# Patient Record
Sex: Male | Born: 1945
Health system: Southern US, Community
[De-identification: ages and names within clinical notes are randomized; demographics above are authoritative.]

## PROBLEM LIST (undated history)

## (undated) DIAGNOSIS — I10 Essential (primary) hypertension: Secondary | ICD-10-CM

## (undated) DIAGNOSIS — D649 Anemia, unspecified: Secondary | ICD-10-CM

## (undated) DIAGNOSIS — E119 Type 2 diabetes mellitus without complications: Secondary | ICD-10-CM

## (undated) DIAGNOSIS — I213 ST elevation (STEMI) myocardial infarction of unspecified site: Secondary | ICD-10-CM

## (undated) DIAGNOSIS — K5790 Diverticulosis of intestine, part unspecified, without perforation or abscess without bleeding: Secondary | ICD-10-CM

## (undated) DIAGNOSIS — E785 Hyperlipidemia, unspecified: Secondary | ICD-10-CM

## (undated) DIAGNOSIS — F17200 Nicotine dependence, unspecified, uncomplicated: Secondary | ICD-10-CM

## (undated) DIAGNOSIS — I219 Acute myocardial infarction, unspecified: Secondary | ICD-10-CM

## (undated) DIAGNOSIS — J449 Chronic obstructive pulmonary disease, unspecified: Secondary | ICD-10-CM

## (undated) DIAGNOSIS — E1169 Type 2 diabetes mellitus with other specified complication: Secondary | ICD-10-CM

## (undated) DIAGNOSIS — K635 Polyp of colon: Secondary | ICD-10-CM

## (undated) HISTORY — DX: Polyp of colon: K63.5

## (undated) HISTORY — DX: Diverticulosis of intestine, part unspecified, without perforation or abscess without bleeding: K57.90

## (undated) HISTORY — DX: ST elevation (STEMI) myocardial infarction of unspecified site: I21.3

## (undated) HISTORY — DX: Anemia, unspecified: D64.9

## (undated) HISTORY — DX: Hyperlipidemia, unspecified: E78.5

## (undated) HISTORY — DX: Nicotine dependence, unspecified, uncomplicated: F17.200

## (undated) HISTORY — DX: Chronic obstructive pulmonary disease, unspecified: J44.9

## (undated) HISTORY — DX: Type 2 diabetes mellitus with other specified complication: E11.69

## (undated) HISTORY — DX: Type 2 diabetes mellitus without complications: E11.9

---

## 2003-02-06 ENCOUNTER — Encounter: Payer: Self-pay | Admitting: Gastroenterology

## 2003-02-06 ENCOUNTER — Ambulatory Visit (HOSPITAL_COMMUNITY): Admission: RE | Admit: 2003-02-06 | Discharge: 2003-02-06 | Payer: Self-pay | Admitting: Gastroenterology

## 2004-06-20 ENCOUNTER — Ambulatory Visit: Payer: Self-pay | Admitting: Family Medicine

## 2004-12-15 ENCOUNTER — Ambulatory Visit: Payer: Self-pay | Admitting: Family Medicine

## 2005-01-01 ENCOUNTER — Ambulatory Visit: Payer: Self-pay | Admitting: Family Medicine

## 2005-01-26 ENCOUNTER — Ambulatory Visit: Payer: Self-pay | Admitting: Family Medicine

## 2005-04-16 ENCOUNTER — Ambulatory Visit: Payer: Self-pay | Admitting: Family Medicine

## 2005-04-27 ENCOUNTER — Ambulatory Visit: Payer: Self-pay | Admitting: Family Medicine

## 2005-05-28 ENCOUNTER — Ambulatory Visit: Payer: Self-pay | Admitting: Family Medicine

## 2005-06-11 ENCOUNTER — Ambulatory Visit: Payer: Self-pay | Admitting: Family Medicine

## 2005-09-17 ENCOUNTER — Ambulatory Visit: Payer: Self-pay | Admitting: Family Medicine

## 2006-02-03 ENCOUNTER — Ambulatory Visit: Payer: Self-pay | Admitting: Gastroenterology

## 2006-02-16 ENCOUNTER — Ambulatory Visit: Payer: Self-pay | Admitting: Gastroenterology

## 2006-02-16 ENCOUNTER — Encounter: Payer: Self-pay | Admitting: Gastroenterology

## 2006-05-28 ENCOUNTER — Ambulatory Visit: Payer: Self-pay | Admitting: Family Medicine

## 2006-06-01 ENCOUNTER — Ambulatory Visit: Payer: Self-pay | Admitting: Family Medicine

## 2006-12-14 ENCOUNTER — Ambulatory Visit: Payer: Self-pay | Admitting: Family Medicine

## 2007-01-06 ENCOUNTER — Ambulatory Visit: Payer: Self-pay | Admitting: Family Medicine

## 2010-05-20 ENCOUNTER — Emergency Department (HOSPITAL_COMMUNITY): Admission: EM | Admit: 2010-05-20 | Discharge: 2010-05-20 | Payer: Self-pay | Admitting: Emergency Medicine

## 2010-10-08 LAB — URINALYSIS, ROUTINE W REFLEX MICROSCOPIC
Bilirubin Urine: NEGATIVE
Glucose, UA: 500 mg/dL — AB
Nitrite: NEGATIVE
Specific Gravity, Urine: 1.02 (ref 1.005–1.030)
pH: 6 (ref 5.0–8.0)

## 2010-10-08 LAB — URINE MICROSCOPIC-ADD ON

## 2011-03-25 ENCOUNTER — Encounter: Payer: Self-pay | Admitting: Gastroenterology

## 2013-05-30 ENCOUNTER — Encounter: Payer: Self-pay | Admitting: *Deleted

## 2013-06-01 ENCOUNTER — Encounter: Payer: Self-pay | Admitting: *Deleted

## 2016-01-24 ENCOUNTER — Encounter: Payer: Self-pay | Admitting: Gastroenterology

## 2016-03-06 DIAGNOSIS — I152 Hypertension secondary to endocrine disorders: Secondary | ICD-10-CM | POA: Insufficient documentation

## 2016-03-06 DIAGNOSIS — F17210 Nicotine dependence, cigarettes, uncomplicated: Secondary | ICD-10-CM | POA: Insufficient documentation

## 2016-03-06 DIAGNOSIS — E1159 Type 2 diabetes mellitus with other circulatory complications: Secondary | ICD-10-CM | POA: Insufficient documentation

## 2016-03-06 DIAGNOSIS — Z87891 Personal history of nicotine dependence: Secondary | ICD-10-CM | POA: Insufficient documentation

## 2016-05-28 ENCOUNTER — Encounter (HOSPITAL_COMMUNITY)
Admission: EM | Disposition: A | Discharge status: Home / Self Care | Payer: Self-pay | Source: Ambulatory Visit | Attending: Internal Medicine

## 2016-05-28 ENCOUNTER — Inpatient Hospital Stay (HOSPITAL_COMMUNITY)
Admission: EM | Admit: 2016-05-28 | Discharge: 2016-05-31 | DRG: 247 | Disposition: A | Discharge status: Home / Self Care | Payer: Medicare Other | Source: Ambulatory Visit | Attending: Internal Medicine | Admitting: Internal Medicine

## 2016-05-28 DIAGNOSIS — I10 Essential (primary) hypertension: Secondary | ICD-10-CM | POA: Diagnosis present

## 2016-05-28 DIAGNOSIS — I2510 Atherosclerotic heart disease of native coronary artery without angina pectoris: Secondary | ICD-10-CM | POA: Diagnosis present

## 2016-05-28 DIAGNOSIS — R001 Bradycardia, unspecified: Secondary | ICD-10-CM | POA: Diagnosis present

## 2016-05-28 DIAGNOSIS — I2111 ST elevation (STEMI) myocardial infarction involving right coronary artery: Secondary | ICD-10-CM

## 2016-05-28 DIAGNOSIS — Z794 Long term (current) use of insulin: Secondary | ICD-10-CM | POA: Diagnosis not present

## 2016-05-28 DIAGNOSIS — I219 Acute myocardial infarction, unspecified: Secondary | ICD-10-CM

## 2016-05-28 DIAGNOSIS — I471 Supraventricular tachycardia: Secondary | ICD-10-CM | POA: Diagnosis present

## 2016-05-28 DIAGNOSIS — I213 ST elevation (STEMI) myocardial infarction of unspecified site: Secondary | ICD-10-CM

## 2016-05-28 DIAGNOSIS — F17210 Nicotine dependence, cigarettes, uncomplicated: Secondary | ICD-10-CM | POA: Diagnosis present

## 2016-05-28 DIAGNOSIS — Z7984 Long term (current) use of oral hypoglycemic drugs: Secondary | ICD-10-CM | POA: Diagnosis not present

## 2016-05-28 DIAGNOSIS — Z23 Encounter for immunization: Secondary | ICD-10-CM | POA: Diagnosis not present

## 2016-05-28 DIAGNOSIS — E785 Hyperlipidemia, unspecified: Secondary | ICD-10-CM | POA: Diagnosis present

## 2016-05-28 DIAGNOSIS — R9431 Abnormal electrocardiogram [ECG] [EKG]: Secondary | ICD-10-CM | POA: Diagnosis not present

## 2016-05-28 DIAGNOSIS — I2119 ST elevation (STEMI) myocardial infarction involving other coronary artery of inferior wall: Principal | ICD-10-CM | POA: Diagnosis present

## 2016-05-28 DIAGNOSIS — I441 Atrioventricular block, second degree: Secondary | ICD-10-CM | POA: Diagnosis present

## 2016-05-28 DIAGNOSIS — E119 Type 2 diabetes mellitus without complications: Secondary | ICD-10-CM | POA: Diagnosis present

## 2016-05-28 DIAGNOSIS — I4430 Unspecified atrioventricular block: Secondary | ICD-10-CM | POA: Diagnosis present

## 2016-05-28 DIAGNOSIS — I252 Old myocardial infarction: Secondary | ICD-10-CM | POA: Diagnosis present

## 2016-05-28 DIAGNOSIS — R0600 Dyspnea, unspecified: Secondary | ICD-10-CM | POA: Diagnosis not present

## 2016-05-28 DIAGNOSIS — J40 Bronchitis, not specified as acute or chronic: Secondary | ICD-10-CM | POA: Diagnosis present

## 2016-05-28 DIAGNOSIS — Z720 Tobacco use: Secondary | ICD-10-CM | POA: Diagnosis not present

## 2016-05-28 DIAGNOSIS — Z955 Presence of coronary angioplasty implant and graft: Secondary | ICD-10-CM

## 2016-05-28 HISTORY — DX: Acute myocardial infarction, unspecified: I21.9

## 2016-05-28 HISTORY — PX: CARDIAC CATHETERIZATION: SHX172

## 2016-05-28 HISTORY — DX: Essential (primary) hypertension: I10

## 2016-05-28 SURGERY — LEFT HEART CATH AND CORONARY ANGIOGRAPHY
Anesthesia: LOCAL

## 2016-05-28 MED ORDER — HEPARIN SODIUM (PORCINE) 1000 UNIT/ML IJ SOLN
INTRAMUSCULAR | Status: AC
  Filled 2016-05-28: qty 1

## 2016-05-28 MED ORDER — SODIUM CHLORIDE 0.9 % IV SOLN
INTRAVENOUS | Status: DC | PRN
  Administered 2016-05-28: 999 mL/h via INTRAVENOUS

## 2016-05-28 MED ORDER — SODIUM CHLORIDE 0.9% FLUSH
3.0000 mL | INTRAVENOUS | Status: DC | PRN
  Administered 2016-05-30 (×2): 10 mL via INTRAVENOUS
  Filled 2016-05-28 (×2): qty 3

## 2016-05-28 MED ORDER — VERAPAMIL HCL 2.5 MG/ML IV SOLN
INTRAVENOUS | Status: DC | PRN
  Administered 2016-05-28: 22:00:00 via INTRA_ARTERIAL

## 2016-05-28 MED ORDER — ONDANSETRON HCL 4 MG/2ML IJ SOLN: 4.0000 mg | Freq: Four times a day (QID) | INTRAMUSCULAR | Status: DC | PRN

## 2016-05-28 MED ORDER — LIDOCAINE HCL (PF) 1 % IJ SOLN
INTRAMUSCULAR | Status: DC | PRN
  Administered 2016-05-28: 2 mL
  Administered 2016-05-28: 10 mL

## 2016-05-28 MED ORDER — HEPARIN SODIUM (PORCINE) 1000 UNIT/ML IJ SOLN
INTRAMUSCULAR | Status: DC | PRN
  Administered 2016-05-28: 7000 [IU] via INTRAVENOUS
  Administered 2016-05-28: 2000 [IU] via INTRAVENOUS

## 2016-05-28 MED ORDER — LIDOCAINE HCL (PF) 1 % IJ SOLN
INTRAMUSCULAR | Status: AC
  Filled 2016-05-28: qty 30

## 2016-05-28 MED ORDER — NITROGLYCERIN 0.4 MG SL SUBL: 0.4000 mg | SUBLINGUAL_TABLET | SUBLINGUAL | Status: DC | PRN

## 2016-05-28 MED ORDER — HEPARIN SODIUM (PORCINE) 5000 UNIT/ML IJ SOLN
5000.0000 [IU] | Freq: Three times a day (TID) | INTRAMUSCULAR | Status: DC
  Administered 2016-05-29 – 2016-05-31 (×7): 5000 [IU] via SUBCUTANEOUS
  Filled 2016-05-28 (×7): qty 1

## 2016-05-28 MED ORDER — HEPARIN (PORCINE) IN NACL 2-0.9 UNIT/ML-% IJ SOLN
INTRAMUSCULAR | Status: AC
  Filled 2016-05-28: qty 1000

## 2016-05-28 MED ORDER — TIROFIBAN HCL IN NACL 5-0.9 MG/100ML-% IV SOLN
INTRAVENOUS | Status: AC
  Filled 2016-05-28: qty 100

## 2016-05-28 MED ORDER — INSULIN ASPART 100 UNIT/ML ~~LOC~~ SOLN
3.0000 [IU] | Freq: Three times a day (TID) | SUBCUTANEOUS | Status: DC
  Administered 2016-05-29 – 2016-05-31 (×6): 3 [IU] via SUBCUTANEOUS

## 2016-05-28 MED ORDER — NITROGLYCERIN 1 MG/10 ML FOR IR/CATH LAB
INTRA_ARTERIAL | Status: AC
  Filled 2016-05-28: qty 10

## 2016-05-28 MED ORDER — TIROFIBAN HCL IN NACL 5-0.9 MG/100ML-% IV SOLN
INTRAVENOUS | Status: DC | PRN
  Administered 2016-05-28: 0.15 ug/kg/min via INTRAVENOUS

## 2016-05-28 MED ORDER — SODIUM CHLORIDE 0.9 % IV SOLN: 250.0000 mL | INTRAVENOUS | Status: DC | PRN

## 2016-05-28 MED ORDER — HEPARIN (PORCINE) IN NACL 2-0.9 UNIT/ML-% IJ SOLN
INTRAMUSCULAR | Status: DC | PRN
  Administered 2016-05-28: 1000 mL

## 2016-05-28 MED ORDER — TICAGRELOR 90 MG PO TABS
90.0000 mg | ORAL_TABLET | Freq: Two times a day (BID) | ORAL | Status: DC
  Administered 2016-05-29 – 2016-05-31 (×5): 90 mg via ORAL
  Filled 2016-05-28 (×5): qty 1

## 2016-05-28 MED ORDER — ACETAMINOPHEN 325 MG PO TABS
650.0000 mg | ORAL_TABLET | ORAL | Status: DC | PRN
Start: 2016-05-28 — End: 2016-05-31
  Administered 2016-05-29: 650 mg via ORAL
  Filled 2016-05-28: qty 2

## 2016-05-28 MED ORDER — VERAPAMIL HCL 2.5 MG/ML IV SOLN
INTRAVENOUS | Status: AC
  Filled 2016-05-28: qty 2

## 2016-05-28 MED ORDER — SODIUM CHLORIDE 0.9 % IV SOLN
INTRAVENOUS | Status: AC
  Administered 2016-05-29: via INTRAVENOUS

## 2016-05-28 MED ORDER — SODIUM CHLORIDE 0.9 % IV SOLN
INTRAVENOUS | Status: DC | PRN
  Administered 2016-05-28: 10 mL/h via INTRAVENOUS

## 2016-05-28 MED ORDER — INSULIN ASPART 100 UNIT/ML ~~LOC~~ SOLN
0.0000 [IU] | Freq: Three times a day (TID) | SUBCUTANEOUS | Status: DC
  Administered 2016-05-29: 2 [IU] via SUBCUTANEOUS
  Administered 2016-05-29: 3 [IU] via SUBCUTANEOUS
  Administered 2016-05-29: 2 [IU] via SUBCUTANEOUS
  Administered 2016-05-30: 3 [IU] via SUBCUTANEOUS
  Administered 2016-05-30: 2 [IU] via SUBCUTANEOUS

## 2016-05-28 MED ORDER — SODIUM CHLORIDE 0.9% FLUSH
3.0000 mL | Freq: Two times a day (BID) | INTRAVENOUS | Status: DC
  Administered 2016-05-29 – 2016-05-31 (×5): 3 mL via INTRAVENOUS

## 2016-05-28 MED ORDER — IOPAMIDOL (ISOVUE-370) INJECTION 76%
INTRAVENOUS | Status: DC | PRN
  Administered 2016-05-28: 150 mL via INTRA_ARTERIAL

## 2016-05-28 MED ORDER — TIROFIBAN (AGGRASTAT) BOLUS VIA INFUSION
INTRAVENOUS | Status: DC | PRN
  Administered 2016-05-28: 2097.5 ug via INTRAVENOUS

## 2016-05-28 MED ORDER — ATORVASTATIN CALCIUM 80 MG PO TABS
80.0000 mg | ORAL_TABLET | Freq: Every day | ORAL | Status: DC
  Administered 2016-05-29 – 2016-05-30 (×2): 80 mg via ORAL
  Filled 2016-05-28 (×2): qty 1

## 2016-05-28 MED ORDER — NITROGLYCERIN 1 MG/10 ML FOR IR/CATH LAB
INTRA_ARTERIAL | Status: DC | PRN
  Administered 2016-05-28: 200 ug via INTRACORONARY

## 2016-05-28 MED ORDER — ASPIRIN EC 81 MG PO TBEC
81.0000 mg | DELAYED_RELEASE_TABLET | Freq: Every day | ORAL | Status: DC
Start: 2016-05-29 — End: 2016-05-31
  Administered 2016-05-29 – 2016-05-31 (×3): 81 mg via ORAL
  Filled 2016-05-28 (×3): qty 1

## 2016-05-28 MED ORDER — TICAGRELOR 90 MG PO TABS
ORAL_TABLET | ORAL | Status: AC
  Filled 2016-05-28: qty 2

## 2016-05-28 MED ORDER — TICAGRELOR 90 MG PO TABS
ORAL_TABLET | ORAL | Status: DC | PRN
  Administered 2016-05-28: 180 mg via ORAL

## 2016-05-28 SURGICAL SUPPLY — 24 items
BALLN EUPHORA RX 2.5X12 (BALLOONS) ×2
BALLN EUPHORA RX 3.0X15 (BALLOONS) ×2
BALLN ~~LOC~~ EUPHORA RX 3.25X20 (BALLOONS) ×2
BALLN ~~LOC~~ EUPHORA RX 3.5X12 (BALLOONS) ×2
BALLOON EUPHORA RX 2.5X12 (BALLOONS) IMPLANT
BALLOON EUPHORA RX 3.0X15 (BALLOONS) IMPLANT
BALLOON ~~LOC~~ EUPHORA RX 3.25X20 (BALLOONS) IMPLANT
BALLOON ~~LOC~~ EUPHORA RX 3.5X12 (BALLOONS) IMPLANT
CATH INFINITI 5 FR JL3.5 (CATHETERS) ×1 IMPLANT
CATH INFINITI 5FR ANG PIGTAIL (CATHETERS) ×1 IMPLANT
DEVICE RAD COMP TR BAND LRG (VASCULAR PRODUCTS) ×1 IMPLANT
GLIDESHEATH SLEND SS 6F .021 (SHEATH) ×1 IMPLANT
GUIDE CATH RUNWAY 6FR FR4 (CATHETERS) ×2 IMPLANT
KIT ENCORE 26 ADVANTAGE (KITS) ×1 IMPLANT
KIT HEART LEFT (KITS) ×2 IMPLANT
PACK CARDIAC CATHETERIZATION (CUSTOM PROCEDURE TRAY) ×2 IMPLANT
SHEATH PINNACLE 6F 10CM (SHEATH) ×1 IMPLANT
STENT PROMUS PREM MR 3.0X38 (Permanent Stent) ×1 IMPLANT
TRANSDUCER W/STOPCOCK (MISCELLANEOUS) ×2 IMPLANT
TUBING CIL FLEX 10 FLL-RA (TUBING) ×2 IMPLANT
WIRE ASAHI PROWATER 180CM (WIRE) ×1 IMPLANT
WIRE HI TORQ VERSACORE-J 145CM (WIRE) ×1 IMPLANT
WIRE RUNTHROUGH .014X180CM (WIRE) ×1 IMPLANT
WIRE SAFE-T 1.5MM-J .035X260CM (WIRE) ×1 IMPLANT

## 2016-05-28 NOTE — Brief Op Note (Signed)
Brief Post-PCI Note (Full report to follow)  Date: 05/28/16 Time: 11:16 PM  Inferior STEMI with thrombotic occlusion of mid RCA.  Non-obstructive CAD in LCA.  Successful PCI to RCA with 3.0 x 38 mm DES  DAPT and transfer to ICU  Nelva Bush, MD [facility] Pager: [phone]

## 2016-05-28 NOTE — H&P (Signed)
History & Physical    Patient ID: Charles Castro MRN: [id number], DOB/AGE: 68-Sep-1947   Admit date: 05/28/2016   Primary Physician: No primary care provider on file. Primary Cardiologist: none  Patient Profile    69 y o man with CP  Past Medical History    DM HTN smoking  Allergies  Allergies not on file  History of Present Illness    Charles Castro is a 69 y o man with a history of DM and IDDM. He developed CP this night about 1 h before calling EMS. He was brought to [facility]. There he complained of acute retrosternal CP. Non radiating. Found to have a  BP in 170's with low pulse in the 40's. ECG with ST elevation in  Inferior leads. STEMI alert to [facility]. HDS on arrival but with brady to 30's. ASA 324 given prior to transfer.     Home Medications   Prior to Admission medications   Medication Sig Start Date End Date Taking? Authorizing Provider  metFORMIN (GLUCOPHAGE-XR) 500 MG 24 hr tablet Take 500-1,000 mg by mouth 2 (two) times daily with a meal. Take 1 tablet (500 mg) with breakfast and 2 tablets (1000 mg) with dinner   Yes Historical Provider, MD   asa Insulin glpizide  Family History    No family history on file.  Social History    Social History   Social History  . Marital status: Married    Spouse name: N/A  . Number of children: N/A  . Years of education: N/A   Occupational History  . Not on file.   Social History Main Topics  . Smoking status: Not on file  . Smokeless tobacco: Not on file  . Alcohol use Not on file  . Drug use: Unknown  . Sexual activity: Not on file   Other Topics Concern  . Not on file   Social History Narrative  . No narrative on file     Review of Systems    General:  No chills, fever, night sweats or weight changes.  Cardiovascular:  No chest pain, dyspnea on exertion, edema, orthopnea, palpitations, paroxysmal nocturnal dyspnea. Dermatological: No rash, lesions/masses Respiratory: No cough,  dyspnea Urologic: No hematuria, dysuria Abdominal:   No nausea, vomiting, diarrhea, bright red blood per rectum, melena, or hematemesis Neurologic:  No visual changes, wkns, changes in mental status. All other systems reviewed and are otherwise negative except as noted above.  Physical Exam    There were no vitals taken for this visit.  General: Pleasant, NAD Psych: Normal affect. Neuro: Alert and oriented X 3. Moves all extremities spontaneously. HEENT: Normal  Neck: Supple without bruits or JVD. Lungs:  Resp regular and unlabored, CTA. Heart: RRR no s3, s4, or murmurs. Abdomen: Soft, non-tender, non-distended, BS + x 4.  Extremities: No clubbing, cyanosis or edema. DP/PT/Radials 2+ and equal bilaterally.  Labs    Troponin (Point of Care Test) No results for input(s): TROPIPOC in the last 72 hours. No results for input(s): CKTOTAL, CKMB, TROPONINI in the last 72 hours. No results found for: WBC, HGB, HCT, MCV, PLT No results for input(s): NA, K, CL, CO2, BUN, CREATININE, CALCIUM, PROT, BILITOT, ALKPHOS, ALT, AST, GLUCOSE in the last 168 hours.  Invalid input(s): LABALBU No results found for: CHOL, HDL, LDLCALC, TRIG No results found for: [facility]   Radiology Studies    No results found.  ECG & Cardiac Imaging    ECG with 2: 1 block with inferior  STEMI  Assessment & Plan    72 y o man with PMH of HTN and DM. Now found to have a STEMI or RCA. The vessel has been stented with DES.   Plan: - ASA 81mg  - Ticagrelor - Start Atorva 80 - Hold metop - Start ACEI in am  - A1c and TFT  Signed, Cristina Gong, MD 05/28/2016, 10:08 PM

## 2016-05-29 ENCOUNTER — Encounter (HOSPITAL_COMMUNITY): Payer: Self-pay | Admitting: Internal Medicine

## 2016-05-29 ENCOUNTER — Inpatient Hospital Stay (HOSPITAL_COMMUNITY): Payer: Medicare Other

## 2016-05-29 DIAGNOSIS — I213 ST elevation (STEMI) myocardial infarction of unspecified site: Secondary | ICD-10-CM

## 2016-05-29 DIAGNOSIS — R0600 Dyspnea, unspecified: Secondary | ICD-10-CM

## 2016-05-29 DIAGNOSIS — E785 Hyperlipidemia, unspecified: Secondary | ICD-10-CM

## 2016-05-29 DIAGNOSIS — R9431 Abnormal electrocardiogram [ECG] [EKG]: Secondary | ICD-10-CM

## 2016-05-29 LAB — COMPREHENSIVE METABOLIC PANEL
ALBUMIN: 3.5 g/dL (ref 3.5–5.0)
ALK PHOS: 61 U/L (ref 38–126)
ALT: 21 U/L (ref 17–63)
AST: 88 U/L — AB (ref 15–41)
Anion gap: 9 (ref 5–15)
BILIRUBIN TOTAL: 0.8 mg/dL (ref 0.3–1.2)
BUN: 11 mg/dL (ref 6–20)
CALCIUM: 8.9 mg/dL (ref 8.9–10.3)
CO2: 24 mmol/L (ref 22–32)
Chloride: 104 mmol/L (ref 101–111)
Creatinine, Ser: 0.85 mg/dL (ref 0.61–1.24)
GFR calc Af Amer: >60 mL/min (ref >60)
GFR calc non Af Amer: >60 mL/min (ref >60)
GLUCOSE: 172 mg/dL — AB (ref 65–99)
POTASSIUM: 4.3 mmol/L (ref 3.5–5.1)
Sodium: 137 mmol/L (ref 135–145)
TOTAL PROTEIN: 6.1 g/dL — AB (ref 6.5–8.1)

## 2016-05-29 LAB — GLUCOSE, CAPILLARY
GLUCOSE-CAPILLARY: 155 mg/dL — AB (ref 65–99)
Glucose-Capillary: 138 mg/dL — ABNORMAL HIGH (ref 65–99)
Glucose-Capillary: 143 mg/dL — ABNORMAL HIGH (ref 65–99)
Glucose-Capillary: 145 mg/dL — ABNORMAL HIGH (ref 65–99)

## 2016-05-29 LAB — CBC
HCT: 39.8 % (ref 39.0–52.0)
HEMOGLOBIN: 13.4 g/dL (ref 13.0–17.0)
MCH: 30.2 pg (ref 26.0–34.0)
MCHC: 33.7 g/dL (ref 30.0–36.0)
MCV: 89.8 fL (ref 78.0–100.0)
Platelets: 212 10*3/uL (ref 150–400)
RBC: 4.43 MIL/uL (ref 4.22–5.81)
RDW: 13.5 % (ref 11.5–15.5)
WBC: 10.1 10*3/uL (ref 4.0–10.5)

## 2016-05-29 LAB — T4, FREE: Free T4: 1.48 ng/dL — ABNORMAL HIGH (ref 0.61–1.12)

## 2016-05-29 LAB — TROPONIN I
TROPONIN I: 43.68 ng/mL — AB (ref <0.03)
Troponin I: 13.21 ng/mL (ref <0.03)
Troponin I: 40.46 ng/mL (ref <0.03)

## 2016-05-29 LAB — BASIC METABOLIC PANEL
ANION GAP: 8 (ref 5–15)
BUN: 10 mg/dL (ref 6–20)
CALCIUM: 8.8 mg/dL — AB (ref 8.9–10.3)
CHLORIDE: 105 mmol/L (ref 101–111)
CO2: 23 mmol/L (ref 22–32)
Creatinine, Ser: 0.66 mg/dL (ref 0.61–1.24)
GFR calc Af Amer: >60 mL/min (ref >60)
GFR calc non Af Amer: >60 mL/min (ref >60)
GLUCOSE: 156 mg/dL — AB (ref 65–99)
Potassium: 4.1 mmol/L (ref 3.5–5.1)
Sodium: 136 mmol/L (ref 135–145)

## 2016-05-29 LAB — POCT ACTIVATED CLOTTING TIME
ACTIVATED CLOTTING TIME: 235 s
Activated Clotting Time: 252 seconds

## 2016-05-29 LAB — MRSA PCR SCREENING: MRSA BY PCR: NEGATIVE

## 2016-05-29 LAB — TSH: TSH: 3.456 u[IU]/mL (ref 0.350–4.500)

## 2016-05-29 LAB — BRAIN NATRIURETIC PEPTIDE: B NATRIURETIC PEPTIDE 5: 38.2 pg/mL (ref 0.0–100.0)

## 2016-05-29 LAB — ECHOCARDIOGRAM COMPLETE

## 2016-05-29 MED ORDER — AZITHROMYCIN 500 MG PO TABS
500.0000 mg | ORAL_TABLET | Freq: Every day | ORAL | Status: AC
  Administered 2016-05-29: 500 mg via ORAL
  Filled 2016-05-29: qty 1

## 2016-05-29 MED ORDER — ATROPINE SULFATE 1 MG/10ML IJ SOSY
PREFILLED_SYRINGE | INTRAMUSCULAR | Status: DC
Start: 2016-05-29 — End: 2016-05-29
  Filled 2016-05-29: qty 10

## 2016-05-29 MED ORDER — BUDESONIDE 0.5 MG/2ML IN SUSP
0.5000 mg | Freq: Two times a day (BID) | RESPIRATORY_TRACT | Status: DC
  Administered 2016-05-29 – 2016-05-31 (×4): 0.5 mg via RESPIRATORY_TRACT
  Filled 2016-05-29 (×4): qty 2

## 2016-05-29 MED ORDER — FUROSEMIDE 10 MG/ML IJ SOLN
40.0000 mg | Freq: Once | INTRAMUSCULAR | Status: AC
  Administered 2016-05-29: 40 mg via INTRAVENOUS
  Filled 2016-05-29: qty 4

## 2016-05-29 MED ORDER — AZITHROMYCIN 250 MG PO TABS
250.0000 mg | ORAL_TABLET | Freq: Every day | ORAL | Status: DC
  Administered 2016-05-30 – 2016-05-31 (×2): 250 mg via ORAL
  Filled 2016-05-29 (×2): qty 1

## 2016-05-29 MED ORDER — ALBUTEROL SULFATE (2.5 MG/3ML) 0.083% IN NEBU
2.5000 mg | INHALATION_SOLUTION | Freq: Four times a day (QID) | RESPIRATORY_TRACT | Status: DC | PRN
  Administered 2016-05-29 – 2016-05-30 (×3): 2.5 mg via RESPIRATORY_TRACT
  Filled 2016-05-29 (×3): qty 3

## 2016-05-29 NOTE — Progress Notes (Signed)
   05/29/16 0200  Clinical Encounter Type  Visited With Family  Visit Type Initial  Referral From Nurse  Spiritual Encounters  Spiritual Needs Other (Comment) (hospitality)  Stress Factors  Patient Stress Factors None identified  Family Stress Factors Exhausted   Code Stemi. Pt taken to Cath lab, chaplain provided ministry of hospitality.

## 2016-05-29 NOTE — Progress Notes (Signed)
Subjective: Pt denies CP  Does Complain of SOB   Objective: Vitals:   05/29/16 0825 05/29/16 0900 05/29/16 1000 05/29/16 1100  BP:  (!) 150/85 (!) 154/79 (!) 143/73  Pulse: (!) 58 (!) 59 73 60  Resp: 20 15 15 16   Temp:      TempSrc:      SpO2: 98% 99% 99% 98%   Weight change:   Intake/Output Summary (Last 24 hours) at 05/29/16 1142 Last data filed at 05/29/16 0900  Gross per 24 hour  Intake              980 ml  Output                0 ml  Net              980 ml    General: Alert, awake, oriented x3, in no acute distress Neck:  JVP is increased   Heart: Regular rate and rhythm, without murmurs, rubs, gallops.  Lungs: Rhonchi and wheezes   Exemities:  No edema.   Neuro: Grossly intact, nonfocal. Tele  SR    Lab Results: Results for orders placed or performed during the hospital encounter of 05/28/16 (from the past 24 hour(s))  MRSA PCR Screening     Status: None   Collection Time: 05/28/16 11:35 PM  Result Value Ref Range   MRSA by PCR NEGATIVE NEGATIVE  Comprehensive metabolic panel     Status: Abnormal   Collection Time: 05/28/16 11:38 PM  Result Value Ref Range   Sodium 137 135 - 145 mmol/L   Potassium 4.3 3.5 - 5.1 mmol/L   Chloride 104 101 - 111 mmol/L   CO2 24 22 - 32 mmol/L   Glucose, Bld 172 (H) 65 - 99 mg/dL   BUN 11 6 - 20 mg/dL   Creatinine, Ser 0.85 0.61 - 1.24 mg/dL   Calcium 8.9 8.9 - 10.3 mg/dL   Total Protein 6.1 (L) 6.5 - 8.1 g/dL   Albumin 3.5 3.5 - 5.0 g/dL   AST 88 (H) 15 - 41 U/L   ALT 21 17 - 63 U/L   Alkaline Phosphatase 61 38 - 126 U/L   Total Bilirubin 0.8 0.3 - 1.2 mg/dL   GFR calc non Af Amer >60 >60 mL/min   GFR calc Af Amer >60 >60 mL/min   Anion gap 9 5 - 15  T4, free     Status: Abnormal   Collection Time: 05/28/16 11:38 PM  Result Value Ref Range   Free T4 1.48 (H) 0.61 - 1.12 ng/dL  TSH     Status: None   Collection Time: 05/28/16 11:38 PM  Result Value Ref Range   TSH 3.456 0.350 - 4.500 uIU/mL  Troponin I      Status: Abnormal   Collection Time: 05/28/16 11:38 PM  Result Value Ref Range   Troponin I 13.21 (HH) <0.03 ng/mL  Brain natriuretic peptide     Status: None   Collection Time: 05/28/16 11:38 PM  Result Value Ref Range   B Natriuretic Peptide 38.2 0.0 - 100.0 pg/mL  Troponin I     Status: Abnormal   Collection Time: 05/29/16  4:42 AM  Result Value Ref Range   Troponin I 43.68 (HH) <0.03 ng/mL  Basic metabolic panel     Status: Abnormal   Collection Time: 05/29/16  4:42 AM  Result Value Ref Range   Sodium 136 135 - 145 mmol/L   Potassium 4.1 3.5 - 5.1  mmol/L   Chloride 105 101 - 111 mmol/L   CO2 23 22 - 32 mmol/L   Glucose, Bld 156 (H) 65 - 99 mg/dL   BUN 10 6 - 20 mg/dL   Creatinine, Ser 0.66 0.61 - 1.24 mg/dL   Calcium 8.8 (L) 8.9 - 10.3 mg/dL   GFR calc non Af Amer >60 >60 mL/min   GFR calc Af Amer >60 >60 mL/min   Anion gap 8 5 - 15  CBC     Status: None   Collection Time: 05/29/16  4:42 AM  Result Value Ref Range   WBC 10.1 4.0 - 10.5 K/uL   RBC 4.43 4.22 - 5.81 MIL/uL   Hemoglobin 13.4 13.0 - 17.0 g/dL   HCT 39.8 39.0 - 52.0 %   MCV 89.8 78.0 - 100.0 fL   MCH 30.2 26.0 - 34.0 pg   MCHC 33.7 30.0 - 36.0 g/dL   RDW 13.5 11.5 - 15.5 %   Platelets 212 150 - 400 K/uL  Glucose, capillary     Status: Abnormal   Collection Time: 05/29/16  7:55 AM  Result Value Ref Range   Glucose-Capillary 155 (H) 65 - 99 mg/dL   Comment 1 Capillary Specimen     Studies/Results: No results found.  Medications:REviewed     @PROBHOSP @  1  STEMI  S/p PCI/DES to RCA    ASA and Brilinta for 1 year    Will give IV lasix x1 and NMT Echo pending    2  Dyspnea  SOme volume increase on exam  Will diurese x 1  3  HL  Started on lipitor  4  DM  Continue insulin in hosp  Takes Metformin at home  Resume at d/c  Cardiac rehab to see  OUt of bed later today then prob tx to floor tomorrow    LOS: 1 day   Dorris Carnes 05/29/2016, 11:42 AM

## 2016-05-29 NOTE — Progress Notes (Signed)
CARDIAC REHAB PHASE I   PRE:  Rate/Rhythm: 65 SR  BP:  Lying: 155/69 Sitting: 143/68        SaO2: 99 3L, 95 RA  MODE:  Ambulation: 100 ft   POST:  Rate/Rhythm: 97 SR  BP:  Sitting: 131/74         SaO2: 28 RA  Pt lives with son who is an EMT, his wife died of cancer one year ago. Pt in bed, c/o dizziness upon sitting, improved with time. Pt c/o shortness of breath, states it comes and goes in waves, suspicious of brilinta reaction. Sats 95% on RA. Pt ambulated 100 ft on RA, handheld assist, gait belt, fairly steady gait, tolerated fair.  Pt c/o DOE, mild dizziness, general fatigue, denies cp, declined rest stop. Completed MI/stent education with pt and sone at bedside.  Reviewed risk factors, tobacco cessation (pt declined fake cigarette, states he is ready to quit cold [place]), MI book, anti-platelet therapy, stent card, activity restrictions, ntg, exercise, heart healthy diet, carb counting, and phase 2 cardiac rehab. Pt verbalized understanding, receptive to education. Pt agrees to phase 2 cardiac rehab referral, will send to [place] per pt request. Pt to recliner after walk, call bell within reach. Will follow-up tomorrow for increased ambulation.   [place], RN, BSN 05/29/2016 2:33 PM

## 2016-05-29 NOTE — Progress Notes (Signed)
Echocardiogram 2D Echocardiogram has been performed.  Aggie Cosier 05/29/2016, 12:03 PM

## 2016-05-30 ENCOUNTER — Encounter (HOSPITAL_COMMUNITY): Payer: Self-pay | Admitting: Emergency Medicine

## 2016-05-30 LAB — GLUCOSE, CAPILLARY
GLUCOSE-CAPILLARY: 79 mg/dL (ref 65–99)
Glucose-Capillary: 151 mg/dL — ABNORMAL HIGH (ref 65–99)
Glucose-Capillary: 129 mg/dL — ABNORMAL HIGH (ref 65–99)
Glucose-Capillary: 68 mg/dL (ref 65–99)

## 2016-05-30 LAB — HEMOGLOBIN A1C
HEMOGLOBIN A1C: 6.4 % — AB (ref 4.8–5.6)
MEAN PLASMA GLUCOSE: 137 mg/dL

## 2016-05-30 MED ORDER — ALBUTEROL SULFATE (2.5 MG/3ML) 0.083% IN NEBU
2.5000 mg | INHALATION_SOLUTION | Freq: Three times a day (TID) | RESPIRATORY_TRACT | Status: DC
  Administered 2016-05-31: 2.5 mg via RESPIRATORY_TRACT
  Filled 2016-05-30: qty 3

## 2016-05-30 MED ORDER — GLIPIZIDE ER 10 MG PO TB24
10.0000 mg | ORAL_TABLET | Freq: Two times a day (BID) | ORAL | Status: DC
  Administered 2016-05-30 – 2016-05-31 (×3): 10 mg via ORAL
  Filled 2016-05-30 (×3): qty 1

## 2016-05-30 MED ORDER — LISINOPRIL 2.5 MG PO TABS
2.5000 mg | ORAL_TABLET | Freq: Every day | ORAL | Status: DC
  Administered 2016-05-30 – 2016-05-31 (×2): 2.5 mg via ORAL
  Filled 2016-05-30 (×2): qty 1

## 2016-05-30 MED ORDER — DIPHENHYDRAMINE HCL 12.5 MG/5ML PO LIQD
12.5000 mg | Freq: Every evening | ORAL | Status: DC | PRN
  Administered 2016-05-30: 12.5 mg via ORAL
  Filled 2016-05-30 (×2): qty 5

## 2016-05-30 MED ORDER — INFLUENZA VAC SPLIT QUAD 0.5 ML IM SUSY: 0.5000 mL | PREFILLED_SYRINGE | INTRAMUSCULAR | Status: DC | PRN

## 2016-05-30 MED ORDER — DOPAMINE-DEXTROSE 3.2-5 MG/ML-% IV SOLN
INTRAVENOUS | Status: AC
  Filled 2016-05-30: qty 250

## 2016-05-30 NOTE — Progress Notes (Signed)
CARDIAC REHAB PHASE I   PRE:  Rate/Rhythm: 76 SR  BP:  Supine:   Sitting:   Standing: 136/76   SaO2: 93% RA  MODE:  Ambulation: 350 ft   POST:  Rate/Rhythm: 83 SR  BP:  Supine:   Sitting: 147/72  Standing:    SaO2: 93% RA  1011-1026 Pt tolerated ambulation well with assist x1, gait steady, no c/o, VSS.  Seward Carol, MS, ACSM CEP

## 2016-05-30 NOTE — Progress Notes (Signed)
Report received in patient's room via Marya Amsler RN using SBAR format, reviewed orders, labs, VS, tests and patient's general condition, assumed care of patient.

## 2016-05-30 NOTE — Progress Notes (Signed)
Patient Name: Charles Castro Date of Encounter: 05/30/2016  Hospital Problem List     Active Problems:   STEMI (ST elevation myocardial infarction) [facility])    Patient Profile     History of DM and tobacco abuse.  Admitted on 10/3 with acute inferior MI.    Subjective   Some mild SOB last night.  He had trouble sleeping.  No chest pain.   Inpatient Medications    . aspirin EC  81 mg Oral Daily  . atorvastatin  80 mg Oral q1800  . azithromycin  250 mg Oral Daily  . budesonide (PULMICORT) nebulizer solution  0.5 mg Nebulization BID  . heparin  5,000 Units Subcutaneous Q8H  . insulin aspart  0-15 Units Subcutaneous TID WC  . insulin aspart  3 Units Subcutaneous TID WC  . sodium chloride flush  3 mL Intravenous Q12H  . ticagrelor  90 mg Oral BID    Vital Signs    Vitals:   05/30/16 0300 05/30/16 0600 05/30/16 0741 05/30/16 0800  BP: 139/79   (!) 144/78  Pulse: (!) 54   66  Resp: 17   17  Temp: 97.4 F (36.3 C)   97.9 F (36.6 C)  TempSrc: Oral   Oral  SpO2: 96%  98% 93%  Weight:  182 lb 15.7 oz (83 kg)    Height:        Intake/Output Summary (Last 24 hours) at 05/30/16 0819 Last data filed at 05/29/16 1800  Gross per 24 hour  Intake              720 ml  Output             1500 ml  Net             -780 ml   Filed Weights   05/29/16 1200 05/30/16 0600  Weight: 180 lb 8 oz (81.9 kg) 182 lb 15.7 oz (83 kg)    Physical Exam    GEN: Well nourished, well developed, in no no acute distress.  Neck: Supple, no JVD, carotid bruits, or masses. Cardiac: RRR, no rubs, or gallops. No clubbing, cyanosis, no edema.  Radials/DP/PT 2+ and equal bilaterally right radial cath site OK.  Respiratory:  Respirations  regular and unlabored, course crackles and decreased breath sounds.   No rales GI: Soft, nontender, nondistended, BS + x 4. Neuro:  Strength and sensation are intact.   Labs    CBC  Recent Labs  05/29/16 0442  WBC 10.1  HGB 13.4  HCT 39.8  MCV 89.8  PLT  99991111   Basic Metabolic Panel  Recent Labs  05/28/16 2338 05/29/16 0442  NA 137 136  K 4.3 4.1  CL 104 105  CO2 24 23  GLUCOSE 172* 156*  BUN 11 10  CREATININE 0.85 0.66  CALCIUM 8.9 8.8*   Liver Function Tests  Recent Labs  05/28/16 2338  AST 88*  ALT 21  ALKPHOS 61  BILITOT 0.8  PROT 6.1*  ALBUMIN 3.5   No results for input(s): LIPASE, AMYLASE in the last 72 hours. Cardiac Enzymes  Recent Labs  05/28/16 2338 05/29/16 0442 05/29/16 1018  TROPONINI 13.21* 43.68* 40.46*   BNP Invalid input(s): POCBNP D-Dimer No results for input(s): DDIMER in the last 72 hours. Hemoglobin A1C  Recent Labs  05/28/16 2338  HGBA1C 6.4*   Fasting Lipid Panel No results for input(s): CHOL, HDL, LDLCALC, TRIG, CHOLHDL, LDLDIRECT in the last 72 hours. Thyroid Function Tests  Recent Labs  05/28/16 2338  TSH 3.456    Telemetry    NSR  ECHO    - Left ventricle: The cavity size was normal. There was mild focal   basal hypertrophy of the septum. Systolic function was normal.   The estimated ejection fraction was in the range of 55% to 60%.   There is akinesis of the basalinferior myocardium. There was an   increased relative contribution of atrial contraction to   ventricular filling. Doppler parameters are consistent with   abnormal left ventricular relaxation (grade 1 diastolic   dysfunction). - Aortic valve: Trileaflet; mildly thickened, mildly calcified   leaflets. - Aorta: Aortic root dimension: 39 mm (ED). - Aortic root: The aortic root was mildly dilated.  Radiology    No results found.  Assessment & Plan    STEMI:   PCI or RCA.  Occluded RCA.   Mild plaque elswhere.  EF is well preserved on echo.  Transfer, ambulate and probably discharge in the AM.   HYPERLIPIDEMIA:  On statin.    DM:  A1C is 6.4.  Restart glipizide.  Restart Metformin at discharge.   HTN:  BP is up.  I will restart his ACE inhibitor.   TOBACCO:  He says that he will not smoke  again!   Signed, Minus Breeding, MD  05/30/2016, 8:19 AM

## 2016-05-31 ENCOUNTER — Other Ambulatory Visit: Payer: Self-pay | Admitting: Physician Assistant

## 2016-05-31 DIAGNOSIS — Z720 Tobacco use: Secondary | ICD-10-CM

## 2016-05-31 DIAGNOSIS — E119 Type 2 diabetes mellitus without complications: Secondary | ICD-10-CM

## 2016-05-31 DIAGNOSIS — J40 Bronchitis, not specified as acute or chronic: Secondary | ICD-10-CM

## 2016-05-31 DIAGNOSIS — I10 Essential (primary) hypertension: Secondary | ICD-10-CM

## 2016-05-31 DIAGNOSIS — I25700 Atherosclerosis of coronary artery bypass graft(s), unspecified, with unstable angina pectoris: Secondary | ICD-10-CM

## 2016-05-31 LAB — GLUCOSE, CAPILLARY
Glucose-Capillary: 118 mg/dL — ABNORMAL HIGH (ref 65–99)
Glucose-Capillary: 68 mg/dL (ref 65–99)

## 2016-05-31 MED ORDER — TICAGRELOR 90 MG PO TABS: 90.0000 mg | ORAL_TABLET | Freq: Two times a day (BID) | ORAL | 11 refills | Status: DC

## 2016-05-31 MED ORDER — ASPIRIN 81 MG PO TBEC: 81.0000 mg | DELAYED_RELEASE_TABLET | Freq: Every day | ORAL | 11 refills | Status: AC

## 2016-05-31 MED ORDER — INFLUENZA VAC SPLIT QUAD 0.5 ML IM SUSY
0.5000 mL | PREFILLED_SYRINGE | INTRAMUSCULAR | Status: AC | PRN
  Administered 2016-05-31: 0.5 mL via INTRAMUSCULAR
  Filled 2016-05-31: qty 0.5

## 2016-05-31 MED ORDER — METOPROLOL TARTRATE 25 MG PO TABS: 12.5000 mg | ORAL_TABLET | Freq: Two times a day (BID) | ORAL | 6 refills | Status: DC

## 2016-05-31 MED ORDER — ATORVASTATIN CALCIUM 80 MG PO TABS: 80.0000 mg | ORAL_TABLET | Freq: Every day | ORAL | 11 refills | Status: DC

## 2016-05-31 MED ORDER — METOPROLOL TARTRATE 12.5 MG HALF TABLET: 12.5000 mg | ORAL_TABLET | Freq: Two times a day (BID) | ORAL | Status: DC

## 2016-05-31 MED ORDER — NITROGLYCERIN 0.4 MG SL SUBL: 0.4000 mg | SUBLINGUAL_TABLET | SUBLINGUAL | 2 refills | Status: DC | PRN

## 2016-05-31 NOTE — Progress Notes (Signed)
Patient Name: Charles Castro Date of Encounter: 05/31/2016  Primary Cardiologist: Dr. Charlynne Cousins Problem List     Active Problems:   STEMI (ST elevation myocardial infarction) ([place])     Subjective   Feels great, standing at sink shaving. No chest pain, no shortness of breath.  Inpatient Medications    Scheduled Meds: . albuterol  2.5 mg Nebulization TID  . aspirin EC  81 mg Oral Daily  . atorvastatin  80 mg Oral q1800  . azithromycin  250 mg Oral Daily  . budesonide (PULMICORT) nebulizer solution  0.5 mg Nebulization BID  . glipiZIDE  10 mg Oral BID  . heparin  5,000 Units Subcutaneous Q8H  . insulin aspart  0-15 Units Subcutaneous TID WC  . insulin aspart  3 Units Subcutaneous TID WC  . lisinopril  2.5 mg Oral Daily  . sodium chloride flush  3 mL Intravenous Q12H  . ticagrelor  90 mg Oral BID   Continuous Infusions:  PRN Meds: sodium chloride, acetaminophen, albuterol, diphenhydrAMINE, Influenza vac split quadrivalent PF, nitroGLYCERIN, ondansetron (ZOFRAN) IV, sodium chloride flush   Vital Signs    Vitals:   05/30/16 1338 05/30/16 1405 05/30/16 2039 05/31/16 0518  BP:  140/60 123/61 120/67  Pulse: 82 80 77 62  Resp: 15 16 18 17   Temp:  97.4 F (36.3 C) 97.7 F (36.5 C) 98.2 F (36.8 C)  TempSrc:  Axillary Oral Oral  SpO2: 96%  92% 94%  Weight:    179 lb 8 oz (81.4 kg)  Height:        Intake/Output Summary (Last 24 hours) at 05/31/16 0831 Last data filed at 05/30/16 2200  Gross per 24 hour  Intake              462 ml  Output              125 ml  Net              337 ml   Filed Weights   05/29/16 1200 05/30/16 0600 05/31/16 0518  Weight: 180 lb 8 oz (81.9 kg) 182 lb 15.7 oz (83 kg) 179 lb 8 oz (81.4 kg)    Physical Exam    GEN: Well nourished, well developed, in no acute distress.  HEENT: Grossly normal.  Neck: Supple, no JVD, carotid bruits, or masses. Cardiac: RRR, no murmurs, rubs, or gallops. No clubbing, cyanosis, edema.   Radials/DP/PT 2+ and equal bilaterally.  Respiratory:  Respirations regular and unlabored,Mild wheeze heard bilaterally, smoker  GI: Soft, nontender, nondistended, BS + x 4. MS: no deformity or atrophy. Skin: warm and dry, no rash. Cardiac catheterization site with minimal bruising. Good distal pulses. Neuro:  Strength and sensation are intact. Psych: AAOx3.  Normal affect.  Labs    CBC  Recent Labs  05/29/16 0442  WBC 10.1  HGB 13.4  HCT 39.8  MCV 89.8  PLT 99991111   Basic Metabolic Panel  Recent Labs  05/28/16 2338 05/29/16 0442  NA 137 136  K 4.3 4.1  CL 104 105  CO2 24 23  GLUCOSE 172* 156*  BUN 11 10  CREATININE 0.85 0.66  CALCIUM 8.9 8.8*   Liver Function Tests  Recent Labs  05/28/16 2338  AST 88*  ALT 21  ALKPHOS 61  BILITOT 0.8  PROT 6.1*  ALBUMIN 3.5   No results for input(s): LIPASE, AMYLASE in the last 72 hours. Cardiac Enzymes  Recent Labs  05/28/16 2338 05/29/16 0442  05/29/16 1018  TROPONINI 13.21* 43.68* 40.46*   BNP Invalid input(s): POCBNP D-Dimer No results for input(s): DDIMER in the last 72 hours. Hemoglobin A1C  Recent Labs  05/28/16 2338  HGBA1C 6.4*   Fasting Lipid Panel No results for input(s): CHOL, HDL, LDLCALC, TRIG, CHOLHDL, LDLDIRECT in the last 72 hours. Thyroid Function Tests  Recent Labs  05/28/16 2338  TSH 3.456    Telemetry    No adverse arrhythmias, heart rate at 5 AM during sleep 50 bpm, normally has been running in the 70s to 80s. Brief paroxysmal atrial tachycardia with heart rates in the 110s to 120s. No further AV block - Personally Reviewed  ECG    On admission, inferior STEMI - Personally Reviewed  Radiology    No results found.  Cardiac Studies   Cath 05/28/16 Conclusions: 1. Significant one vessel coronary artery disease with 60% proximal and 100% mid RCA stenoses. 2. Mild to moderate, nonobstructive CAD involving the LMCA, LAD, LCx, and distal RCA. 3. Normal left ventricular filling  pressure. 4. Successful PCI to proximal and mid RCA with placement of a Promus Premier 3.0 x 38 mm drug-eluting stent (post dilated to 3.5 mm) with 0% residual stenosis and TIMI-3 flow.  Recommendations: 1. Dual antiplatelet therapy with aspirin and ticagrelor for at least 12 months  ECHO 05/29/16: - Left ventricle: The cavity size was normal. There was mild focal   basal hypertrophy of the septum. Systolic function was normal.   The estimated ejection fraction was in the range of 55% to 60%.   There is akinesis of the basalinferior myocardium. There was an   increased relative contribution of atrial contraction to   ventricular filling. Doppler parameters are consistent with   abnormal left ventricular relaxation (grade 1 diastolic   dysfunction). - Aortic valve: Trileaflet; mildly thickened, mildly calcified   leaflets. - Aorta: Aortic root dimension: 39 mm (ED). - Aortic root: The aortic root was mildly dilated   Patient Profile     70 year old male with diabetes, smoker admitted with inferior ST elevation myocardial infarction initial bradycardia.  Assessment & Plan    Inferior ST elevation myocardial infarction (troponin peak of 40)  - Successful long, 38 mm drug-eluting stent to the mid RCA  - Ejection fraction 55-60% with basal inferior akinesis.  - Bradycardia resolved post PCI. Had 2-1 AV block.  - Aggressive secondary prevention including cardiac rehabilitation  - High-dose statin therapy, low-dose metoprolol 12.5 twice a day beta blocker. Initially this was held because of his AV block in the setting of acute MI. Because this has resolved and because he has brief paroxysmal atrial tachycardia I will initiate low-dose beta blocker.  - Dual antiplatelet therapy for one year  - We will check lipid panel as an outpatient  - We changed to high-dose/high-intensity statin atorvastatin.   Diabetes mellitus  - Resume metformin  - Low-dose ACE inhibitor for renal protection   - Hemoglobin A1c 6.4  Tobacco use  - States he will not use cigarettes again. Continue to encourage cessation.  Essential hypertension  - Improved after reinitiation of ACE inhibitor.  - Starting low-dose beta blocker.  Bronchitis  - He has completed azithromycin for full 5 day course  - Pulmicort  - Smoking cessation  Signed, Candee Furbish, MD  05/31/2016, 8:31 AM

## 2016-05-31 NOTE — Discharge Summary (Signed)
Discharge Summary    Patient ID: Charles Castro,  MRN: [id number], DOB/AGE: 1946/06/08 69 y.o.  Admit date: 05/28/2016 Discharge date: 05/31/2016  Primary Care Provider: No primary care provider on file. Primary Cardiologist: New (Dr. Saunders Revel)  Discharge Diagnoses    Active Problems:   STEMI (ST elevation myocardial infarction) ([place])   Tobacco abuse    AV block   DM    Allergies No Known Allergies  Diagnostic Studies/Procedures    Cath 05/28/16 Conclusions: 1. Significant one vessel coronary artery disease with 60% proximal and 100% mid RCA stenoses. 2. Mild to moderate, nonobstructive CAD involving the LMCA, LAD, LCx, and distal RCA. 3. Normal left ventricular filling pressure. 4. Successful PCI to proximal and mid RCA with placement of a Promus Premier 3.0 x 38 mm drug-eluting stent (post dilated to 3.5 mm) with 0% residual stenosis and TIMI-3 flow.  Recommendations: 1. Dual antiplatelet therapy with aspirin and ticagrelor for at least 12 months  ECHO 05/29/16: - Left ventricle: The cavity size was normal. There was mild focal basal hypertrophy of the septum. Systolic function was normal. The estimated ejection fraction was in the range of 55% to 60%. There is akinesis of the basalinferior myocardium. There was an increased relative contribution of atrial contraction to ventricular filling. Doppler parameters are consistent with abnormal left ventricular relaxation (grade 1 diastolic dysfunction). - Aortic valve: Trileaflet; mildly thickened, mildly calcified leaflets. - Aorta: Aortic root dimension: 39 mm (ED). - Aortic root: The aortic root was mildly dilated    History of Present Illness     Charles Castro is a 46 y o man with a history of DM and ongoing tobacco smoking who developed chest pain 05/28/16 and EMS was called. His chest pain was retrosternal non radiating. BP of 170s with pulse of 40s. ECG with ST elevation in  Inferior leads. STEMI alert to  [facility]. HDS on arrival but with brady to 30's. ASA 324 given prior to transfer.   Hospital Course     Consultants: None  1. Inferior STEMI - His Emergent cath showed thrombotic occlusion of mid RCA.  Non-obstructive CAD in LCA. Successful PCI to RCA with 3.0 x 38 mm DES. Peak of troponin 40. Echo showed Ejection fraction 55-60% with basal inferior akinesis. Bradycardia resolved post PCI. Had 2-1 AV block. However had brief paroxysmal atrial tachycardia during admission and started on metoprolol 12.5mg  BID. Stable rate. Ambulated well without chest pain. CRP II at [place]. He was started on high dose statin. Will get lipid panel as outpatient.   2. DM - A1c 6.4. Resume home medications.  3. Tobacco abuse - States he will not use cigarettes again. Continue to encourage cessation.  4. HTN - Improved after initiation of ACE and BB.   5. Bronchitis - Completed azithromycin for full 5 day course. F/u with PCP.   He wishes long term f/u at [place].    The patient has been seen by Dr. Marlou Porch today and deemed ready for discharge home. All follow-up appointments have been scheduled. Discharge medications are listed below.    Discharge Vitals Blood pressure 120/67, pulse 62, temperature 98.2 F (36.8 C), temperature source Oral, resp. rate 17, height 5\' 11"  (1.803 m), weight 179 lb 8 oz (81.4 kg), SpO2 100 %.  Filed Weights   05/29/16 1200 05/30/16 0600 05/31/16 0518  Weight: 180 lb 8 oz (81.9 kg) 182 lb 15.7 oz (83 kg) 179 lb 8 oz (81.4 kg)    Labs & Radiologic  Studies     CBC  Recent Labs  05/29/16 0442  WBC 10.1  HGB 13.4  HCT 39.8  MCV 89.8  PLT 99991111   Basic Metabolic Panel  Recent Labs  05/28/16 2338 05/29/16 0442  NA 137 136  K 4.3 4.1  CL 104 105  CO2 24 23  GLUCOSE 172* 156*  BUN 11 10  CREATININE 0.85 0.66  CALCIUM 8.9 8.8*   Liver Function Tests  Recent Labs  05/28/16 2338  AST 88*  ALT 21  ALKPHOS 61  BILITOT 0.8  PROT 6.1*  ALBUMIN 3.5    No results for input(s): LIPASE, AMYLASE in the last 72 hours. Cardiac Enzymes  Recent Labs  05/28/16 2338 05/29/16 0442 05/29/16 1018  TROPONINI 13.21* 43.68* 40.46*   BNP Invalid input(s): POCBNP D-Dimer No results for input(s): DDIMER in the last 72 hours. Hemoglobin A1C  Recent Labs  05/28/16 2338  HGBA1C 6.4*   Fasting Lipid Panel No results for input(s): CHOL, HDL, LDLCALC, TRIG, CHOLHDL, LDLDIRECT in the last 72 hours. Thyroid Function Tests  Recent Labs  05/28/16 2338  TSH 3.456    No results found.  Disposition   Pt is being discharged home today in good condition.  Follow-up Plans & Appointments    Follow-up Information    [facility] Follow up.   Specialty:  Cardiology Why:  office will call with time and date of appointment Contact information: [address] [place] [address] [phone]       PCP Follow up.   Why:  F/u with PCP for bronichitis/post hosptial          Discharge Instructions    Amb Referral to Cardiac Rehabilitation    Complete by:  As directed    Diagnosis:   Coronary Stents STEMI     Diet - low sodium heart healthy    Complete by:  As directed    Discharge instructions    Complete by:  As directed    No driving for 2 weeks. No lifting over 10 lbs for 4 weeks. No sexual activity for 4 weeks. You may not return to work until cleared by your cardiologist. Keep procedure site clean & dry. If you notice increased pain, swelling, bleeding or pus, call/return!  You may shower, but no soaking baths/hot tubs/pools for 1 week.   Increase activity slowly    Complete by:  As directed       Discharge Medications   Current Discharge Medication List    START taking these medications   Details  aspirin EC 81 MG EC tablet Take 1 tablet (81 mg total) by mouth daily. Qty: 30 tablet, Refills: 11    atorvastatin (LIPITOR) 80 MG tablet Take 1 tablet (80 mg total) by mouth  daily at 6 PM. Qty: 30 tablet, Refills: 11    metoprolol tartrate (LOPRESSOR) 25 MG tablet Take 0.5 tablets (12.5 mg total) by mouth 2 (two) times daily. Qty: 30 tablet, Refills: 6    ticagrelor (BRILINTA) 90 MG TABS tablet Take 1 tablet (90 mg total) by mouth 2 (two) times daily. Qty: 60 tablet, Refills: 11      CONTINUE these medications which have NOT CHANGED   Details  azithromycin (ZITHROMAX) 250 MG tablet Take 250-500 mg by mouth See admin instructions. Started 05/26/16: take 2 tablets (500 mg) by mouth 1st day, then take 1 tablet (250 mg) daily on days 2-5. Refills: 0    Cholecalciferol (VITAMIN D3  PO) Take 1 tablet by mouth at bedtime.    glipiZIDE (GLUCOTROL XL) 10 MG 24 hr tablet Take 10 mg by mouth 2 (two) times daily with a meal.    lisinopril (PRINIVIL,ZESTRIL) 2.5 MG tablet Take 2.5 mg by mouth daily.    metFORMIN (GLUCOPHAGE-XR) 500 MG 24 hr tablet Take 500-1,000 mg by mouth See admin instructions. Take 1 tablet (500 mg) with breakfast and 2 tablets (1000 mg) with supper    Omega-3 Fatty Acids (FISH OIL) 1000 MG CAPS Take 1,000 mg by mouth 2 (two) times daily.    Phenylephrine-APAP-Guaifenesin (MUCINEX FAST-MAX PO) Take 20 mLs by mouth 2 (two) times daily as needed (congestion).      STOP taking these medications     ibuprofen (ADVIL,MOTRIN) 200 MG tablet      simvastatin (ZOCOR) 20 MG tablet          Aspirin prescribed at discharge?  Yes High Intensity Statin Prescribed? (Lipitor 40-80mg  or Crestor 20-40mg ): Yes Beta Blocker Prescribed? Yes For EF 45% or less, Was ACEI/ARB Prescribed? Yes ADP Receptor Inhibitor Prescribed? (i.e. Plavix etc.-Includes Medically Managed Patients): Yes For EF <40%, Aldosterone Inhibitor Prescribed? N/A Was EF assessed during THIS hospitalization? Yes Was Cardiac Rehab II ordered? (Included Medically managed Patients): Yes   Outstanding Labs/Studies   Lipid panel  Duration of Discharge Encounter   Greater than 30 minutes  including physician time.  Signed, Bhagat,Bhavinkumar PA-C 05/31/2016, 9:41 AM  Personally seen and examined. Agree with above.  Primary Cardiologist: Dr. Charlynne Cousins Problem List     Active Problems:   STEMI (ST elevation myocardial infarction) ([place])     Subjective   Feels great, standing at sink shaving. No chest pain, no shortness of breath.  Inpatient Medications    Scheduled Meds: . albuterol  2.5 mg Nebulization TID  . aspirin EC  81 mg Oral Daily  . atorvastatin  80 mg Oral q1800  . azithromycin  250 mg Oral Daily  . budesonide (PULMICORT) nebulizer solution  0.5 mg Nebulization BID  . glipiZIDE  10 mg Oral BID  . heparin  5,000 Units Subcutaneous Q8H  . insulin aspart  0-15 Units Subcutaneous TID WC  . insulin aspart  3 Units Subcutaneous TID WC  . lisinopril  2.5 mg Oral Daily  . sodium chloride flush  3 mL Intravenous Q12H  . ticagrelor  90 mg Oral BID   Continuous Infusions:  PRN Meds: sodium chloride, acetaminophen, albuterol, diphenhydrAMINE, Influenza vac split quadrivalent PF, nitroGLYCERIN, ondansetron (ZOFRAN) IV, sodium chloride flush   Vital Signs          Vitals:   05/30/16 1338 05/30/16 1405 05/30/16 2039 05/31/16 0518  BP:  140/60 123/61 120/67  Pulse: 82 80 77 62  Resp: 15 16 18 17   Temp:  97.4 F (36.3 C) 97.7 F (36.5 C) 98.2 F (36.8 C)  TempSrc:  Axillary Oral Oral  SpO2: 96%  92% 94%  Weight:    179 lb 8 oz (81.4 kg)  Height:        Intake/Output Summary (Last 24 hours) at 05/31/16 0831 Last data filed at 05/30/16 2200  Gross per 24 hour  Intake              462 ml  Output              125 ml  Net              337 ml  Filed Weights   05/29/16 1200 05/30/16 0600 05/31/16 0518  Weight: 180 lb 8 oz (81.9 kg) 182 lb 15.7 oz (83 kg) 179 lb 8 oz (81.4 kg)    Physical Exam    GEN: Well nourished, well developed, in no acute distress.  HEENT: Grossly normal.  Neck: Supple, no  JVD, carotid bruits, or masses. Cardiac: RRR, no murmurs, rubs, or gallops. No clubbing, cyanosis, edema.  Radials/DP/PT 2+ and equal bilaterally.  Respiratory:  Respirations regular and unlabored,Mild wheeze heard bilaterally, smoker  GI: Soft, nontender, nondistended, BS + x 4. MS: no deformity or atrophy. Skin: warm and dry, no rash. Cardiac catheterization site with minimal bruising. Good distal pulses. Neuro:  Strength and sensation are intact. Psych: AAOx3.  Normal affect.  Labs    CBC  Recent Labs (last 2 labs)    Recent Labs  05/29/16 0442  WBC 10.1  HGB 13.4  HCT 39.8  MCV 89.8  PLT 212     Basic Metabolic Panel  Recent Labs (last 2 labs)    Recent Labs  05/28/16 2338 05/29/16 0442  NA 137 136  K 4.3 4.1  CL 104 105  CO2 24 23  GLUCOSE 172* 156*  BUN 11 10  CREATININE 0.85 0.66  CALCIUM 8.9 8.8*     Liver Function Tests  Recent Labs (last 2 labs)    Recent Labs  05/28/16 2338  AST 88*  ALT 21  ALKPHOS 61  BILITOT 0.8  PROT 6.1*  ALBUMIN 3.5     Recent Labs (last 2 labs)   No results for input(s): LIPASE, AMYLASE in the last 72 hours.   Cardiac Enzymes  Recent Labs (last 2 labs)    Recent Labs  05/28/16 2338 05/29/16 0442 05/29/16 1018  TROPONINI 13.21* 43.68* 40.46*     BNP Recent Labs (last 2 labs)   Invalid input(s): POCBNP   D-Dimer Recent Labs (last 2 labs)   No results for input(s): DDIMER in the last 72 hours.   Hemoglobin A1C  Recent Labs (last 2 labs)    Recent Labs  05/28/16 2338  HGBA1C 6.4*     Fasting Lipid Panel Recent Labs (last 2 labs)   No results for input(s): CHOL, HDL, LDLCALC, TRIG, CHOLHDL, LDLDIRECT in the last 72 hours.   Thyroid Function Tests  Recent Labs (last 2 labs)    Recent Labs  05/28/16 2338  TSH 3.456      Telemetry    No adverse arrhythmias, heart rate at 5 AM during sleep 50 bpm, normally has been running in the 70s to 80s. Brief paroxysmal atrial  tachycardia with heart rates in the 110s to 120s. No further AV block - Personally Reviewed  ECG    On admission, inferior STEMI - Personally Reviewed  Radiology    Imaging Results (Last 48 hours)  No results found.    Cardiac Studies   Cath 05/28/16 Conclusions: 1. Significant one vessel coronary artery disease with 60% proximal and 100% mid RCA stenoses. 2. Mild to moderate, nonobstructive CAD involving the LMCA, LAD, LCx, and distal RCA. 3. Normal left ventricular filling pressure. 4. Successful PCI to proximal and mid RCA with placement of a Promus Premier 3.0 x 38 mm drug-eluting stent (post dilated to 3.5 mm) with 0% residual stenosis and TIMI-3 flow.  Recommendations: 1. Dual antiplatelet therapy with aspirin and ticagrelor for at least 12 months  ECHO 05/29/16: - Left ventricle: The cavity size was normal. There was mild focal basal hypertrophy  of the septum. Systolic function was normal. The estimated ejection fraction was in the range of 55% to 60%. There is akinesis of the basalinferior myocardium. There was an increased relative contribution of atrial contraction to ventricular filling. Doppler parameters are consistent with abnormal left ventricular relaxation (grade 1 diastolic dysfunction). - Aortic valve: Trileaflet; mildly thickened, mildly calcified leaflets. - Aorta: Aortic root dimension: 39 mm (ED). - Aortic root: The aortic root was mildly dilated   Patient Profile     70 year old male with diabetes, smoker admitted with inferior ST elevation myocardial infarction initial bradycardia.  Assessment & Plan    Inferior ST elevation myocardial infarction (troponin peak of 40)  - Successful long, 38 mm drug-eluting stent to the mid RCA  - Ejection fraction 55-60% with basal inferior akinesis.  - Bradycardia resolved post PCI. Had 2-1 AV block.  - Aggressive secondary prevention including cardiac rehabilitation  - High-dose  statin therapy, low-dose metoprolol 12.5 twice a day beta blocker. Initially this was held because of his AV block in the setting of acute MI. Because this has resolved and because he has brief paroxysmal atrial tachycardia I will initiate low-dose beta blocker.  - Dual antiplatelet therapy for one year  - We will check lipid panel as an outpatient  - We changed to high-dose/high-intensity statin atorvastatin.   Diabetes mellitus  - Resume metformin  - Low-dose ACE inhibitor for renal protection  - Hemoglobin A1c 6.4  Tobacco use  - States he will not use cigarettes again. Continue to encourage cessation.  Essential hypertension  - Improved after reinitiation of ACE inhibitor.  - Starting low-dose beta blocker.  Bronchitis  - He has completed azithromycin for full 5 day course  - Pulmicort  - Smoking cessation  Signed, Candee Furbish, MD

## 2016-06-05 ENCOUNTER — Telehealth: Payer: Self-pay | Admitting: Internal Medicine

## 2016-06-05 NOTE — Telephone Encounter (Signed)
New Message  TCM appt with Robbie Lis on 06/08/13 @ 9 am from [place] from hospital.

## 2016-06-05 NOTE — Telephone Encounter (Signed)
Patient contacted regarding discharge from [facility] on 05/31/2016.  Patient understands to follow up with provider Robbie Lis on 06/08/2016 at 0900 at [facility] . Patient understands discharge instructions? Yes  Patient understands medications and regiment? Yes  Patient understands to bring all medications to this visit? Yes  Pt states he is doing well at this time. I also reminded him there would be labs drawn at [facility] appt. He voiced understanding and thanks for call.

## 2016-06-06 NOTE — Progress Notes (Signed)
Cardiology Office Note    Date:  06/08/2016   ID:  Charles Castro, DOB 1945-09-16, MRN [id number]  PCP:  Sherrie Mustache, MD  Cardiologist: Dr. Saunders Revel (He likes to f/u at [facility])   Chief Complaint: Hospital follow up s/p STEMI  History of Present Illness:   Charles Castro is a 70 y.o. male with hx of DM and ongoing tobacco abuse who developed chest pain 05/28/16 and EMS was called found to be STEMI with bradycardic to 79s.   His Emergent cath showed thrombotic occlusion of mid RCA s/p  PCI  with 3.0 x 38 mm DES. Mild to moderate, nonobstructive CAD involving the LMCA, LAD, LCx, and distal RCA. Peak of troponin 40. Echo showed Ejection fraction 55-60% with basal inferior akinesis. Bradycardia resolved post PCI. Had 2-1 AV block. However had brief paroxysmal atrial tachycardia during admission and started on metoprolol 12.5mg  BID. Stable rate. His blood pressure was elevated that improved on BB and ACE. He developed cough/bronchitis and improved on Z-pack.   He is here today for f/u. He has quit smoking. Intermittent dyspnea when he walks up in morning that has been improving. Denies chest pain, palpitations, dizziness, orthopnea, PND, syncope, melena or blood in his stool or urine.    Past Medical History:  Diagnosis Date  . DM (diabetes mellitus) ([place])   . HTN (hypertension)   . MI (myocardial infarction) 05/28/2016   DES to RCA    Past Surgical History:  Procedure Laterality Date  . CARDIAC CATHETERIZATION N/A 05/28/2016   Procedure: Left Heart Cath and Coronary Angiography;  Surgeon: Nelva Bush, MD;  Location: [facility];  Service: Cardiovascular;  Laterality: N/A;  . CARDIAC CATHETERIZATION N/A 05/28/2016   Procedure: Coronary Stent Intervention;  Surgeon: Nelva Bush, MD;  Location: [facility];  Service: Cardiovascular;  Laterality: N/A;    Current Medications: Prior to Admission medications   Medication Sig Start Date End Date Taking?  Authorizing Provider  aspirin EC 81 MG EC tablet Take 1 tablet (81 mg total) by mouth daily. 06/01/16   Cynara Tatham, PA  atorvastatin (LIPITOR) 80 MG tablet Take 1 tablet (80 mg total) by mouth daily at 6 PM. 05/31/16   Abdallah Hern, PA  Cholecalciferol (VITAMIN D3 PO) Take 1 tablet by mouth at bedtime.    Historical Provider, MD  glipiZIDE (GLUCOTROL XL) 10 MG 24 hr tablet Take 10 mg by mouth 2 (two) times daily with a meal.    Historical Provider, MD  lisinopril (PRINIVIL,ZESTRIL) 2.5 MG tablet Take 2.5 mg by mouth daily.    Historical Provider, MD  metFORMIN (GLUCOPHAGE-XR) 500 MG 24 hr tablet Take 500-1,000 mg by mouth See admin instructions. Take 1 tablet (500 mg) with breakfast and 2 tablets (1000 mg) with supper    Historical Provider, MD  metoprolol tartrate (LOPRESSOR) 25 MG tablet Take 0.5 tablets (12.5 mg total) by mouth 2 (two) times daily. 05/31/16   Doniqua Saxby, PA  nitroGLYCERIN (NITROSTAT) 0.4 MG SL tablet Place 1 tablet (0.4 mg total) under the tongue every 5 (five) minutes x 3 doses as needed for chest pain. 05/31/16   Erma Heritage, PA  Omega-3 Fatty Acids (FISH OIL) 1000 MG CAPS Take 1,000 mg by mouth 2 (two) times daily.    Historical Provider, MD  Phenylephrine-APAP-Guaifenesin (MUCINEX FAST-MAX PO) Take 20 mLs by mouth 2 (two) times daily as needed (congestion).    Historical Provider, MD  ticagrelor (BRILINTA) 90 MG TABS tablet Take 1  tablet (90 mg total) by mouth 2 (two) times daily. 05/31/16   Leanor Kail, PA    Allergies:   Patient has no known allergies.   Social History   Social History  . Marital status: Married    Spouse name: N/A  . Number of children: N/A  . Years of education: N/A   Social History Main Topics  . Smoking status: Current Every Day Smoker    Packs/day: 1.50    Years: 30.00    Types: Cigarettes  . Smokeless tobacco: Never Used     Comment: "I quite when I came in here"  . Alcohol use No  . Drug use: No  .  Sexual activity: Not Asked   Other Topics Concern  . None   Social History Narrative  . None     Family History:  The patient's family history includes CAD in his brother and father; Diabetes in his brother and father.  Father - Multiple MI, first at age 86s Elder Brother - Multiple MI, first at age 69s Bother has uncontrolled DM   ROS:   Please see the history of present illness.    ROS All other systems reviewed and are negative.   PHYSICAL EXAM:   VS:  BP 128/64   Pulse (!) 51   Ht 5\' 11"  (1.803 m)   Wt 186 lb 12 oz (84.7 kg)   SpO2 96%   BMI 26.05 kg/m    GEN: Well nourished, well developed, in no acute distress  HEENT: normal  Neck: no JVD, carotid bruits, or masses Cardiac: RRR; no murmurs, rubs, or gallops,no edema. R radial cath site stable Respiratory:  clear to auscultation bilaterally, normal work of breathing GI: soft, nontender, nondistended, + BS MS: no deformity or atrophy  Skin: warm and dry, no rash Neuro:  Alert and Oriented x 3, Strength and sensation are intact Psych: euthymic mood, full affect  Wt Readings from Last 3 Encounters:  06/08/16 186 lb 12 oz (84.7 kg)  05/31/16 179 lb 8 oz (81.4 kg)      Studies/Labs Reviewed:   EKG:  EKG is not  ordered today.   Recent Labs: 05/28/2016: ALT 21; B Natriuretic Peptide 38.2; TSH 3.456 05/29/2016: BUN 10; Creatinine, Ser 0.66; Hemoglobin 13.4; Platelets 212; Potassium 4.1; Sodium 136   Lipid Panel No results found for: CHOL, TRIG, HDL, CHOLHDL, VLDL, LDLCALC, LDLDIRECT  Additional studies/ records that were reviewed today include:   Cath 05/28/16 Conclusions: 1. Significant one vessel coronary artery disease with 60% proximal and 100% mid RCA stenoses. 2. Mild to moderate, nonobstructive CAD involving the LMCA, LAD, LCx, and distal RCA. 3. Normal left ventricular filling pressure. 4. Successful PCI to proximal and mid RCAwith placement of a Promus Premier 3.0 x 38 mm drug-eluting stent (post  dilated to 3.5 mm) with 0% residual stenosis and TIMI-3 flow.  Recommendations: 1. Dual antiplatelet therapy with aspirin and ticagrelor for at least 12 months  ECHO 05/29/16: - Left ventricle: The cavity size was normal. There was mild focal basal hypertrophy of the septum. Systolic function was normal. The estimated ejection fraction was in the range of 55% to 60%. There isakinesis of the basalinferior myocardium.There was an increased relative contribution of atrial contraction to ventricular filling. Doppler parameters are consistent with abnormal left ventricular relaxation (grade 1 diastolic dysfunction). - Aortic valve: Trileaflet; mildly thickened, mildly calcified leaflets. - Aorta: Aortic root dimension: 29mm (ED). - Aortic root: The aortic root was mildly dilated  ASSESSMENT & PLAN:    1. CAD/STEMI - S/p DES to RCA. Non-obstructive CAD as described above. No anginal pain. Intermittent dyspnea that has been improving. He will let us know if worsen. He is not in HF. CRP II and cardiac care f/u @ [place] as he lives there. Continue ASA, Brillinta, BB, satin and ACE.  - Discussed HF symptoms in detailed. Echo with Ejection fraction 55-60% with basal inferior akinesis.  2. HTN  - Stable and well controlled.   3. HLD - Will get lipid panel today. Continue lipitor.   4. Tobacco abuse - He has quit. Congratulated.   5. Bronchitis - Finished course of Z-pack. Resolved. Will f/u with PCP this week.   6. DM - A1c of 6.4.     Medication Adjustments/Labs and Tests Ordered: Current medicines are reviewed at length with the patient today.  Concerns regarding medicines are outlined above.  Medication changes, Labs and Tests ordered today are listed in the Patient Instructions below. Patient Instructions  Your physician recommends that you continue on your current medications as directed. Please refer to the Current Medication list given to you  today.  Your physician recommends that you return for lab work in:  [place] physician recommends that you schedule a follow-up appointment in: 2-3  MONTHS  IN [place]      Signed, [place], [place]  06/08/2016 9:04 AM    [address], [place], [place]  [address] Phone: [phone]; Fax: [phone]

## 2016-06-08 ENCOUNTER — Other Ambulatory Visit: Payer: Medicare Other | Admitting: *Deleted

## 2016-06-08 ENCOUNTER — Ambulatory Visit (INDEPENDENT_AMBULATORY_CARE_PROVIDER_SITE_OTHER): Payer: Medicare Other | Admitting: Physician Assistant

## 2016-06-08 ENCOUNTER — Encounter: Payer: Self-pay | Admitting: Physician Assistant

## 2016-06-08 VITALS — BP 128/64 | HR 51 | Ht 71.0 in | Wt 186.8 lb

## 2016-06-08 DIAGNOSIS — E119 Type 2 diabetes mellitus without complications: Secondary | ICD-10-CM | POA: Insufficient documentation

## 2016-06-08 DIAGNOSIS — E785 Hyperlipidemia, unspecified: Secondary | ICD-10-CM | POA: Diagnosis not present

## 2016-06-08 DIAGNOSIS — E109 Type 1 diabetes mellitus without complications: Secondary | ICD-10-CM | POA: Diagnosis not present

## 2016-06-08 DIAGNOSIS — I10 Essential (primary) hypertension: Secondary | ICD-10-CM | POA: Diagnosis not present

## 2016-06-08 DIAGNOSIS — I25119 Atherosclerotic heart disease of native coronary artery with unspecified angina pectoris: Secondary | ICD-10-CM

## 2016-06-08 DIAGNOSIS — I25700 Atherosclerosis of coronary artery bypass graft(s), unspecified, with unstable angina pectoris: Secondary | ICD-10-CM

## 2016-06-08 DIAGNOSIS — E139 Other specified diabetes mellitus without complications: Secondary | ICD-10-CM

## 2016-06-08 LAB — LIPID PANEL
CHOL/HDL RATIO: 2 ratio (ref <5.0)
Cholesterol: 57 mg/dL (ref <200)
HDL: 28 mg/dL — AB (ref >40)
LDL CALC: 18 mg/dL (ref <100)
TRIGLYCERIDES: 56 mg/dL (ref <150)
VLDL: 11 mg/dL (ref <30)

## 2016-06-08 NOTE — Patient Instructions (Signed)
Your physician recommends that you continue on your current medications as directed. Please refer to the Current Medication list given to you today.  Your physician recommends that you return for lab work in:  [place] physician recommends that you schedule a follow-up appointment in: 2-3  MONTHS  IN [place]

## 2016-06-15 ENCOUNTER — Telehealth: Payer: Self-pay | Admitting: Physician Assistant

## 2016-06-15 NOTE — Telephone Encounter (Signed)
Not sure what medication this would be related to.  The patient can try taking famotidine 20 mg BID to see if this helps his symptoms.  Otherwise, we should try to get him in to see me or one of the PA's in the next week.  Thanks.  Gerald Stabs

## 2016-06-15 NOTE — Telephone Encounter (Signed)
Left message to call back  

## 2016-06-15 NOTE — Telephone Encounter (Signed)
I spoke with pt's son.  He reports pt has been having burning epigastric pain since MI.  Started in hospital but is getting worse.  Has been waking pt up at night.  Pt is watching diet but notices reflux symptoms worse after eating anything at all greasy.  Pt has been taking TUMS frequently but getting little relief.  Son is a paramedic and states burning pain is different that pain pt had with MI.  He feels it is reflux. Will forward to Dr. Saunders Revel for review/recommendations.

## 2016-06-15 NOTE — Telephone Encounter (Signed)
New Message:   Pt have been suffering with GERD,does not know if the medicine is causing it or not. It have been worse since he have come from the hospital.Please call to advise.

## 2016-06-16 NOTE — Telephone Encounter (Signed)
I discussed Dr Darnelle Bos recommendations with Edd Arbour, pt's son.  Edd Arbour states he will discuss with his father, will probably try famotidine 20mg  bid. Ronnie advised if symptoms do not  improve, to contact the [employer] (pt prefers to follow up in [place])  for sooner follow up. Ronnie agrees and is comfortable with this plan.

## 2016-06-16 NOTE — Telephone Encounter (Signed)
I spoke with pt's son, Edd Arbour.  Ronnie states pt's symptoms are unchanged since yesterday.

## 2016-06-25 ENCOUNTER — Encounter (HOSPITAL_COMMUNITY): Admission: RE | Admit: 2016-06-25 | Payer: Medicare Other | Source: Ambulatory Visit

## 2016-08-11 DIAGNOSIS — R799 Abnormal finding of blood chemistry, unspecified: Secondary | ICD-10-CM | POA: Diagnosis not present

## 2016-08-19 DIAGNOSIS — I2510 Atherosclerotic heart disease of native coronary artery without angina pectoris: Secondary | ICD-10-CM | POA: Diagnosis not present

## 2016-08-19 DIAGNOSIS — I10 Essential (primary) hypertension: Secondary | ICD-10-CM | POA: Diagnosis not present

## 2016-08-19 DIAGNOSIS — Z1159 Encounter for screening for other viral diseases: Secondary | ICD-10-CM | POA: Diagnosis not present

## 2016-08-19 DIAGNOSIS — J449 Chronic obstructive pulmonary disease, unspecified: Secondary | ICD-10-CM | POA: Diagnosis not present

## 2016-08-19 DIAGNOSIS — E785 Hyperlipidemia, unspecified: Secondary | ICD-10-CM | POA: Diagnosis not present

## 2016-08-19 DIAGNOSIS — Z1211 Encounter for screening for malignant neoplasm of colon: Secondary | ICD-10-CM | POA: Diagnosis not present

## 2016-08-19 DIAGNOSIS — Z23 Encounter for immunization: Secondary | ICD-10-CM | POA: Diagnosis not present

## 2016-08-24 DIAGNOSIS — Z1211 Encounter for screening for malignant neoplasm of colon: Secondary | ICD-10-CM | POA: Diagnosis not present

## 2016-08-24 DIAGNOSIS — Z1159 Encounter for screening for other viral diseases: Secondary | ICD-10-CM | POA: Diagnosis not present

## 2016-08-30 NOTE — Progress Notes (Signed)
Cardiology Office Note    Date:  09/01/2016   ID:  [name], DOB [birth year], MRN [id number]  PCP:  Sherrie Mustache, MD  Cardiologist: Dr. Saunders Revel (He likes to f/u at [facility])   Chief Complaint: Hospital follow up s/p STEMI  History of Present Illness:   Charles Castro is a 71 y.o. male with hx of DM and ongoing tobacco abuse who developed chest pain 05/28/16 and EMS was called found to be STEMI with bradycardic to 4s.   His Emergent cath showed thrombotic occlusion of mid RCA s/p  PCI  with 3.0 x 38 mm DES. Mild to moderate, nonobstructive CAD involving the LMCA, LAD, LCx, and distal RCA. Peak of troponin 40. Echo showed Ejection fraction 55-60% with basal inferior akinesis. Bradycardia resolved post PCI. Had 2-1 AV block. However had brief paroxysmal atrial tachycardia during admission and started on metoprolol 12.5mg  BID. Stable rate. His blood pressure was elevated that improved on BB and ACE. He developed cough/bronchitis and improved on Z-pack.   He is here today for f/u. He has quit smoking. Intermittent dyspnea when he walks up in morning that has been improving. Denies chest pain, palpitations, dizziness, orthopnea, PND, syncope, melena or blood in his stool or urine.   Has had GERD and has been taking famotidine 20 mg bid  Past Medical History:  Diagnosis Date  . DM (diabetes mellitus) ([place])   . HTN (hypertension)   . MI (myocardial infarction) 05/28/2016   DES to RCA    Past Surgical History:  Procedure Laterality Date  . CARDIAC CATHETERIZATION N/A 05/28/2016   Procedure: Left Heart Cath and Coronary Angiography;  Surgeon: Nelva Bush, MD;  Location: [place] [facility];  Service: Cardiovascular;  Laterality: N/A;  . CARDIAC CATHETERIZATION N/A 05/28/2016   Procedure: Coronary Stent Intervention;  Surgeon: Nelva Bush, MD;  Location: [place] [facility];  Service: Cardiovascular;  Laterality: N/A;    Current Medications: Prior to Admission medications     Medication Sig Start Date End Date Taking? Authorizing Provider  aspirin EC 81 MG EC tablet Take 1 tablet (81 mg total) by mouth daily. 06/01/16   Bhavinkumar Bhagat, PA  atorvastatin (LIPITOR) 80 MG tablet Take 1 tablet (80 mg total) by mouth daily at 6 PM. 05/31/16   Bhavinkumar Bhagat, PA  Cholecalciferol (VITAMIN D3 PO) Take 1 tablet by mouth at bedtime.    Historical Provider, MD  glipiZIDE (GLUCOTROL XL) 10 MG 24 hr tablet Take 10 mg by mouth 2 (two) times daily with a meal.    Historical Provider, MD  lisinopril (PRINIVIL,ZESTRIL) 2.5 MG tablet Take 2.5 mg by mouth daily.    Historical Provider, MD  metFORMIN (GLUCOPHAGE-XR) 500 MG 24 hr tablet Take 500-1,000 mg by mouth See admin instructions. Take 1 tablet (500 mg) with breakfast and 2 tablets (1000 mg) with supper    Historical Provider, MD  metoprolol tartrate (LOPRESSOR) 25 MG tablet Take 0.5 tablets (12.5 mg total) by mouth 2 (two) times daily. 05/31/16   Bhavinkumar Bhagat, PA  nitroGLYCERIN (NITROSTAT) 0.4 MG SL tablet Place 1 tablet (0.4 mg total) under the tongue every 5 (five) minutes x 3 doses as needed for chest pain. 05/31/16   Erma Heritage, PA  Omega-3 Fatty Acids (FISH OIL) 1000 MG CAPS Take 1,000 mg by mouth 2 (two) times daily.    Historical Provider, MD  Phenylephrine-APAP-Guaifenesin (MUCINEX FAST-MAX PO) Take 20 mLs by mouth 2 (two) times daily as needed (congestion).  Historical Provider, MD  ticagrelor (BRILINTA) 90 MG TABS tablet Take 1 tablet (90 mg total) by mouth 2 (two) times daily. 05/31/16   Leanor Kail, PA    Allergies:   Patient has no known allergies.   Social History   Social History  . Marital status: Widowed    Spouse name: N/A  . Number of children: N/A  . Years of education: N/A   Social History Main Topics  . Smoking status: Current Every Day Smoker    Packs/day: 1.50    Years: 30.00    Types: Cigarettes  . Smokeless tobacco: Never Used     Comment: "I quite when I came in here"   . Alcohol use No  . Drug use: No  . Sexual activity: Not Asked   Other Topics Concern  . None   Social History Narrative  . None     Family History:  The patient's family history includes CAD in his brother and father; Diabetes in his brother and father.  Father - Multiple MI, first at age 35s Elder Brother - Multiple MI, first at age 5s Bother has uncontrolled DM   ROS:   Please see the history of present illness.    ROS All other systems reviewed and are negative.   PHYSICAL EXAM:   VS:  BP 140/70   Pulse 62   Ht 5\' 11"  (1.803 m)   Wt 187 lb (84.8 kg)   SpO2 95%   BMI 26.08 kg/m    GEN: Well nourished, well developed, in no acute distress  HEENT: normal  Neck: no JVD, right  carotid bruits, or masses Cardiac: RRR; no murmurs, rubs, or gallops,no edema. R radial cath site stable Respiratory: rhonchi and exp wheezing  GI: soft, nontender, nondistended, + BS MS: no deformity or atrophy  Skin: warm and dry, no rash Neuro:  Alert and Oriented x 3, Strength and sensation are intact Psych: euthymic mood, full affect  Wt Readings from Last 3 Encounters:  09/01/16 187 lb (84.8 kg)  06/08/16 186 lb 12 oz (84.7 kg)  05/31/16 179 lb 8 oz (81.4 kg)      Studies/Labs Reviewed:   EKG:  EKG is not  ordered today.   Recent Labs: 05/28/2016: ALT 21; B Natriuretic Peptide 38.2; TSH 3.456 05/29/2016: BUN 10; Creatinine, Ser 0.66; Hemoglobin 13.4; Platelets 212; Potassium 4.1; Sodium 136   Lipid Panel    Component Value Date/Time   CHOL 57 06/08/2016 0827   TRIG 56 06/08/2016 0827   HDL 28 (L) 06/08/2016 0827   CHOLHDL 2.0 06/08/2016 0827   VLDL 11 06/08/2016 0827   LDLCALC 18 06/08/2016 0827    Additional studies/ records that were reviewed today include:   Cath 05/28/16 Conclusions: 1. Significant one vessel coronary artery disease with 60% proximal and 100% mid RCA stenoses. 2. Mild to moderate, nonobstructive CAD involving the LMCA, LAD, LCx, and distal  RCA. 3. Normal left ventricular filling pressure. 4. Successful PCI to proximal and mid RCAwith placement of a Promus Premier 3.0 x 38 mm drug-eluting stent (post dilated to 3.5 mm) with 0% residual stenosis and TIMI-3 flow.  Recommendations: 1. Dual antiplatelet therapy with aspirin and ticagrelor for at least 12 months  ECHO 05/29/16: - Left ventricle: The cavity size was normal. There was mild focal basal hypertrophy of the septum. Systolic function was normal. The estimated ejection fraction was in the range of 55% to 60%. There isakinesis of the basalinferior myocardium.There was an increased relative contribution  of atrial contraction to ventricular filling. Doppler parameters are consistent with abnormal left ventricular relaxation (grade 1 diastolic dysfunction). - Aortic valve: Trileaflet; mildly thickened, mildly calcified leaflets. - Aorta: Aortic root dimension: 60mm (ED). - Aortic root: The aortic root was mildly dilated     ASSESSMENT & PLAN:    1. CAD/STEMI - S/p DES to RCA.05/28/16  Non-obstructive CAD left system  as described above. No anginal pain. Intermittent dyspnea  Persists stop Brilinta and start Effient   Continue ASA, Brillinta, BB, satin and ACE.  - Discussed HF symptoms in detailed. Echo with Ejection fraction 55-60% with basal inferior akinesis.  2. HTN  - Stable and well controlled.   3. HLD -  Continue lipitor.   4. Tobacco abuse - still with occasional cigs refer to [facility] COPD contributing to dyspnea has inhalers   5. GERD:  Improved with famotidine   6. DM - A1c of 6.4.   7. Bruit:  Right f/u carotid duplex ASA/Statin     Carotid Refer Luan Pulling COPD Change Brilinta to Effient F/U 6 months    Jenkins Rouge

## 2016-09-01 ENCOUNTER — Encounter: Payer: Self-pay | Admitting: Cardiovascular Disease

## 2016-09-01 ENCOUNTER — Ambulatory Visit (INDEPENDENT_AMBULATORY_CARE_PROVIDER_SITE_OTHER): Payer: Medicare HMO | Admitting: Cardiovascular Disease

## 2016-09-01 VITALS — BP 140/70 | HR 62 | Ht 71.0 in | Wt 187.0 lb

## 2016-09-01 DIAGNOSIS — J449 Chronic obstructive pulmonary disease, unspecified: Secondary | ICD-10-CM | POA: Diagnosis not present

## 2016-09-01 DIAGNOSIS — R0989 Other specified symptoms and signs involving the circulatory and respiratory systems: Secondary | ICD-10-CM | POA: Diagnosis not present

## 2016-09-01 MED ORDER — PRASUGREL HCL 10 MG PO TABS: 10.0000 mg | ORAL_TABLET | Freq: Every day | ORAL | 3 refills | Status: AC

## 2016-09-01 NOTE — Patient Instructions (Addendum)
Medication Instructions:  STOP BRILINTA START EFFIENT 10 MG - ONCE DAILY   Labwork: NONE  Testing/Procedures: Your physician has requested that you have a carotid duplex. This test is an ultrasound of the carotid arteries in your neck. It looks at blood flow through these arteries that supply the brain with blood. Allow one hour for this exam. There are no restrictions or special instructions.  You have been referred to DR. HAWKINS for COPD   Follow-Up: Your physician wants you to follow-up in: 6 MONTHS .  You will receive a reminder letter in the mail two months in advance. If you don't receive a letter, please call our office to schedule the follow-up appointment.   Any Other Special Instructions Will Be Listed Below (If Applicable).  YOU HAVE    If you need a refill on your cardiac medications before your next appointment, please call your pharmacy.

## 2016-09-03 ENCOUNTER — Ambulatory Visit (HOSPITAL_COMMUNITY)
Admission: RE | Admit: 2016-09-03 | Discharge: 2016-09-03 | Disposition: A | Discharge status: Home / Self Care | Payer: Medicare HMO | Source: Ambulatory Visit | Attending: Cardiovascular Disease | Admitting: Cardiovascular Disease

## 2016-09-03 DIAGNOSIS — I6523 Occlusion and stenosis of bilateral carotid arteries: Secondary | ICD-10-CM | POA: Diagnosis not present

## 2016-09-03 DIAGNOSIS — R0989 Other specified symptoms and signs involving the circulatory and respiratory systems: Secondary | ICD-10-CM | POA: Diagnosis not present

## 2016-10-07 DIAGNOSIS — I2510 Atherosclerotic heart disease of native coronary artery without angina pectoris: Secondary | ICD-10-CM | POA: Diagnosis not present

## 2016-10-07 DIAGNOSIS — J449 Chronic obstructive pulmonary disease, unspecified: Secondary | ICD-10-CM | POA: Diagnosis not present

## 2016-10-07 DIAGNOSIS — I10 Essential (primary) hypertension: Secondary | ICD-10-CM | POA: Diagnosis not present

## 2016-10-20 DIAGNOSIS — E784 Other hyperlipidemia: Secondary | ICD-10-CM | POA: Diagnosis not present

## 2016-10-20 DIAGNOSIS — R799 Abnormal finding of blood chemistry, unspecified: Secondary | ICD-10-CM | POA: Diagnosis not present

## 2016-10-20 DIAGNOSIS — R7309 Other abnormal glucose: Secondary | ICD-10-CM | POA: Diagnosis not present

## 2016-10-26 DIAGNOSIS — E1159 Type 2 diabetes mellitus with other circulatory complications: Secondary | ICD-10-CM | POA: Diagnosis not present

## 2016-10-26 DIAGNOSIS — J449 Chronic obstructive pulmonary disease, unspecified: Secondary | ICD-10-CM | POA: Diagnosis not present

## 2016-10-26 DIAGNOSIS — I2510 Atherosclerotic heart disease of native coronary artery without angina pectoris: Secondary | ICD-10-CM | POA: Diagnosis not present

## 2016-10-26 DIAGNOSIS — I10 Essential (primary) hypertension: Secondary | ICD-10-CM | POA: Diagnosis not present

## 2016-11-23 DIAGNOSIS — Z7984 Long term (current) use of oral hypoglycemic drugs: Secondary | ICD-10-CM | POA: Diagnosis not present

## 2016-11-23 DIAGNOSIS — Z6825 Body mass index (BMI) 25.0-25.9, adult: Secondary | ICD-10-CM | POA: Diagnosis not present

## 2016-11-23 DIAGNOSIS — R69 Illness, unspecified: Secondary | ICD-10-CM | POA: Diagnosis not present

## 2016-11-23 DIAGNOSIS — Z955 Presence of coronary angioplasty implant and graft: Secondary | ICD-10-CM | POA: Diagnosis not present

## 2016-11-23 DIAGNOSIS — I10 Essential (primary) hypertension: Secondary | ICD-10-CM | POA: Diagnosis not present

## 2016-11-23 DIAGNOSIS — E7800 Pure hypercholesterolemia, unspecified: Secondary | ICD-10-CM | POA: Diagnosis not present

## 2016-11-23 DIAGNOSIS — Z7982 Long term (current) use of aspirin: Secondary | ICD-10-CM | POA: Diagnosis not present

## 2016-11-23 DIAGNOSIS — I259 Chronic ischemic heart disease, unspecified: Secondary | ICD-10-CM | POA: Diagnosis not present

## 2016-11-23 DIAGNOSIS — E119 Type 2 diabetes mellitus without complications: Secondary | ICD-10-CM | POA: Diagnosis not present

## 2016-11-23 DIAGNOSIS — Z0000 Encounter for general adult medical examination without abnormal findings: Secondary | ICD-10-CM | POA: Diagnosis not present

## 2016-12-17 DIAGNOSIS — J209 Acute bronchitis, unspecified: Secondary | ICD-10-CM | POA: Diagnosis not present

## 2016-12-17 DIAGNOSIS — I2510 Atherosclerotic heart disease of native coronary artery without angina pectoris: Secondary | ICD-10-CM | POA: Diagnosis not present

## 2016-12-17 DIAGNOSIS — J0100 Acute maxillary sinusitis, unspecified: Secondary | ICD-10-CM | POA: Diagnosis not present

## 2016-12-17 DIAGNOSIS — I10 Essential (primary) hypertension: Secondary | ICD-10-CM | POA: Diagnosis not present

## 2016-12-17 DIAGNOSIS — E119 Type 2 diabetes mellitus without complications: Secondary | ICD-10-CM | POA: Diagnosis not present

## 2016-12-25 DIAGNOSIS — I2510 Atherosclerotic heart disease of native coronary artery without angina pectoris: Secondary | ICD-10-CM | POA: Diagnosis not present

## 2016-12-25 DIAGNOSIS — J019 Acute sinusitis, unspecified: Secondary | ICD-10-CM | POA: Diagnosis not present

## 2016-12-25 DIAGNOSIS — I10 Essential (primary) hypertension: Secondary | ICD-10-CM | POA: Diagnosis not present

## 2017-02-05 DIAGNOSIS — I2510 Atherosclerotic heart disease of native coronary artery without angina pectoris: Secondary | ICD-10-CM | POA: Diagnosis not present

## 2017-02-05 DIAGNOSIS — J449 Chronic obstructive pulmonary disease, unspecified: Secondary | ICD-10-CM | POA: Diagnosis not present

## 2017-02-05 DIAGNOSIS — Z125 Encounter for screening for malignant neoplasm of prostate: Secondary | ICD-10-CM | POA: Diagnosis not present

## 2017-02-05 DIAGNOSIS — E1159 Type 2 diabetes mellitus with other circulatory complications: Secondary | ICD-10-CM | POA: Diagnosis not present

## 2017-02-05 DIAGNOSIS — E1169 Type 2 diabetes mellitus with other specified complication: Secondary | ICD-10-CM | POA: Diagnosis not present

## 2017-02-05 DIAGNOSIS — I10 Essential (primary) hypertension: Secondary | ICD-10-CM | POA: Diagnosis not present

## 2017-02-10 DIAGNOSIS — E1169 Type 2 diabetes mellitus with other specified complication: Secondary | ICD-10-CM | POA: Diagnosis not present

## 2017-02-10 DIAGNOSIS — E119 Type 2 diabetes mellitus without complications: Secondary | ICD-10-CM | POA: Diagnosis not present

## 2017-02-10 DIAGNOSIS — I10 Essential (primary) hypertension: Secondary | ICD-10-CM | POA: Diagnosis not present

## 2017-02-10 DIAGNOSIS — E785 Hyperlipidemia, unspecified: Secondary | ICD-10-CM | POA: Diagnosis not present

## 2017-02-10 DIAGNOSIS — E1159 Type 2 diabetes mellitus with other circulatory complications: Secondary | ICD-10-CM | POA: Diagnosis not present

## 2017-02-10 DIAGNOSIS — J449 Chronic obstructive pulmonary disease, unspecified: Secondary | ICD-10-CM | POA: Diagnosis not present

## 2017-02-10 DIAGNOSIS — I2510 Atherosclerotic heart disease of native coronary artery without angina pectoris: Secondary | ICD-10-CM | POA: Diagnosis not present

## 2017-02-26 NOTE — Progress Notes (Signed)
Cardiology Office Note    Date:  03/01/2017   ID:  PIOTR CHRISTOPHER, DOB 1946-04-19, MRN [id number]  PCP:  Dione Housekeeper, MD  Cardiologist: Dr. Saunders Revel (He likes to f/u at [facility])   Chief Complaint: Hospital follow up s/p STEMI  History of Present Illness:   JODEN BONSALL is a 71 y.o. male with hx of DM and ongoing tobacco abuse who developed chest pain 05/28/16 and EMS was called found to be STEMI with bradycardic to 31s.   His Emergent cath showed thrombotic occlusion of mid RCA s/p  PCI  with 3.0 x 38 mm DES. Mild to moderate, nonobstructive CAD involving the LMCA, LAD, LCx, and distal RCA. Peak of troponin 40. Echo showed Ejection fraction 55-60% with basal inferior akinesis. Bradycardia resolved post PCI. Had 2-1 AV block. However had brief paroxysmal atrial tachycardia during admission and started on metoprolol 12.5mg  BID. Stable rate. His blood pressure was elevated that improved on BB and ACE. He developed cough/bronchitis and improved on Z-pack.   He is here today for f/u. Still smoking Denies chest pain, palpitations, dizziness, orthopnea, PND, syncope, melena or blood in his stool or urine.   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   Past Medical History:  Diagnosis Date  . DM (diabetes mellitus) ([place])   . HTN (hypertension)   . MI (myocardial infarction) ([place]) 05/28/2016   DES to RCA    Past Surgical History:  Procedure Laterality Date  . CARDIAC CATHETERIZATION N/A 05/28/2016   Procedure: Left Heart Cath and Coronary Angiography;  Surgeon: Nelva Bush, MD;  Location: [place] [facility];  Service: Cardiovascular;  Laterality: N/A;  . CARDIAC CATHETERIZATION N/A 05/28/2016   Procedure: Coronary Stent Intervention;  Surgeon: Nelva Bush, MD;  Location: [place] [facility];  Service: Cardiovascular;  Laterality: N/A;    Current  Medications: Prior to Admission medications   Medication Sig Start Date End Date Taking? Authorizing Provider  aspirin EC 81 MG EC tablet Take 1 tablet (81 mg total) by mouth daily. 06/01/16   Bhavinkumar Bhagat, PA  atorvastatin (LIPITOR) 80 MG tablet Take 1 tablet (80 mg total) by mouth daily at 6 PM. 05/31/16   Bhavinkumar Bhagat, PA  Cholecalciferol (VITAMIN D3 PO) Take 1 tablet by mouth at bedtime.    Historical Provider, MD  glipiZIDE (GLUCOTROL XL) 10 MG 24 hr tablet Take 10 mg by mouth 2 (two) times daily with a meal.    Historical Provider, MD  lisinopril (PRINIVIL,ZESTRIL) 2.5 MG tablet Take 2.5 mg by mouth daily.    Historical Provider, MD  metFORMIN (GLUCOPHAGE-XR) 500 MG 24 hr tablet Take 500-1,000 mg by mouth See admin instructions. Take 1 tablet (500 mg) with breakfast and 2 tablets (1000 mg) with supper    Historical Provider, MD  metoprolol tartrate (LOPRESSOR) 25 MG tablet Take 0.5 tablets (12.5 mg total) by mouth 2 (two) times daily. 05/31/16   Bhavinkumar Bhagat, PA  nitroGLYCERIN (NITROSTAT) 0.4 MG SL tablet Place 1 tablet (0.4 mg total) under the tongue every 5 (five) minutes x 3 doses as needed for chest pain. 05/31/16   Erma Heritage, PA  Omega-3 Fatty Acids (FISH OIL) 1000 MG CAPS Take 1,000 mg by mouth 2 (two) times daily.    Historical Provider, MD  Phenylephrine-APAP-Guaifenesin (MUCINEX FAST-MAX PO) Take 20 mLs by mouth 2 (two) times  daily as needed (congestion).    Historical Provider, MD  ticagrelor (BRILINTA) 90 MG TABS tablet Take 1 tablet (90 mg total) by mouth 2 (two) times daily. 05/31/16   Leanor Kail, PA    Allergies:   Patient has no known allergies.   Social History   Social History  . Marital status: Widowed    Spouse name: N/A  . Number of children: N/A  . Years of education: N/A   Social History Main Topics  . Smoking status: Current Every Day Smoker    Packs/day: 1.50    Years: 30.00    Types: Cigarettes    Start date: 05/15/1968  .  Smokeless tobacco: Never Used     Comment: "I quite when I came in here"  . Alcohol use No  . Drug use: No  . Sexual activity: Not Asked   Other Topics Concern  . None   Social History Narrative  . None     Family History:  The patient's family history includes CAD in his brother and father; Diabetes in his brother and father.  Father - Multiple MI, first at age 63s Elder Brother - Multiple MI, first at age 85s Bother has uncontrolled DM   ROS:   Please see the history of present illness.    ROS All other systems reviewed and are negative.   PHYSICAL EXAM:   VS:  BP (!) 144/70   Pulse 85   Ht 5\' 11"  (1.803 m)   Wt 183 lb 9.6 oz (83.3 kg)   SpO2 96% Comment: on room air  BMI 25.61 kg/m    Affect appropriate Healthy:  appears stated age HEENT: normal Neck supple with no adenopathy JVP normal right carotid  bruits no thyromegaly Lungs clear with no wheezing and good diaphragmatic motion Heart:  S1/S2 no murmur, no rub, gallop or click PMI normal Abdomen: benighn, BS positve, no tenderness, no AAA no bruit.  No HSM or HJR Distal pulses intact with no bruits No edema Neuro non-focal Skin warm and dry No muscular weakness   Wt Readings from Last 3 Encounters:  03/01/17 183 lb 9.6 oz (83.3 kg)  09/01/16 187 lb (84.8 kg)  06/08/16 186 lb 12 oz (84.7 kg)      Studies/Labs Reviewed:   EKG:   05/28/16 SR RBBB IMI   Recent Labs: 05/28/2016: ALT 21; B Natriuretic Peptide 38.2; TSH 3.456 05/29/2016: BUN 10; Creatinine, Ser 0.66; Hemoglobin 13.4; Platelets 212; Potassium 4.1; Sodium 136   Lipid Panel    Component Value Date/Time   CHOL 57 06/08/2016 0827   TRIG 56 06/08/2016 0827   HDL 28 (L) 06/08/2016 0827   CHOLHDL 2.0 06/08/2016 0827   VLDL 11 06/08/2016 0827   LDLCALC 18 06/08/2016 0827    Additional studies/ records that were reviewed today include:   Cath 05/28/16 Conclusions: 1. Significant one vessel coronary artery disease with 60% proximal and  100% mid RCA stenoses. 2. Mild to moderate, nonobstructive CAD involving the LMCA, LAD, LCx, and distal RCA. 3. Normal left ventricular filling pressure. 4. Successful PCI to proximal and mid RCAwith placement of a Promus Premier 3.0 x 38 mm drug-eluting stent (post dilated to 3.5 mm) with 0% residual stenosis and TIMI-3 flow.  Recommendations: 1. Dual antiplatelet therapy with aspirin and ticagrelor for at least 12 months  ECHO 05/29/16: - Left ventricle: The cavity size was normal. There was mild focal basal hypertrophy of the septum. Systolic function was normal. The estimated ejection fraction was  in the range of 55% to 60%. There isakinesis of the basalinferior myocardium.There was an increased relative contribution of atrial contraction to ventricular filling. Doppler parameters are consistent with abnormal left ventricular relaxation (grade 1 diastolic dysfunction). - Aortic valve: Trileaflet; mildly thickened, mildly calcified leaflets. - Aorta: Aortic root dimension: 60mm (ED). - Aortic root: The aortic root was mildly dilated     ASSESSMENT & PLAN:    1. CAD/STEMI - S/p DES to RCA.05/28/16  Non-obstructive CAD left system  as described above. No anginal pain. Dyspnea improved off brillinta and on Effient  Continue ASA, Effient , BB, satin and ACE.  - Discussed HF symptoms in detailed. Echo with Ejection fraction 55-60% with basal inferior akinesis.  2. HTN  - Stable and well controlled.   3. HLD -  Continue lipitor.   4. Tobacco abuse - still with occasional cigs refer to [facility] COPD contributing to dyspnea has inhalers F/U [facility] Risk of recurrent MI discussed He may try hypnosis   5. GERD:  Improved with famotidine   6. DM - A1c of 6.4.   7. Bruit:  Right duplex done 09/03/16 plaque no stenosis f/u 2 years ASA      Jenkins Rouge

## 2017-03-01 ENCOUNTER — Ambulatory Visit (INDEPENDENT_AMBULATORY_CARE_PROVIDER_SITE_OTHER): Payer: Medicare HMO | Admitting: Cardiovascular Disease

## 2017-03-01 ENCOUNTER — Encounter: Payer: Self-pay | Admitting: Cardiovascular Disease

## 2017-03-01 VITALS — BP 144/70 | HR 85 | Ht 71.0 in | Wt 183.6 lb

## 2017-03-01 DIAGNOSIS — I2510 Atherosclerotic heart disease of native coronary artery without angina pectoris: Secondary | ICD-10-CM

## 2017-03-01 NOTE — Patient Instructions (Signed)

## 2017-05-05 DIAGNOSIS — R799 Abnormal finding of blood chemistry, unspecified: Secondary | ICD-10-CM | POA: Diagnosis not present

## 2017-05-05 DIAGNOSIS — E119 Type 2 diabetes mellitus without complications: Secondary | ICD-10-CM | POA: Diagnosis not present

## 2017-05-05 DIAGNOSIS — E785 Hyperlipidemia, unspecified: Secondary | ICD-10-CM | POA: Diagnosis not present

## 2017-05-12 ENCOUNTER — Encounter: Payer: Self-pay | Admitting: Gastroenterology

## 2017-05-12 DIAGNOSIS — D649 Anemia, unspecified: Secondary | ICD-10-CM | POA: Diagnosis not present

## 2017-05-12 DIAGNOSIS — J449 Chronic obstructive pulmonary disease, unspecified: Secondary | ICD-10-CM | POA: Diagnosis not present

## 2017-05-12 DIAGNOSIS — Z23 Encounter for immunization: Secondary | ICD-10-CM | POA: Diagnosis not present

## 2017-05-12 DIAGNOSIS — E1159 Type 2 diabetes mellitus with other circulatory complications: Secondary | ICD-10-CM | POA: Diagnosis not present

## 2017-05-12 DIAGNOSIS — I10 Essential (primary) hypertension: Secondary | ICD-10-CM | POA: Diagnosis not present

## 2017-05-12 DIAGNOSIS — Z0000 Encounter for general adult medical examination without abnormal findings: Secondary | ICD-10-CM | POA: Diagnosis not present

## 2017-05-12 DIAGNOSIS — E785 Hyperlipidemia, unspecified: Secondary | ICD-10-CM | POA: Diagnosis not present

## 2017-05-12 DIAGNOSIS — I2510 Atherosclerotic heart disease of native coronary artery without angina pectoris: Secondary | ICD-10-CM | POA: Diagnosis not present

## 2017-05-12 DIAGNOSIS — E1169 Type 2 diabetes mellitus with other specified complication: Secondary | ICD-10-CM | POA: Diagnosis not present

## 2017-06-02 ENCOUNTER — Other Ambulatory Visit: Payer: Self-pay | Admitting: Physician Assistant

## 2017-06-04 ENCOUNTER — Telehealth: Payer: Self-pay | Admitting: Cardiovascular Disease

## 2017-06-04 NOTE — Telephone Encounter (Signed)
Pt is in the doughnut hole w/ his Brilinta --please give pt's son Edd Arbour a call @ [phone]

## 2017-06-04 NOTE — Telephone Encounter (Signed)
4 bottles  (8 tabs in each) lot [id number], exp 10/2019 given to patient brilinta 90 mg

## 2017-06-21 ENCOUNTER — Telehealth: Payer: Self-pay | Admitting: Cardiovascular Disease

## 2017-06-21 NOTE — Telephone Encounter (Signed)
Pt is in the doughnut hole w/ his Brilinta --would like to have some samples

## 2017-06-21 NOTE — Telephone Encounter (Signed)
4 bottles samples, lot [id number], exp 11-2019 pt aware

## 2017-07-07 ENCOUNTER — Encounter (INDEPENDENT_AMBULATORY_CARE_PROVIDER_SITE_OTHER): Payer: Self-pay

## 2017-07-07 ENCOUNTER — Ambulatory Visit (INDEPENDENT_AMBULATORY_CARE_PROVIDER_SITE_OTHER)
Admission: RE | Admit: 2017-07-07 | Discharge: 2017-07-07 | Disposition: A | Discharge status: Home / Self Care | Payer: Medicare HMO | Source: Ambulatory Visit | Attending: Gastroenterology | Admitting: Gastroenterology

## 2017-07-07 ENCOUNTER — Other Ambulatory Visit (INDEPENDENT_AMBULATORY_CARE_PROVIDER_SITE_OTHER): Payer: Medicare HMO

## 2017-07-07 ENCOUNTER — Encounter: Payer: Self-pay | Admitting: Gastroenterology

## 2017-07-07 ENCOUNTER — Ambulatory Visit (INDEPENDENT_AMBULATORY_CARE_PROVIDER_SITE_OTHER): Payer: Medicare HMO | Admitting: Gastroenterology

## 2017-07-07 ENCOUNTER — Telehealth: Payer: Self-pay

## 2017-07-07 VITALS — BP 112/60 | HR 80 | Ht 71.0 in | Wt 189.4 lb

## 2017-07-07 DIAGNOSIS — D509 Iron deficiency anemia, unspecified: Secondary | ICD-10-CM | POA: Diagnosis not present

## 2017-07-07 DIAGNOSIS — I25810 Atherosclerosis of coronary artery bypass graft(s) without angina pectoris: Secondary | ICD-10-CM

## 2017-07-07 DIAGNOSIS — Z955 Presence of coronary angioplasty implant and graft: Secondary | ICD-10-CM | POA: Diagnosis not present

## 2017-07-07 DIAGNOSIS — J439 Emphysema, unspecified: Secondary | ICD-10-CM

## 2017-07-07 DIAGNOSIS — I10 Essential (primary) hypertension: Secondary | ICD-10-CM | POA: Diagnosis not present

## 2017-07-07 LAB — CBC WITH DIFFERENTIAL/PLATELET
Basophils Absolute: 0.2 10*3/uL — ABNORMAL HIGH (ref 0.0–0.1)
Basophils Relative: 1.5 % (ref 0.0–3.0)
EOS ABS: 1.3 10*3/uL — AB (ref 0.0–0.7)
Eosinophils Relative: 10.3 % — ABNORMAL HIGH (ref 0.0–5.0)
HEMATOCRIT: 39.2 % (ref 39.0–52.0)
HEMOGLOBIN: 12.7 g/dL — AB (ref 13.0–17.0)
LYMPHS PCT: 23.9 % (ref 12.0–46.0)
Lymphs Abs: 3 10*3/uL (ref 0.7–4.0)
MCHC: 32.5 g/dL (ref 30.0–36.0)
MCV: 93.7 fl (ref 78.0–100.0)
Monocytes Absolute: 0.9 10*3/uL (ref 0.1–1.0)
Monocytes Relative: 6.9 % (ref 3.0–12.0)
Neutro Abs: 7.3 10*3/uL (ref 1.4–7.7)
Neutrophils Relative %: 57.4 % (ref 43.0–77.0)
Platelets: 333 10*3/uL (ref 150.0–400.0)
RBC: 4.18 Mil/uL — ABNORMAL LOW (ref 4.22–5.81)
RDW: 18.8 % — ABNORMAL HIGH (ref 11.5–15.5)
WBC: 12.7 10*3/uL — ABNORMAL HIGH (ref 4.0–10.5)

## 2017-07-07 LAB — IBC PANEL
IRON: 59 ug/dL (ref 42–165)
Saturation Ratios: 15.7 % — ABNORMAL LOW (ref 20.0–50.0)
TRANSFERRIN: 269 mg/dL (ref 212.0–360.0)

## 2017-07-07 LAB — FOLATE: Folate: 5.6 ng/mL — ABNORMAL LOW (ref >5.9)

## 2017-07-07 LAB — FERRITIN: Ferritin: 27.6 ng/mL (ref 22.0–322.0)

## 2017-07-07 LAB — VITAMIN B12: Vitamin B-12: 412 pg/mL (ref 211–911)

## 2017-07-07 MED ORDER — PEG-KCL-NACL-NASULF-NA ASC-C 140 G PO SOLR: 140.0000 g | ORAL | 0 refills | Status: DC

## 2017-07-07 NOTE — Telephone Encounter (Signed)
Dear Charles Castro,    We have scheduled the above patient for an endoscopic procedure. Our records show that he is on anticoagulation therapy.   Please advise as to how long the patient may come off his therapy of Brilinta prior to the procedure, which is scheduled for 08-05-2017.  Please route back to [facility]

## 2017-07-07 NOTE — Progress Notes (Signed)
[facility] Consult Note:  History: Charles Castro 07/07/2017  Referring physician: Dione Housekeeper, MD  Reason for consult/chief complaint: Anemia (per Charles Castro; no blood noted in stool; per patient, Charles Castro said heme negative stool on exam as well)   Subjective  HPI:  This is a 71 year old man referred by Charles Castro for iron deficiency anemia.  Charles Castro denies chronic digestive symptoms such as abdominal pain, altered bowel habits, rectal bleeding, recurrent heartburn, dysphagia, vomiting or weight loss.  His last colonoscopy with Charles Castro in 2007 was incomplete to the ascending colon and removed hyperplastic polyp.  Office notes from primary care indicate that stool tests for occult blood were to be performed.  We do not have those results, but the patient was called and told they were negative.  He has been taking iron, blood counts have not been rechecked since mid October.  He is on dual antiplatelet therapy after inferior MI early November 2017.  I reviewed Charles Castro's August 2018 office note for details.   ROS:  Review of Systems  Constitutional: Negative for appetite change and unexpected weight change.  HENT: Negative for mouth sores and voice change.   Eyes: Negative for pain and redness.  Respiratory: Positive for shortness of breath. Negative for cough.        Chronic shortness of breath with exertion  Cardiovascular: Negative for chest pain and palpitations.  Genitourinary: Negative for dysuria and hematuria.  Musculoskeletal: Negative for arthralgias and myalgias.  Skin: Negative for pallor and rash.  Neurological: Negative for weakness and headaches.  Hematological: Negative for adenopathy.     Past Medical History: Past Medical History:  Diagnosis Date  . Anemia   . Colon polyp   . COPD (chronic obstructive pulmonary disease) ([place])   . Diverticulosis   . DM type 2 with diabetic dyslipidemia ([place])   . HTN (hypertension)   . MI  (myocardial infarction) ([place]) 05/28/2016   DES to RCA  . Nicotine dependence   . STEMI (ST elevation myocardial infarction) ([place])   . Type 2 diabetes mellitus ([place])      Past Surgical History: Past Surgical History:  Procedure Laterality Date  . CARDIAC CATHETERIZATION N/A 05/28/2016   Procedure: Left Heart Cath and Coronary Angiography;  Surgeon: Charles Bush, MD;  Location: [facility];  Service: Cardiovascular;  Laterality: N/A;  . CARDIAC CATHETERIZATION N/A 05/28/2016   Procedure: Coronary Stent Intervention;  Surgeon: Charles Bush, MD;  Location: [facility];  Service: Cardiovascular;  Laterality: N/A;     Family History: Family History  Problem Relation Age of Onset  . CAD Father         with  multiple MI, first at age 24  . Diabetes Father   . CAD Brother        First Mi at age 59,  . Diabetes Brother   . Colon cancer Mother        in her 52s   . Diabetes Sister     Social History: Social History   Socioeconomic History  . Marital status: Widowed    Spouse name: None  . Number of children: None  . Years of education: None  . Highest education level: None  Social Needs  . Financial resource strain: None  . Food insecurity - worry: None  . Food insecurity - inability: None  . Transportation needs - medical: None  . Transportation needs - non-medical: None  Occupational History  . None  Tobacco Use  .  Smoking status: Current Every Day Smoker    Packs/day: 1.50    Years: 30.00    Pack years: 45.00    Types: Cigarettes    Start date: 05/15/1968  . Smokeless tobacco: Never Used  Substance and Sexual Activity  . Alcohol use: Yes    Comment: 5 drinks per year  . Drug use: No  . Sexual activity: None  Other Topics Concern  . None  Social History Narrative  . None    Allergies: No Known Allergies  Outpatient Meds: Current Outpatient Medications  Medication Sig Dispense Refill  . aspirin EC 81 MG EC tablet Take 1 tablet (81 mg  total) by mouth daily. 30 tablet 11  . atorvastatin (LIPITOR) 80 MG tablet Take 1 tablet (80 mg total) by mouth daily at 6 PM. (Patient taking differently: Take 40 mg by mouth daily at 6 PM. ) 30 tablet 11  . BRILINTA 90 MG TABS tablet TAKE ONE TABLET BY MOUTH TWICE DAILY 180 tablet 0  . ferrous sulfate 325 (65 FE) MG tablet Take 325 mg by mouth 2 (two) times daily with a meal.    . glipiZIDE (GLUCOTROL XL) 10 MG 24 hr tablet Take 5 mg by mouth 2 (two) times daily with a meal.     . lisinopril (PRINIVIL,ZESTRIL) 2.5 MG tablet Take 2.5 mg by mouth daily.    . metFORMIN (GLUCOPHAGE-XR) 500 MG 24 hr tablet Take 500-1,000 mg by mouth See admin instructions. Take 1 tablet (500 mg) with breakfast and 2 tablets (1000 mg) with supper    . Omega-3 Fatty Acids (FISH OIL) 1000 MG CAPS Take 1,000 mg by mouth 2 (two) times daily.    . nitroGLYCERIN (NITROSTAT) 0.4 MG SL tablet Place 1 tablet (0.4 mg total) under the tongue every 5 (five) minutes x 3 doses as needed for chest pain. (Patient not taking: Reported on 07/07/2017) 25 tablet 2  . PEG-KCl-NaCl-NaSulf-Na Asc-C (PLENVU) 140 g SOLR Take 140 g by mouth as directed. 1 each 0   No current facility-administered medications for this visit.       ___________________________________________________________________ Objective   Exam:  BP 112/60   Pulse 80   Ht 5\' 11"  (1.803 m)   Wt 189 lb 6.4 oz (85.9 kg)   BMI 26.42 kg/m    General: this is a(n) well-appearing man with no respiratory difficulty.  Eyes: sclera anicteric, no redness  ENT: oral mucosa moist without lesions, no cervical or supraclavicular lymphadenopathy, upper and lower dentures  CV: RRR without murmur, S1/S2, no JVD, no peripheral edema  Resp: Decreased breath sounds bilaterally, more so on the right, normal RR and effort noted  GI: soft, no tenderness, with active bowel sounds. No guarding or palpable organomegaly noted.  Skin; warm and dry, no rash or jaundice  noted  Neuro: awake, alert and oriented x 3. Normal gross motor function and fluent speech  Labs:  Colonoscopy 2007 with Charles Castro - incomplete to Asc colon due to "patient intolerance".  Hyperplastic polyp removed  Radiologic Studies:  I sent him for a chest x-ray today based on his physical exam findings.  He has changes of emphysema, no mass and no effusion.  Assessment: Encounter Diagnoses  Name Primary?  . Iron deficiency anemia, unspecified iron deficiency anemia type Yes  . Coronary artery disease involving coronary bypass graft of native heart without angina pectoris   . S/P right coronary artery (RCA) stent placement   . Pulmonary emphysema, unspecified emphysema type ([place])  Iron deficiency anemia with reported heme-negative stool.  Plan:  EGD and colonoscopy.  He is agreeable after thorough discussion of procedure and risks in the presence of his son.  The benefits and risks of the planned procedure were described in detail with the patient or (when appropriate) their health care proxy.  Risks were outlined as including, but not limited to, bleeding, infection, perforation, adverse medication reaction leading to cardiac or pulmonary decompensation, or pancreatitis (if ERCP).  The limitation of incomplete mucosal visualization was also discussed.  No guarantees or warranties were given.  We will contact  his cardiologist because I would like him to be off Brilinta 5 days prior to the procedure.  CBC, iron studies and B12/folate today  Thank you for the courtesy of this consult.  Please call me with any questions or concerns.  Nelida Meuse III  CC: Charles Housekeeper, MD

## 2017-07-07 NOTE — Telephone Encounter (Signed)
Ok to hold brillinta for 5 days

## 2017-07-07 NOTE — Telephone Encounter (Signed)
   Primary Cardiologist: Dr. Johnsie Cancel  Received clearance for holding patient's Brilinta due to needing to undergo an EGD and colonoscopy. Had a STEMI in 05/2016 with DES to mid-RCA at that time. No interventions since and was doing well at the time of his office visit with Dr. Johnsie Cancel in 02/2017.  Will route to Dr. Johnsie Cancel on instructions for holding Brilinta. With him being greater than 12 months out from stent placement, could likely hold for 5 days prior to his procedure but will ask for Dr. Kyla Balzarine input as well.   Erma Heritage, PA-C 07/07/2017, 3:46 PM

## 2017-07-08 ENCOUNTER — Telehealth: Payer: Self-pay | Admitting: Cardiovascular Disease

## 2017-07-08 ENCOUNTER — Other Ambulatory Visit: Payer: Self-pay

## 2017-07-08 DIAGNOSIS — D508 Other iron deficiency anemias: Secondary | ICD-10-CM

## 2017-07-08 NOTE — Telephone Encounter (Signed)
   Primary Cardiologist: Johnsie Cancel  Chart reviewed as part of pre-operative protocol coverage. Patient was contacted 07/08/2017 in reference to pre-operative risk assessment for pending surgery as outlined below.  Charles Castro was last seen on 03/01/2017 by Dr. Johnsie Cancel. Since that day, Charles Castro has done well and has been without any chest pain, SOB, palpitations, or DOE. Dr. Johnsie Cancel has provided clearance for the patient to stop Brilinta 5 days prior to procedure. He will continue aspirin 81 mg daily throughout the procedure. Resumption of Brilinta will be deemed by the treating physician.   Therefore, based on ACC/AHA guidelines, the patient would be at acceptable risk for the planned procedure without further cardiovascular testing.   I will route this recommendation to the requesting party via Epic fax function and remove from pre-op pool.  Please call with questions.  Christell Faith, PA-C 07/08/2017, 4:12 PM

## 2017-07-08 NOTE — Telephone Encounter (Signed)
Per phone call from pt's son Edd Arbour, would like to know if the pt could get any samples of Brilinta --please give him a call @ [phone]

## 2017-07-08 NOTE — Telephone Encounter (Signed)
Spoke to [employer] (son). Verbal understanding to hold Brilinta 5 days prior to procedure.

## 2017-07-08 NOTE — Telephone Encounter (Signed)
Returned pt's sons call. Made him aware that there were samples in front office.

## 2017-07-16 DIAGNOSIS — J019 Acute sinusitis, unspecified: Secondary | ICD-10-CM | POA: Diagnosis not present

## 2017-07-16 DIAGNOSIS — I2510 Atherosclerotic heart disease of native coronary artery without angina pectoris: Secondary | ICD-10-CM | POA: Diagnosis not present

## 2017-07-16 DIAGNOSIS — I10 Essential (primary) hypertension: Secondary | ICD-10-CM | POA: Diagnosis not present

## 2017-07-22 ENCOUNTER — Telehealth: Payer: Self-pay | Admitting: Cardiovascular Disease

## 2017-07-22 NOTE — Telephone Encounter (Signed)
Returned pt call, no answer. Will place samples of Brilinta in cabinet and return pt call later.  Lot : [id number] Exp: [id number]

## 2017-07-22 NOTE — Telephone Encounter (Signed)
Requesting samples of Brillinta due to being in "doughnut hole" / tg

## 2017-07-28 ENCOUNTER — Encounter: Payer: Self-pay | Admitting: Gastroenterology

## 2017-08-05 ENCOUNTER — Encounter: Payer: Self-pay | Admitting: Gastroenterology

## 2017-08-05 ENCOUNTER — Ambulatory Visit (AMBULATORY_SURGERY_CENTER): Payer: Medicare HMO | Admitting: Gastroenterology

## 2017-08-05 VITALS — BP 100/58 | HR 61 | Temp 98.6°F | Resp 12 | Ht 71.0 in | Wt 189.0 lb

## 2017-08-05 DIAGNOSIS — D120 Benign neoplasm of cecum: Secondary | ICD-10-CM

## 2017-08-05 DIAGNOSIS — D649 Anemia, unspecified: Secondary | ICD-10-CM | POA: Diagnosis not present

## 2017-08-05 DIAGNOSIS — D124 Benign neoplasm of descending colon: Secondary | ICD-10-CM | POA: Diagnosis not present

## 2017-08-05 DIAGNOSIS — D123 Benign neoplasm of transverse colon: Secondary | ICD-10-CM | POA: Diagnosis not present

## 2017-08-05 DIAGNOSIS — D508 Other iron deficiency anemias: Secondary | ICD-10-CM | POA: Diagnosis not present

## 2017-08-05 DIAGNOSIS — D125 Benign neoplasm of sigmoid colon: Secondary | ICD-10-CM

## 2017-08-05 DIAGNOSIS — D122 Benign neoplasm of ascending colon: Secondary | ICD-10-CM

## 2017-08-05 DIAGNOSIS — K635 Polyp of colon: Secondary | ICD-10-CM

## 2017-08-05 MED ORDER — SODIUM CHLORIDE 0.9 % IV SOLN
500.0000 mL | Freq: Once | INTRAVENOUS | Status: DC
Start: 2017-08-05 — End: 2017-08-05

## 2017-08-05 NOTE — Progress Notes (Signed)
To PACU, VSS. Report to [email or web address] 

## 2017-08-05 NOTE — Op Note (Signed)
[facility] Patient Name: Charles Castro Procedure Date: 08/05/2017 2:09 PM MRN: [id number] Endoscopist: Mallie Mussel L. Charles Castro , MD Age: 72 Referring MD:  Date of Birth: 1946-06-27 Gender: Male Account #: [id number] Procedure:                Upper GI endoscopy Indications:              Unexplained iron deficiency anemia (heme negative).                            Hemoglobin normalized with oral iron. Medicines:                Monitored Anesthesia Care Procedure:                Pre-Anesthesia Assessment:                           - Prior to the procedure, a History and Physical                            was performed, and patient medications and                            allergies were reviewed. The patient's tolerance of                            previous anesthesia was also reviewed. The risks                            and benefits of the procedure and the sedation                            options and risks were discussed with the patient.                            All questions were answered, and informed consent                            was obtained. Prior Anticoagulants: The patient                            last took aspirin 1 day prior to the procedure and                            Brilinta 5 days prior. ASA Grade Assessment: III -                            A patient with severe systemic disease. After                            reviewing the risks and benefits, the patient was                            deemed in satisfactory condition to undergo the  procedure.                           After obtaining informed consent, the endoscope was                            passed under direct vision. Throughout the                            procedure, the patient's blood pressure, pulse, and                            oxygen saturations were monitored continuously. The                            Model GIF-HQ190 [id number]) scope was introduced                             through the mouth, and advanced to the second part                            of duodenum. The upper GI endoscopy was                            accomplished without difficulty. The patient                            tolerated the procedure well. Scope In: Scope Out: Findings:                 The esophagus was normal.                           The stomach was normal.                           The cardia and gastric fundus were normal on                            retroflexion.                           A single erosion with stigmata of recent bleeding                            was found in the duodenal bulb. Complications:            No immediate complications. Estimated Blood Loss:     Estimated blood loss: none. Impression:               - Normal esophagus.                           - Normal stomach.                           - Duodenal erosion with stigmata of recent  bleeding. Not felt to be the source of iron                            deficiency anemia.                           - No specimens collected. Recommendation:           - Patient has a contact number available for                            emergencies. The signs and symptoms of potential                            delayed complications were discussed with the                            patient. Return to normal activities tomorrow.                            Written discharge instructions were provided to the                            patient.                           - Resume previous diet.                           - Hold aspirin 3 days and resume Brilinta in 5 days                            at prior dose.                           - See the other procedure note for documentation of                            additional recommendations. Zuha Dejonge L. Charles Carrow, MD 08/05/2017 3:00:37 PM This report has been signed electronically.

## 2017-08-05 NOTE — Op Note (Signed)
[facility] Patient Name: Charles Castro Procedure Date: 08/05/2017 2:08 PM MRN: [id number] Endoscopist: Mallie Mussel Charles Castro , MD Age: 72 Referring MD:  Date of Birth: [birth year] Gender: Male Account #: [id number] Procedure:                Colonoscopy Indications:              Unexplained iron deficiency anemia (heme negative,                            hemoglobin normalized with oral iron). Last                            colonoscopy in 2007 incomplete to ascending colon Medicines:                Monitored Anesthesia Care Procedure:                Pre-Anesthesia Assessment:                           - Prior to the procedure, a History and Physical                            was performed, and patient medications and                            allergies were reviewed. The patient's tolerance of                            previous anesthesia was also reviewed. The risks                            and benefits of the procedure and the sedation                            options and risks were discussed with the patient.                            All questions were answered, and informed consent                            was obtained. Anticoagulants: The patient has taken                            aspirin. Last dose of Brilinta was 5 days prior to                            procedure. It was decided not to withhold this                            medication prior to the procedure. ASA Grade                            Assessment: III - A patient with severe systemic  disease. After reviewing the risks and benefits,                            the patient was deemed in satisfactory condition to                            undergo the procedure.                           After obtaining informed consent, the colonoscope                            was passed under direct vision. Throughout the                            procedure, the patient's blood pressure,  pulse, and                            oxygen saturations were monitored continuously. The                            Colonoscope was introduced through the anus and                            advanced to the the cecum, identified by                            appendiceal orifice and ileocecal valve. The                            colonoscopy was somewhat difficult due to a                            tortuous colon. Successful completion of the                            procedure was aided by using manual pressure. The                            patient tolerated the procedure well. The quality                            of the bowel preparation was excellent. The                            ileocecal valve, appendiceal orifice, and rectum                            were photographed. The quality of the bowel                            preparation was evaluated using the BBPS [facility]  Bowel Preparation Scale) with scores of: Right                            Colon = 3, Transverse Colon = 3 and Left Colon = 3                            (entire mucosa seen well with no residual staining,                            small fragments of stool or opaque liquid). The                            total BBPS score equals 9. The bowel preparation                            used was Plenvu. Scope In: 2:20:16 PM Scope Out: 2:51:46 PM Scope Withdrawal Time: 0 hours 24 minutes 58 seconds  Total Procedure Duration: 0 hours 31 minutes 30 seconds  Findings:                 The perianal and digital rectal examinations were                            normal.                           A 2 mm polyp was found in the cecum. The polyp was                            sessile. The polyp was removed with a cold biopsy                            forceps. Resection and retrieval were complete.                            (Jar 1)                           A 10-12 mm polyp was found in the cecum.  The polyp                            was multi-lobulated and sessile. The polyp was                            removed piecemeal with a cold snare. Resection and                            retrieval were complete. (Jar 1)                           Two sessile polyps were found in the ascending                            colon. The polyps  were 6 to 10 mm in size. These                            polyps were removed with a cold snare. Resection                            and retrieval were complete. (Jar 2)                           A 12 mm polyp was found in the hepatic flexure. The                            polyp was multi-lobulated and sessile. The polyp                            was removed piecemeal with both a hot and cold                            snare. Resection and retrieval were complete. (Jar                            3)                           A 4 mm polyp was found in the proximal transverse                            colon. The polyp was sessile. The polyp was removed                            with a cold snare. Resection and retrieval were                            complete. (Jar 3)                           Three sessile polyps were found in the sigmoid                            colon, descending colon and distal transverse                            colon. The polyps were 4 to 6 mm in size. These                            polyps were removed with a cold snare. Resection                            and retrieval were complete. (Jar 4)                           Multiple small-mouthed diverticula were found in  the left colon.                           The exam was otherwise without abnormality on                            direct and retroflexion views. Complications:            No immediate complications. Estimated Blood Loss:     Estimated blood loss was minimal. Impression:               - One 2 mm polyp in the cecum, removed with a cold                             biopsy forceps. Resected and retrieved.                           - One 10 mm polyp in the cecum, removed with a cold                            snare. Resected and retrieved.                           - Two 6 to 10 mm polyps in the ascending colon,                            removed with a cold snare. Resected and retrieved.                           - One 12 mm polyp at the hepatic flexure, removed                            with a hot snare. Resected and retrieved.                           - One 4 mm polyp in the proximal transverse colon,                            removed with a cold snare. Resected and retrieved.                           - Three 4 to 6 mm polyps in the sigmoid colon, in                            the descending colon and in the distal transverse                            colon, removed with a cold snare. Resected and                            retrieved.                           - Diverticulosis  in the left colon.                           - The examination was otherwise normal on direct                            and retroflexion views.                           No source for iron deficiency anemia found. Recommendation:           - Patient has a contact number available for                            emergencies. The signs and symptoms of potential                            delayed complications were discussed with the                            patient. Return to normal activities tomorrow.                            Written discharge instructions were provided to the                            patient.                           - Resume previous diet.                           - Hold aspirin 3 days and resume Brilinta in 5 days                            at prior doses.                           - Await pathology results.                           - Repeat colonoscopy is recommended for                            surveillance.  The colonoscopy date will be                            determined after pathology results from today's                            exam become available for review.                           - Return to referring physician as previously  scheduled to monitor blood count. Karsen Nakanishi [name], MD 08/05/2017 3:12:36 PM This report has been signed electronically.

## 2017-08-05 NOTE — Progress Notes (Signed)
Pt's states no medical or surgical changes since previsit or office visit. 

## 2017-08-05 NOTE — Progress Notes (Signed)
Called to room to assist during endoscopic procedure.  Patient ID and intended procedure confirmed with present staff. Received instructions for my participation in the procedure from the performing physician.  

## 2017-08-05 NOTE — Patient Instructions (Signed)
**  handouts given on polyps and diverticulosis**  **Hold Aspirin for 3 days & Brilinta for 5 days**   YOU HAD AN ENDOSCOPIC PROCEDURE TODAY: Refer to the procedure report and other information in the discharge instructions given to you for any specific questions about what was found during the examination. If this information does not answer your questions, please call [place] office at [phone] to clarify.   YOU SHOULD EXPECT: Some feelings of bloating in the abdomen. Passage of more gas than usual. Walking can help get rid of the air that was put into your GI tract during the procedure and reduce the bloating. If you had a lower endoscopy (such as a colonoscopy or flexible sigmoidoscopy) you may notice spotting of blood in your stool or on the toilet paper. Some abdominal soreness may be present for a day or two, also.  DIET: Your first meal following the procedure should be a light meal and then it is ok to progress to your normal diet. A half-sandwich or bowl of soup is an example of a good first meal. Heavy or fried foods are harder to digest and may make you feel nauseous or bloated. Drink plenty of fluids but you should avoid alcoholic beverages for 24 hours. If you had a esophageal dilation, please see attached instructions for diet.    ACTIVITY: Your care partner should take you home directly after the procedure. You should plan to take it easy, moving slowly for the rest of the day. You can resume normal activity the day after the procedure however YOU SHOULD NOT DRIVE, use power tools, machinery or perform tasks that involve climbing or major physical exertion for 24 hours (because of the sedation medicines used during the test).   SYMPTOMS TO REPORT IMMEDIATELY: A gastroenterologist can be reached at any hour. Please call [phone]  for any of the following symptoms:  Following lower endoscopy (colonoscopy, flexible sigmoidoscopy) Excessive amounts of blood in the stool   Significant tenderness, worsening of abdominal pains  Swelling of the abdomen that is new, acute  Fever of 100 or higher  Following upper endoscopy (EGD, EUS, ERCP, esophageal dilation) Vomiting of blood or coffee ground material  New, significant abdominal pain  New, significant chest pain or pain under the shoulder blades  Painful or persistently difficult swallowing  New shortness of breath  Black, tarry-looking or red, bloody stools  FOLLOW UP:  If any biopsies were taken you will be contacted by phone or by letter within the next 1-3 weeks. Call [phone]  if you have not heard about the biopsies in 3 weeks.  Please also call with any specific questions about appointments or follow up tests.

## 2017-08-06 ENCOUNTER — Telehealth: Payer: Self-pay

## 2017-08-06 ENCOUNTER — Other Ambulatory Visit: Payer: Self-pay

## 2017-08-06 NOTE — Telephone Encounter (Signed)
  Follow up Call-  Call back number 08/05/2017  Post procedure Call Back phone  # [phone]  Permission to leave phone message No  Some recent data might be hidden     Patient questions:  Do you have a fever, pain , or abdominal swelling? No. Pain Score  0 *  Have you tolerated food without any problems? Yes.    Have you been able to return to your normal activities? Yes.    Do you have any questions about your discharge instructions: Diet   No. Medications  No. Follow up visit  No.  Do you have questions or concerns about your Care? No.  Actions: * If pain score is 4 or above: No action needed, pain <4.

## 2017-08-11 DIAGNOSIS — E119 Type 2 diabetes mellitus without complications: Secondary | ICD-10-CM | POA: Diagnosis not present

## 2017-08-11 DIAGNOSIS — I10 Essential (primary) hypertension: Secondary | ICD-10-CM | POA: Diagnosis not present

## 2017-08-12 ENCOUNTER — Encounter: Payer: Self-pay | Admitting: Gastroenterology

## 2017-08-16 DIAGNOSIS — J209 Acute bronchitis, unspecified: Secondary | ICD-10-CM | POA: Diagnosis not present

## 2017-08-16 DIAGNOSIS — J449 Chronic obstructive pulmonary disease, unspecified: Secondary | ICD-10-CM | POA: Diagnosis not present

## 2017-08-16 DIAGNOSIS — E1159 Type 2 diabetes mellitus with other circulatory complications: Secondary | ICD-10-CM | POA: Diagnosis not present

## 2017-08-16 DIAGNOSIS — I10 Essential (primary) hypertension: Secondary | ICD-10-CM | POA: Diagnosis not present

## 2017-08-16 DIAGNOSIS — I2129 ST elevation (STEMI) myocardial infarction involving other sites: Secondary | ICD-10-CM | POA: Diagnosis not present

## 2017-08-16 DIAGNOSIS — E1169 Type 2 diabetes mellitus with other specified complication: Secondary | ICD-10-CM | POA: Diagnosis not present

## 2017-08-16 DIAGNOSIS — E785 Hyperlipidemia, unspecified: Secondary | ICD-10-CM | POA: Diagnosis not present

## 2017-10-10 NOTE — Progress Notes (Signed)
Cardiology Office Note    Date:  10/13/2017   ID:  Charles Castro, DOB 11/19/1945, MRN [id number]  PCP:  Dione Housekeeper, MD  Cardiologist: Dr. Saunders Revel (He likes to f/u at [facility])   Chief Complaint: Hospital follow up s/p STEMI  History of Present Illness:   72 y.o. history of HTN, DM, smoking CAD. STEMI 05/28/16 with bradycardia DES to mid RCA no significant left sided disease. Peak troponin very high 40 but EF normal my echo 55-60%    Still smoking 3-4 / day Never did hypnosis  Denies chest pain, palpitations, dizziness, orthopnea, PND, syncope, melena or blood in his stool or urine.   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  [place] see [place] Discussed not using Viagra given CAD Has new girlfriend for 6 months  Past Medical History:  Diagnosis Date  . Anemia   . Colon polyp   . COPD (chronic obstructive pulmonary disease) ([place])   . Diverticulosis   . DM type 2 with diabetic dyslipidemia ([place])   . HTN (hypertension)   . MI (myocardial infarction) ([place]) 05/28/2016   DES to RCA  . Nicotine dependence   . STEMI (ST elevation myocardial infarction) ([place])   . Type 2 diabetes mellitus ([place])     Past Surgical History:  Procedure Laterality Date  . CARDIAC CATHETERIZATION N/A 05/28/2016   Procedure: Left Heart Cath and Coronary Angiography;  Surgeon: Nelva Bush, MD;  Location: [place] [facility];  Service: Cardiovascular;  Laterality: N/A;  . CARDIAC CATHETERIZATION N/A 05/28/2016   Procedure: Coronary Stent Intervention;  Surgeon: Nelva Bush, MD;  Location: [place] [facility];  Service: Cardiovascular;  Laterality: N/A;    Current Medications: Prior to Admission medications   Medication Sig Start Date End Date Taking? Authorizing Provider  aspirin EC 81 MG EC tablet Take 1 tablet (81 mg total) by mouth daily. 06/01/16   Bhavinkumar Bhagat, PA    atorvastatin (LIPITOR) 80 MG tablet Take 1 tablet (80 mg total) by mouth daily at 6 PM. 05/31/16   Bhavinkumar Bhagat, PA  Cholecalciferol (VITAMIN D3 PO) Take 1 tablet by mouth at bedtime.    Historical Provider, MD  glipiZIDE (GLUCOTROL XL) 10 MG 24 hr tablet Take 10 mg by mouth 2 (two) times daily with a meal.    Historical Provider, MD  lisinopril (PRINIVIL,ZESTRIL) 2.5 MG tablet Take 2.5 mg by mouth daily.    Historical Provider, MD  metFORMIN (GLUCOPHAGE-XR) 500 MG 24 hr tablet Take 500-1,000 mg by mouth See admin instructions. Take 1 tablet (500 mg) with breakfast and 2 tablets (1000 mg) with supper    Historical Provider, MD  metoprolol tartrate (LOPRESSOR) 25 MG tablet Take 0.5 tablets (12.5 mg total) by mouth 2 (two) times daily. 05/31/16   Bhavinkumar Bhagat, PA  nitroGLYCERIN (NITROSTAT) 0.4 MG SL tablet Place 1 tablet (0.4 mg total) under the tongue every 5 (five) minutes x 3 doses as needed for chest pain. 05/31/16   Erma Heritage, PA  Omega-3 Fatty Acids (FISH OIL) 1000 MG CAPS Take 1,000 mg by mouth 2 (two) times daily.    Historical Provider, MD  Phenylephrine-APAP-Guaifenesin (MUCINEX FAST-MAX PO) Take 20 mLs by mouth 2 (two) times daily as needed (congestion).    Historical Provider, MD  ticagrelor (BRILINTA) 90 MG TABS tablet Take 1 tablet (90 mg total) by mouth  2 (two) times daily. 05/31/16   Leanor Kail, PA    Allergies:   Patient has no known allergies.   Social History   Socioeconomic History  . Marital status: Widowed    Spouse name: None  . Number of children: None  . Years of education: None  . Highest education level: None  Social Needs  . Financial resource strain: None  . Food insecurity - worry: None  . Food insecurity - inability: None  . Transportation needs - medical: None  . Transportation needs - non-medical: None  Occupational History  . None  Tobacco Use  . Smoking status: Current Every Day Smoker    Packs/day: 0.25    Years: 30.00     Pack years: 7.50    Types: Cigarettes    Start date: 05/15/1968  . Smokeless tobacco: Never Used  Substance and Sexual Activity  . Alcohol use: Yes    Comment: 5 drinks per year  . Drug use: No  . Sexual activity: None  Other Topics Concern  . None  Social History Narrative  . None     Family History:  The patient's family history includes CAD in his brother and father; Colon cancer in his mother; Diabetes in his brother, father, and sister.  Father - Multiple MI, first at age 31s Elder Brother - Multiple MI, first at age 60s Bother has uncontrolled DM   ROS:   Please see the history of present illness.    ROS All other systems reviewed and are negative.   PHYSICAL EXAM:   VS:  BP 130/75   Pulse 74   Ht 5\' 11"  (1.803 m)   Wt 179 lb (81.2 kg)   BMI 24.97 kg/m    Affect appropriate Healthy:  appears stated age 72: normal Neck supple with no adenopathy JVP normal right  bruits no thyromegaly Lungs clear with no wheezing and good diaphragmatic motion Heart:  S1/S2 no murmur, no rub, gallop or click PMI normal Abdomen: benighn, BS positve, no tenderness, no AAA no bruit.  No HSM or HJR Distal pulses intact with no bruits No edema Neuro non-focal Skin warm and dry No muscular weakness    Wt Readings from Last 3 Encounters:  10/13/17 179 lb (81.2 kg)  08/05/17 189 lb (85.7 kg)  07/07/17 189 lb 6.4 oz (85.9 kg)      Studies/Labs Reviewed:   EKG:   05/28/16 SR RBBB IMI  10/13/17  SR RBBB rate 64 no acute changes   Recent Labs: 07/07/2017: Hemoglobin 12.7; Platelets 333.0   Lipid Panel    Component Value Date/Time   CHOL 57 06/08/2016 0827   TRIG 56 06/08/2016 0827   HDL 28 (L) 06/08/2016 0827   CHOLHDL 2.0 06/08/2016 0827   VLDL 11 06/08/2016 0827   LDLCALC 18 06/08/2016 0827    Additional studies/ records that were reviewed today include:   Cath 05/28/16 Conclusions: 1. Significant one vessel coronary artery disease with 60% proximal and 100%  mid RCA stenoses. 2. Mild to moderate, nonobstructive CAD involving the LMCA, LAD, LCx, and distal RCA. 3. Normal left ventricular filling pressure. 4. Successful PCI to proximal and mid RCAwith placement of a Promus Premier 3.0 x 38 mm drug-eluting stent (post dilated to 3.5 mm) with 0% residual stenosis and TIMI-3 flow.  Recommendations: 1. Dual antiplatelet therapy with aspirin and ticagrelor for at least 12 months  ECHO 05/29/16: - Left ventricle: The cavity size was normal. There was mild focal basal hypertrophy of  the septum. Systolic function was normal. The estimated ejection fraction was in the range of 55% to 60%. There isakinesis of the basalinferior myocardium.There was an increased relative contribution of atrial contraction to ventricular filling. Doppler parameters are consistent with abnormal left ventricular relaxation (grade 1 diastolic dysfunction). - Aortic valve: Trileaflet; mildly thickened, mildly calcified leaflets. - Aorta: Aortic root dimension: 52mm (ED). - Aortic root: The aortic root was mildly dilated     ASSESSMENT & PLAN:    1. CAD/STEMI DES to RCA 05/28/16 non obstructive left system No anginal pain. Ok to stop DAT in future For procedures New nitro called in   2. HTN : Well controlled.  Continue current medications and low sodium Dash type diet.     3. HLD: continue lipitor labs with primary    4. Smoking: f/u [place] COPD no active wheezing CXR 07/07/17 NAD  5. GERD:  Improved with famotidine   6. DM: Discussed low carb diet.  Target hemoglobin A1c is 6.5 or less.  Continue current medications.    7. Bruit:  Right duplex done 09/03/16 plaque no stenosis f/u 2 years ASA      Jenkins Rouge

## 2017-10-13 ENCOUNTER — Ambulatory Visit (INDEPENDENT_AMBULATORY_CARE_PROVIDER_SITE_OTHER): Payer: Medicare HMO | Admitting: Cardiovascular Disease

## 2017-10-13 ENCOUNTER — Encounter: Payer: Self-pay | Admitting: Cardiovascular Disease

## 2017-10-13 VITALS — BP 130/75 | HR 74 | Ht 71.0 in | Wt 179.0 lb

## 2017-10-13 DIAGNOSIS — I2510 Atherosclerotic heart disease of native coronary artery without angina pectoris: Secondary | ICD-10-CM | POA: Diagnosis not present

## 2017-10-13 MED ORDER — NITROGLYCERIN 0.4 MG SL SUBL: 0.4000 mg | SUBLINGUAL_TABLET | SUBLINGUAL | 3 refills | Status: AC | PRN

## 2017-10-13 NOTE — Patient Instructions (Signed)

## 2017-10-18 DIAGNOSIS — M5137 Other intervertebral disc degeneration, lumbosacral region: Secondary | ICD-10-CM | POA: Diagnosis not present

## 2017-10-18 DIAGNOSIS — M9905 Segmental and somatic dysfunction of pelvic region: Secondary | ICD-10-CM | POA: Diagnosis not present

## 2017-10-18 DIAGNOSIS — M9903 Segmental and somatic dysfunction of lumbar region: Secondary | ICD-10-CM | POA: Diagnosis not present

## 2017-10-18 DIAGNOSIS — M9904 Segmental and somatic dysfunction of sacral region: Secondary | ICD-10-CM | POA: Diagnosis not present

## 2017-10-19 DIAGNOSIS — M5137 Other intervertebral disc degeneration, lumbosacral region: Secondary | ICD-10-CM | POA: Diagnosis not present

## 2017-10-19 DIAGNOSIS — M9904 Segmental and somatic dysfunction of sacral region: Secondary | ICD-10-CM | POA: Diagnosis not present

## 2017-10-19 DIAGNOSIS — M9903 Segmental and somatic dysfunction of lumbar region: Secondary | ICD-10-CM | POA: Diagnosis not present

## 2017-10-19 DIAGNOSIS — M9905 Segmental and somatic dysfunction of pelvic region: Secondary | ICD-10-CM | POA: Diagnosis not present

## 2017-10-20 DIAGNOSIS — M9904 Segmental and somatic dysfunction of sacral region: Secondary | ICD-10-CM | POA: Diagnosis not present

## 2017-10-20 DIAGNOSIS — M5137 Other intervertebral disc degeneration, lumbosacral region: Secondary | ICD-10-CM | POA: Diagnosis not present

## 2017-10-20 DIAGNOSIS — M9905 Segmental and somatic dysfunction of pelvic region: Secondary | ICD-10-CM | POA: Diagnosis not present

## 2017-10-20 DIAGNOSIS — M9903 Segmental and somatic dysfunction of lumbar region: Secondary | ICD-10-CM | POA: Diagnosis not present

## 2017-10-20 LAB — HM DIABETES EYE EXAM

## 2017-10-21 DIAGNOSIS — M9905 Segmental and somatic dysfunction of pelvic region: Secondary | ICD-10-CM | POA: Diagnosis not present

## 2017-10-21 DIAGNOSIS — M5137 Other intervertebral disc degeneration, lumbosacral region: Secondary | ICD-10-CM | POA: Diagnosis not present

## 2017-10-21 DIAGNOSIS — M9903 Segmental and somatic dysfunction of lumbar region: Secondary | ICD-10-CM | POA: Diagnosis not present

## 2017-10-21 DIAGNOSIS — M9904 Segmental and somatic dysfunction of sacral region: Secondary | ICD-10-CM | POA: Diagnosis not present

## 2017-10-25 DIAGNOSIS — M9904 Segmental and somatic dysfunction of sacral region: Secondary | ICD-10-CM | POA: Diagnosis not present

## 2017-10-25 DIAGNOSIS — M9905 Segmental and somatic dysfunction of pelvic region: Secondary | ICD-10-CM | POA: Diagnosis not present

## 2017-10-25 DIAGNOSIS — M9903 Segmental and somatic dysfunction of lumbar region: Secondary | ICD-10-CM | POA: Diagnosis not present

## 2017-10-25 DIAGNOSIS — M5137 Other intervertebral disc degeneration, lumbosacral region: Secondary | ICD-10-CM | POA: Diagnosis not present

## 2017-10-27 DIAGNOSIS — M9904 Segmental and somatic dysfunction of sacral region: Secondary | ICD-10-CM | POA: Diagnosis not present

## 2017-10-27 DIAGNOSIS — M9903 Segmental and somatic dysfunction of lumbar region: Secondary | ICD-10-CM | POA: Diagnosis not present

## 2017-10-27 DIAGNOSIS — M9905 Segmental and somatic dysfunction of pelvic region: Secondary | ICD-10-CM | POA: Diagnosis not present

## 2017-10-27 DIAGNOSIS — M5137 Other intervertebral disc degeneration, lumbosacral region: Secondary | ICD-10-CM | POA: Diagnosis not present

## 2017-10-28 DIAGNOSIS — M9905 Segmental and somatic dysfunction of pelvic region: Secondary | ICD-10-CM | POA: Diagnosis not present

## 2017-10-28 DIAGNOSIS — M9903 Segmental and somatic dysfunction of lumbar region: Secondary | ICD-10-CM | POA: Diagnosis not present

## 2017-10-28 DIAGNOSIS — M5137 Other intervertebral disc degeneration, lumbosacral region: Secondary | ICD-10-CM | POA: Diagnosis not present

## 2017-10-28 DIAGNOSIS — M9904 Segmental and somatic dysfunction of sacral region: Secondary | ICD-10-CM | POA: Diagnosis not present

## 2017-11-03 DIAGNOSIS — M5137 Other intervertebral disc degeneration, lumbosacral region: Secondary | ICD-10-CM | POA: Diagnosis not present

## 2017-11-03 DIAGNOSIS — M9903 Segmental and somatic dysfunction of lumbar region: Secondary | ICD-10-CM | POA: Diagnosis not present

## 2017-11-03 DIAGNOSIS — M9905 Segmental and somatic dysfunction of pelvic region: Secondary | ICD-10-CM | POA: Diagnosis not present

## 2017-11-03 DIAGNOSIS — M9904 Segmental and somatic dysfunction of sacral region: Secondary | ICD-10-CM | POA: Diagnosis not present

## 2017-11-09 ENCOUNTER — Other Ambulatory Visit: Payer: Self-pay | Admitting: Cardiovascular Disease

## 2017-11-15 DIAGNOSIS — I2129 ST elevation (STEMI) myocardial infarction involving other sites: Secondary | ICD-10-CM | POA: Diagnosis not present

## 2017-11-15 DIAGNOSIS — I10 Essential (primary) hypertension: Secondary | ICD-10-CM | POA: Diagnosis not present

## 2017-11-15 DIAGNOSIS — E1159 Type 2 diabetes mellitus with other circulatory complications: Secondary | ICD-10-CM | POA: Diagnosis not present

## 2017-11-15 DIAGNOSIS — R69 Illness, unspecified: Secondary | ICD-10-CM | POA: Diagnosis not present

## 2017-11-15 DIAGNOSIS — J449 Chronic obstructive pulmonary disease, unspecified: Secondary | ICD-10-CM | POA: Diagnosis not present

## 2017-11-15 DIAGNOSIS — E785 Hyperlipidemia, unspecified: Secondary | ICD-10-CM | POA: Diagnosis not present

## 2017-11-15 DIAGNOSIS — E1169 Type 2 diabetes mellitus with other specified complication: Secondary | ICD-10-CM | POA: Diagnosis not present

## 2018-01-17 DIAGNOSIS — J209 Acute bronchitis, unspecified: Secondary | ICD-10-CM | POA: Diagnosis not present

## 2018-01-17 DIAGNOSIS — E1159 Type 2 diabetes mellitus with other circulatory complications: Secondary | ICD-10-CM | POA: Diagnosis not present

## 2018-01-17 DIAGNOSIS — I10 Essential (primary) hypertension: Secondary | ICD-10-CM | POA: Diagnosis not present

## 2018-01-28 DIAGNOSIS — J329 Chronic sinusitis, unspecified: Secondary | ICD-10-CM | POA: Diagnosis not present

## 2018-01-28 DIAGNOSIS — E1159 Type 2 diabetes mellitus with other circulatory complications: Secondary | ICD-10-CM | POA: Diagnosis not present

## 2018-01-28 DIAGNOSIS — H8309 Labyrinthitis, unspecified ear: Secondary | ICD-10-CM | POA: Diagnosis not present

## 2018-01-28 DIAGNOSIS — I10 Essential (primary) hypertension: Secondary | ICD-10-CM | POA: Diagnosis not present

## 2018-02-08 DIAGNOSIS — R899 Unspecified abnormal finding in specimens from other organs, systems and tissues: Secondary | ICD-10-CM | POA: Diagnosis not present

## 2018-02-08 DIAGNOSIS — E1159 Type 2 diabetes mellitus with other circulatory complications: Secondary | ICD-10-CM | POA: Diagnosis not present

## 2018-02-08 DIAGNOSIS — I10 Essential (primary) hypertension: Secondary | ICD-10-CM | POA: Diagnosis not present

## 2018-02-13 ENCOUNTER — Other Ambulatory Visit: Payer: Self-pay | Admitting: Cardiovascular Disease

## 2018-02-14 DIAGNOSIS — J449 Chronic obstructive pulmonary disease, unspecified: Secondary | ICD-10-CM | POA: Diagnosis not present

## 2018-02-14 DIAGNOSIS — I10 Essential (primary) hypertension: Secondary | ICD-10-CM | POA: Diagnosis not present

## 2018-02-14 DIAGNOSIS — E1159 Type 2 diabetes mellitus with other circulatory complications: Secondary | ICD-10-CM | POA: Diagnosis not present

## 2018-02-14 DIAGNOSIS — E785 Hyperlipidemia, unspecified: Secondary | ICD-10-CM | POA: Diagnosis not present

## 2018-02-14 DIAGNOSIS — E1169 Type 2 diabetes mellitus with other specified complication: Secondary | ICD-10-CM | POA: Diagnosis not present

## 2018-02-14 DIAGNOSIS — D649 Anemia, unspecified: Secondary | ICD-10-CM | POA: Diagnosis not present

## 2018-02-15 DIAGNOSIS — R69 Illness, unspecified: Secondary | ICD-10-CM | POA: Diagnosis not present

## 2018-02-16 DIAGNOSIS — Z1211 Encounter for screening for malignant neoplasm of colon: Secondary | ICD-10-CM | POA: Diagnosis not present

## 2018-04-26 ENCOUNTER — Observation Stay (HOSPITAL_COMMUNITY)
Admission: EM | Admit: 2018-04-26 | Discharge: 2018-04-27 | Disposition: A | Discharge status: Home / Self Care | Payer: Medicare HMO | Attending: Internal Medicine | Admitting: Internal Medicine

## 2018-04-26 ENCOUNTER — Observation Stay (HOSPITAL_BASED_OUTPATIENT_CLINIC_OR_DEPARTMENT_OTHER): Payer: Medicare HMO

## 2018-04-26 ENCOUNTER — Other Ambulatory Visit: Payer: Self-pay

## 2018-04-26 ENCOUNTER — Emergency Department (HOSPITAL_COMMUNITY): Payer: Medicare HMO

## 2018-04-26 ENCOUNTER — Encounter (HOSPITAL_COMMUNITY): Payer: Self-pay | Admitting: Emergency Medicine

## 2018-04-26 DIAGNOSIS — J449 Chronic obstructive pulmonary disease, unspecified: Secondary | ICD-10-CM | POA: Diagnosis present

## 2018-04-26 DIAGNOSIS — R0789 Other chest pain: Secondary | ICD-10-CM | POA: Diagnosis not present

## 2018-04-26 DIAGNOSIS — E1169 Type 2 diabetes mellitus with other specified complication: Secondary | ICD-10-CM

## 2018-04-26 DIAGNOSIS — R69 Illness, unspecified: Secondary | ICD-10-CM | POA: Diagnosis not present

## 2018-04-26 DIAGNOSIS — E119 Type 2 diabetes mellitus without complications: Secondary | ICD-10-CM | POA: Diagnosis not present

## 2018-04-26 DIAGNOSIS — F17210 Nicotine dependence, cigarettes, uncomplicated: Secondary | ICD-10-CM | POA: Insufficient documentation

## 2018-04-26 DIAGNOSIS — R072 Precordial pain: Secondary | ICD-10-CM

## 2018-04-26 DIAGNOSIS — I10 Essential (primary) hypertension: Secondary | ICD-10-CM | POA: Insufficient documentation

## 2018-04-26 DIAGNOSIS — R079 Chest pain, unspecified: Secondary | ICD-10-CM | POA: Diagnosis not present

## 2018-04-26 DIAGNOSIS — I25119 Atherosclerotic heart disease of native coronary artery with unspecified angina pectoris: Secondary | ICD-10-CM

## 2018-04-26 DIAGNOSIS — E785 Hyperlipidemia, unspecified: Secondary | ICD-10-CM | POA: Diagnosis not present

## 2018-04-26 DIAGNOSIS — I2510 Atherosclerotic heart disease of native coronary artery without angina pectoris: Secondary | ICD-10-CM | POA: Diagnosis not present

## 2018-04-26 LAB — CBC WITH DIFFERENTIAL/PLATELET
BASOS PCT: 1 %
Basophils Absolute: 0.1 10*3/uL (ref 0.0–0.1)
EOS ABS: 1.4 10*3/uL — AB (ref 0.0–0.7)
EOS PCT: 13 %
HCT: 34.8 % — ABNORMAL LOW (ref 39.0–52.0)
Hemoglobin: 10.8 g/dL — ABNORMAL LOW (ref 13.0–17.0)
Lymphocytes Relative: 20 %
Lymphs Abs: 2.1 10*3/uL (ref 0.7–4.0)
MCH: 26.7 pg (ref 26.0–34.0)
MCHC: 31 g/dL (ref 30.0–36.0)
MCV: 86.1 fL (ref 78.0–100.0)
MONOS PCT: 7 %
Monocytes Absolute: 0.7 10*3/uL (ref 0.1–1.0)
NEUTROS PCT: 59 %
Neutro Abs: 6.4 10*3/uL (ref 1.7–7.7)
PLATELETS: 267 10*3/uL (ref 150–400)
RBC: 4.04 MIL/uL — AB (ref 4.22–5.81)
RDW: 16.3 % — ABNORMAL HIGH (ref 11.5–15.5)
WBC: 10.5 10*3/uL (ref 4.0–10.5)

## 2018-04-26 LAB — ECHOCARDIOGRAM COMPLETE
HEIGHTINCHES: 71 in
Weight: 2991.2 oz

## 2018-04-26 LAB — I-STAT TROPONIN, ED: TROPONIN I, POC: 0 ng/mL (ref 0.00–0.08)

## 2018-04-26 LAB — COMPREHENSIVE METABOLIC PANEL
ALK PHOS: 67 U/L (ref 38–126)
ALT: 13 U/L (ref 0–44)
AST: 16 U/L (ref 15–41)
Albumin: 4 g/dL (ref 3.5–5.0)
Anion gap: 8 (ref 5–15)
BUN: 13 mg/dL (ref 8–23)
CALCIUM: 9.5 mg/dL (ref 8.9–10.3)
CO2: 23 mmol/L (ref 22–32)
CREATININE: 0.77 mg/dL (ref 0.61–1.24)
Chloride: 105 mmol/L (ref 98–111)
Glucose, Bld: 108 mg/dL — ABNORMAL HIGH (ref 70–99)
Potassium: 4.5 mmol/L (ref 3.5–5.1)
Sodium: 136 mmol/L (ref 135–145)
Total Bilirubin: 0.6 mg/dL (ref 0.3–1.2)
Total Protein: 7.6 g/dL (ref 6.5–8.1)

## 2018-04-26 LAB — TROPONIN I
Troponin I: <0.03 ng/mL (ref <0.03)
Troponin I: <0.03 ng/mL (ref <0.03)

## 2018-04-26 LAB — I-STAT CG4 LACTIC ACID, ED: Lactic Acid, Venous: 1.53 mmol/L (ref 0.5–1.9)

## 2018-04-26 LAB — GLUCOSE, CAPILLARY
Glucose-Capillary: 167 mg/dL — ABNORMAL HIGH (ref 70–99)
Glucose-Capillary: 96 mg/dL (ref 70–99)

## 2018-04-26 MED ORDER — ALBUTEROL SULFATE (2.5 MG/3ML) 0.083% IN NEBU: 2.5000 mg | INHALATION_SOLUTION | Freq: Four times a day (QID) | RESPIRATORY_TRACT | Status: DC | PRN

## 2018-04-26 MED ORDER — INSULIN ASPART 100 UNIT/ML ~~LOC~~ SOLN: 0.0000 [IU] | Freq: Every day | SUBCUTANEOUS | Status: DC

## 2018-04-26 MED ORDER — INSULIN ASPART 100 UNIT/ML ~~LOC~~ SOLN
0.0000 [IU] | Freq: Three times a day (TID) | SUBCUTANEOUS | Status: DC
  Administered 2018-04-26: 3 [IU] via SUBCUTANEOUS

## 2018-04-26 MED ORDER — MORPHINE SULFATE (PF) 2 MG/ML IV SOLN: 2.0000 mg | INTRAVENOUS | Status: DC | PRN

## 2018-04-26 MED ORDER — LISINOPRIL 5 MG PO TABS: 2.5000 mg | ORAL_TABLET | Freq: Every day | ORAL | Status: DC

## 2018-04-26 MED ORDER — GI COCKTAIL ~~LOC~~: 30.0000 mL | Freq: Four times a day (QID) | ORAL | Status: DC | PRN

## 2018-04-26 MED ORDER — MOMETASONE FURO-FORMOTEROL FUM 200-5 MCG/ACT IN AERO
2.0000 | INHALATION_SPRAY | Freq: Two times a day (BID) | RESPIRATORY_TRACT | Status: DC
  Administered 2018-04-26 – 2018-04-27 (×2): 2 via RESPIRATORY_TRACT
  Filled 2018-04-26: qty 8.8

## 2018-04-26 MED ORDER — TICAGRELOR 90 MG PO TABS
90.0000 mg | ORAL_TABLET | Freq: Two times a day (BID) | ORAL | Status: DC
  Administered 2018-04-26: 90 mg via ORAL
  Filled 2018-04-26: qty 1

## 2018-04-26 MED ORDER — NITROGLYCERIN 2 % TD OINT
0.5000 [in_us] | TOPICAL_OINTMENT | Freq: Once | TRANSDERMAL | Status: AC
  Administered 2018-04-26: 0.5 [in_us] via TOPICAL
  Filled 2018-04-26: qty 1

## 2018-04-26 MED ORDER — ONDANSETRON HCL 4 MG/2ML IJ SOLN: 4.0000 mg | Freq: Four times a day (QID) | INTRAMUSCULAR | Status: DC | PRN

## 2018-04-26 MED ORDER — ACETAMINOPHEN 325 MG PO TABS: 650.0000 mg | ORAL_TABLET | ORAL | Status: DC | PRN

## 2018-04-26 MED ORDER — NITROGLYCERIN 2 % TD OINT
0.5000 [in_us] | TOPICAL_OINTMENT | Freq: Three times a day (TID) | TRANSDERMAL | Status: DC
  Administered 2018-04-26 – 2018-04-27 (×3): 0.5 [in_us] via TOPICAL
  Filled 2018-04-26 (×3): qty 1

## 2018-04-26 MED ORDER — ENOXAPARIN SODIUM 40 MG/0.4ML ~~LOC~~ SOLN
40.0000 mg | SUBCUTANEOUS | Status: DC
  Administered 2018-04-26: 40 mg via SUBCUTANEOUS
  Filled 2018-04-26: qty 0.4

## 2018-04-26 MED ORDER — ATORVASTATIN CALCIUM 40 MG PO TABS: 80.0000 mg | ORAL_TABLET | Freq: Every day | ORAL | Status: DC

## 2018-04-26 MED ORDER — ASPIRIN EC 81 MG PO TBEC: 81.0000 mg | DELAYED_RELEASE_TABLET | Freq: Every day | ORAL | Status: DC

## 2018-04-26 NOTE — H&P (Signed)
History and Physical    Charles Castro KJZ:[id number] DOB: 1945-11-15 DOA: 04/26/2018  PCP: Dione Housekeeper, MD  Patient coming from: home  I have personally briefly reviewed patient's old medical records in [place]  Chief Complaint: chest pain  HPI: Charles Castro is a 72 y.o. male with medical history significant of hypertension, diabetes, coronary artery disease status post drug-eluting stent to RCA, COPD.  Patient presents to the hospital with complaints of chest discomfort.  Pain is described as chest pressure, mid chest and radiating to his right chest.  He had no associated shortness of breath or diaphoresis.  He was having frequent episodes of belching which did not provide any significant relief.  He took nitroglycerin and did find relief in his symptoms.  ED Course: On arrival to the emergency room, EKG was shown to be nonacute.  Troponin was found to be negative.  Chest x-ray showed no acute process.  He received Nitropaste and his pain has since resolved.  He is being referred for admission.  Review of Systems: As per HPI otherwise 10 point review of systems negative.    Past Medical History:  Diagnosis Date  . Anemia   . Colon polyp   . COPD (chronic obstructive pulmonary disease) ([place])   . Diverticulosis   . DM type 2 with diabetic dyslipidemia ([place])   . HTN (hypertension)   . MI (myocardial infarction) ([place]) 05/28/2016   DES to RCA  . Nicotine dependence   . STEMI (ST elevation myocardial infarction) ([place])   . Type 2 diabetes mellitus ([place])     Past Surgical History:  Procedure Laterality Date  . CARDIAC CATHETERIZATION N/A 05/28/2016   Procedure: Left Heart Cath and Coronary Angiography;  Surgeon: Nelva Bush, MD;  Location: [place] [facility];  Service: Cardiovascular;  Laterality: N/A;  . CARDIAC CATHETERIZATION N/A 05/28/2016   Procedure: Coronary Stent Intervention;  Surgeon: Nelva Bush, MD;  Location: [place] [facility];  Service:  Cardiovascular;  Laterality: N/A;    Social History:  reports that he has been smoking cigarettes. He started smoking about 49 years ago. He has a 15.00 pack-year smoking history. He has never used smokeless tobacco. He reports that he drinks alcohol. He reports that he does not use drugs.  No Known Allergies  Family History  Problem Relation Age of Onset  . CAD Father         with  multiple MI, first at age 31  . Diabetes Father   . CAD Brother        First Mi at age 26,  . Diabetes Brother   . Colon cancer Mother        in her 71s   . Diabetes Sister     Prior to Admission medications   Medication Sig Start Date End Date Taking? Authorizing Provider  aspirin EC 81 MG EC tablet Take 1 tablet (81 mg total) by mouth daily. 06/01/16  Yes Bhagat, Bhavinkumar, PA  atorvastatin (LIPITOR) 80 MG tablet Take 1 tablet (80 mg total) by mouth daily at 6 PM. Patient taking differently: Take 80 mg by mouth daily.  05/31/16  Yes Bhagat, Bhavinkumar, PA  BRILINTA 90 MG TABS tablet TAKE 1 TABLET BY MOUTH TWICE DAILY 11/09/17  Yes Josue Hector, MD  budesonide-formoterol [facility]) 160-4.5 MCG/ACT inhaler Inhale 2 puffs into the lungs daily as needed.  01/17/18  Yes [provider]  cholecalciferol (VITAMIN D) 1000 units tablet Take by mouth.   Yes  [provider]  glipiZIDE (GLUCOTROL XL) 10 MG 24 hr tablet Take 10 mg by mouth daily.    Yes [provider]  lisinopril (PRINIVIL,ZESTRIL) 2.5 MG tablet Take 2.5 mg by mouth daily.   Yes [provider]  metFORMIN (GLUCOPHAGE-XR) 500 MG 24 hr tablet Take 500-1,000 mg by mouth See admin instructions. Take 1 tablet (500 mg) with breakfast and 2 tablets (1000 mg) with supper   Yes [provider]  nitroGLYCERIN (NITROSTAT) 0.4 MG SL tablet Place 1 tablet (0.4 mg total) under the tongue every 5 (five) minutes x 3 doses as needed for chest pain. 10/13/17  Yes Josue Hector, MD  Omega-3 Fatty Acids (FISH OIL) 1000  MG CAPS Take 1,000 mg by mouth 2 (two) times daily.   Yes [provider]  BRILINTA 90 MG TABS tablet TAKE 1 TABLET BY MOUTH TWICE DAILY Patient not taking: Reported on 04/26/2018 02/14/18   Josue Hector, MD    Physical Exam: Vitals:   04/26/18 1015 04/26/18 1016 04/26/18 1030 04/26/18 1240  BP:  107/90 (!) 116/92 126/61  Pulse: (!) 56 (!) 56 (!) 56 (!) 50  Resp: 10 18 12 16   Temp:    97.7 F (36.5 C)  TempSrc:    Oral  SpO2: 98% 98% 97% 97%  Weight:    84.8 kg  Height:    5\' 11"  (1.803 m)    Constitutional: NAD, calm, comfortable Eyes: PERRL, lids and conjunctivae normal ENMT: Mucous membranes are moist. Posterior pharynx clear of any exudate or lesions.Normal dentition.  Neck: normal, supple, no masses, no thyromegaly Respiratory: clear to auscultation bilaterally, no wheezing, no crackles. Normal respiratory effort. No accessory muscle use.  Cardiovascular: Regular rate and rhythm, no murmurs / rubs / gallops. No extremity edema. 2+ pedal pulses. No carotid bruits.  Abdomen: no tenderness, no masses palpated. No hepatosplenomegaly. Bowel sounds positive.  Musculoskeletal: no clubbing / cyanosis. No joint deformity upper and lower extremities. Good ROM, no contractures. Normal muscle tone.  Skin: no rashes, lesions, ulcers. No induration Neurologic: CN 2-12 grossly intact. Sensation intact, DTR normal. Strength 5/5 in all 4.  Psychiatric: Normal judgment and insight. Alert and oriented x 3. Normal mood.    Labs on Admission: I have personally reviewed following labs and imaging studies  CBC: Recent Labs  Lab 04/26/18 0929  WBC 10.5  NEUTROABS 6.4  HGB 10.8*  HCT 34.8*  MCV 86.1  PLT 297   Basic Metabolic Panel: Recent Labs  Lab 04/26/18 0929  NA 136  K 4.5  CL 105  CO2 23  GLUCOSE 108*  BUN 13  CREATININE 0.77  CALCIUM 9.5   GFR: Estimated Creatinine Clearance: 92.8 mL/min (by C-G formula based on SCr of 0.77 mg/dL). Liver Function  Tests: Recent Labs  Lab 04/26/18 0929  AST 16  ALT 13  ALKPHOS 67  BILITOT 0.6  PROT 7.6  ALBUMIN 4.0   No results for input(s): LIPASE, AMYLASE in the last 168 hours. No results for input(s): AMMONIA in the last 168 hours. Coagulation Profile: No results for input(s): INR, PROTIME in the last 168 hours. Cardiac Enzymes: No results for input(s): CKTOTAL, CKMB, CKMBINDEX, TROPONINI in the last 168 hours. BNP (last 3 results) No results for input(s): PROBNP in the last 8760 hours. HbA1C: No results for input(s): HGBA1C in the last 72 hours. CBG: No results for input(s): GLUCAP in the last 168 hours. Lipid Profile: No results for input(s): CHOL, HDL, LDLCALC, TRIG, CHOLHDL, LDLDIRECT  in the last 72 hours. Thyroid Function Tests: No results for input(s): TSH, T4TOTAL, FREET4, T3FREE, THYROIDAB in the last 72 hours. Anemia Panel: No results for input(s): VITAMINB12, FOLATE, FERRITIN, TIBC, IRON, RETICCTPCT in the last 72 hours. Urine analysis:    Component Value Date/Time   COLORURINE YELLOW 05/20/2010 [place] 05/20/2010 1357   LABSPEC 1.020 05/20/2010 1357   PHURINE 6.0 05/20/2010 1357   GLUCOSEU 500 (A) 05/20/2010 1357   HGBUR LARGE (A) 05/20/2010 1357   BILIRUBINUR NEGATIVE 05/20/2010 1357   KETONESUR TRACE (A) 05/20/2010 1357   PROTEINUR NEGATIVE 05/20/2010 1357   UROBILINOGEN 0.2 05/20/2010 1357   NITRITE NEGATIVE 05/20/2010 1357   LEUKOCYTESUR NEGATIVE 05/20/2010 1357    Radiological Exams on Admission: Dg Chest 2 View  Result Date: 04/26/2018 CLINICAL DATA:  Mid chest discomfort radiating toward the left. History of coronary artery disease with stent placement in 2017. Current smoker. History of COPD. Previous STEMI. EXAM: CHEST - 2 VIEW COMPARISON:  Chest x-ray of July 07, 2017 FINDINGS: The lungs are well-expanded. There is mild interstitial prominence which is stable. There is no alveolar infiltrate nor pleural effusion. The heart and  pulmonary vascularity are normal. There is calcification in the wall of the aortic arch. The bony thorax exhibits no acute abnormality. IMPRESSION: Mild chronic bronchitic-smoking related changes, stable. No pneumonia, CHF, nor other acute cardiopulmonary abnormality. Thoracic aortic atherosclerosis. Electronically Signed   By: David  Martinique M.D.   On: 04/26/2018 10:00    EKG: Independently reviewed. Sinus rhythm with RBBB  Assessment/Plan Active Problems:   DM (diabetes mellitus) (HCC)   Chest pain   COPD (chronic obstructive pulmonary disease) (H[facility])   CAD (coronary artery disease)   HLD (hyperlipidemia)     1. Chest discomfort.  Unclear etiology.  He does have a history of coronary artery disease.  Plan to cycle cardiac enzymes.  Echocardiogram is been ordered.  Cardiology has seen the patient.  May consider stress testing in the morning.  We will keep n.p.o. after midnight.  Continue dual antiplatelet therapy. 2. COPD.  No wheezing at the time of my exam.  Continue bronchodilators and inhaled steroids. 3. Diabetes.  Hold oral agents.  Provide sliding scale insulin. 4. Hyperlipidemia.  Continue statin  DVT prophylaxis: lovenox  Code Status: full code  Family Communication: No family present Disposition Plan: Discharge home once work-up complete Consults called: Cardiology Admission status: Observation, telemetry  Kathie Dike MD [facility] Pager [phone]  If 7PM-7AM, please contact night-coverage [email or web address] Password [email or web address]  04/26/2018, 4:13 PM

## 2018-04-26 NOTE — Progress Notes (Signed)
  Echocardiogram 2D Echocardiogram has been performed.  [name] M 04/26/2018, 2:28 PM

## 2018-04-26 NOTE — ED Triage Notes (Signed)
Pt reports belching for past 14 hours and pain in center of back. Pt reports right sided chest pain radiating into back with some slightness SOB. Pt took SL nitro at home with relief in some of his chest pain and belching.

## 2018-04-26 NOTE — ED Provider Notes (Signed)
Emergency Department Provider Note   I have reviewed the triage vital signs and the nursing notes.   HISTORY  Chief Complaint Chest Pain   HPI Charles Castro is a 72 y.o. male with PMH of COPD, DM, HTN, and STEMI in 2017 with DES placement at that time resents to the emergency department with right-sided chest pain and belching starting last night.  Patient states that he had some more mild symptoms yesterday evening but was able to go to bed without difficulty.  When he woke up this morning he describes more severe right-sided chest pressure radiating around to the back.  Patient states he has a more focal pain in the back and has been having belching.  He denies any relief with belching and tried nitroglycerin which did improve his symptoms.  He does continue to have some mild right sided chest tightness at the time of ED presentation.  He states this feels unlike his pain when he had the STEMI in 2017. He is compliant with all medications. Does also have a history of GERD but states this does not feel like his typical reflux symptoms.    Past Medical History:  Diagnosis Date  . Anemia   . Colon polyp   . COPD (chronic obstructive pulmonary disease) ([place])   . Diverticulosis   . DM type 2 with diabetic dyslipidemia ([place])   . HTN (hypertension)   . MI (myocardial infarction) ([place]) 05/28/2016   DES to RCA  . Nicotine dependence   . STEMI (ST elevation myocardial infarction) ([place])   . Type 2 diabetes mellitus [facility])     Patient Active Problem List   Diagnosis Date Noted  . DM (diabetes mellitus) ([place])   . STEMI (ST elevation myocardial infarction) ([place]) 05/28/2016    Past Surgical History:  Procedure Laterality Date  . CARDIAC CATHETERIZATION N/A 05/28/2016   Procedure: Left Heart Cath and Coronary Angiography;  Surgeon: Nelva Bush, MD;  Location: [place] [facility];  Service: Cardiovascular;  Laterality: N/A;  . CARDIAC CATHETERIZATION N/A 05/28/2016   Procedure:  Coronary Stent Intervention;  Surgeon: Nelva Bush, MD;  Location: [place] [facility];  Service: Cardiovascular;  Laterality: N/A;   Allergies Patient has no known allergies.  Family History  Problem Relation Age of Onset  . CAD Father         with  multiple MI, first at age 26  . Diabetes Father   . CAD Brother        First Mi at age 44,  . Diabetes Brother   . Colon cancer Mother        in her 27s   . Diabetes Sister     Social History Social History   Tobacco Use  . Smoking status: Current Every Day Smoker    Packs/day: 0.50    Years: 30.00    Pack years: 15.00    Types: Cigarettes    Start date: 05/15/1968  . Smokeless tobacco: Never Used  Substance Use Topics  . Alcohol use: Yes    Comment: 5 drinks per year  . Drug use: No    Review of Systems  Constitutional: No fever/chills Eyes: No visual changes. ENT: No sore throat. Cardiovascular: Positive chest pain. Respiratory: Denies shortness of breath. Gastrointestinal: No abdominal pain.  No nausea, no vomiting.  No diarrhea.  No constipation. Positive frequent belching.  Genitourinary: Negative for dysuria. Musculoskeletal: Negative for back pain. Skin: Negative for rash. Neurological: Negative for headaches, focal weakness or  numbness.  10-point ROS otherwise negative.  ____________________________________________   PHYSICAL EXAM:  VITAL SIGNS: ED Triage Vitals  Enc Vitals Group     BP 04/26/18 0928 135/69     Pulse Rate 04/26/18 0928 (!) 58     Resp 04/26/18 0928 16     Temp 04/26/18 0928 97.8 F (36.6 C)     Temp Source 04/26/18 0928 Oral     SpO2 04/26/18 0928 95 %     Weight 04/26/18 0925 187 lb (84.8 kg)     Height 04/26/18 0925 5\' 11"  (1.803 m)     Pain Score 04/26/18 0925 2   Constitutional: Alert and oriented. Well appearing and in no acute distress. Eyes: Conjunctivae are normal.  Head: Atraumatic. Nose: No congestion/rhinnorhea. Mouth/Throat: Mucous membranes are moist.    Neck: No stridor.  Cardiovascular: Normal rate, regular rhythm. Good peripheral circulation. Grossly normal heart sounds.   Respiratory: Normal respiratory effort.  No retractions. Lungs CTAB. Gastrointestinal: Soft and nontender. No distention. Negative Murphy's sign.  Musculoskeletal: No lower extremity tenderness nor edema. No gross deformities of extremities. Neurologic:  Normal speech and language. No gross focal neurologic deficits are appreciated.  Skin:  Skin is warm, dry and intact. No rash noted.  ____________________________________________   LABS (all labs ordered are listed, but only abnormal results are displayed)  Labs Reviewed  COMPREHENSIVE METABOLIC PANEL - Abnormal; Notable for the following components:      Result Value   Glucose, Bld 108 (*)    All other components within normal limits  CBC WITH DIFFERENTIAL/PLATELET - Abnormal; Notable for the following components:   RBC 4.04 (*)    Hemoglobin 10.8 (*)    HCT 34.8 (*)    RDW 16.3 (*)    Eosinophils Absolute 1.4 (*)    All other components within normal limits  I-STAT TROPONIN, ED  I-STAT CG4 LACTIC ACID, ED   ____________________________________________  EKG   EKG Interpretation  Date/Time:  Tuesday April 26 2018 09:22:41 EDT Ventricular Rate:  60 PR Interval:    QRS Duration: 149 QT Interval:  431 QTC Calculation: 431 R Axis:   79 Text Interpretation:  Sinus rhythm Right bundle branch block Baseline wander in lead(s) V4 No STEMI. Similar to prior.  Confirmed by Nanda Quinton [phone]) on 04/26/2018 9:30:03 AM       ____________________________________________  RADIOLOGY  Dg Chest 2 View  Result Date: 04/26/2018 CLINICAL DATA:  Mid chest discomfort radiating toward the left. History of coronary artery disease with stent placement in 2017. Current smoker. History of COPD. Previous STEMI. EXAM: CHEST - 2 VIEW COMPARISON:  Chest x-ray of July 07, 2017 FINDINGS: The lungs are well-expanded.  There is mild interstitial prominence which is stable. There is no alveolar infiltrate nor pleural effusion. The heart and pulmonary vascularity are normal. There is calcification in the wall of the aortic arch. The bony thorax exhibits no acute abnormality. IMPRESSION: Mild chronic bronchitic-smoking related changes, stable. No pneumonia, CHF, nor other acute cardiopulmonary abnormality. Thoracic aortic atherosclerosis. Electronically Signed   By: David  Martinique M.D.   On: 04/26/2018 10:00    ____________________________________________   PROCEDURES  Procedure(s) performed:   Procedures  None ____________________________________________   INITIAL IMPRESSION / ASSESSMENT AND PLAN / ED COURSE  Pertinent labs & imaging results that were available during my care of the patient were reviewed by me and considered in my medical decision making (see chart for details).  Patient presents to the emergency department with right-sided chest pain  radiating to the back.  Patient also with frequent belching but belching does not improve his chest pain symptoms.  He has a significant history of CAD as well as GERD but states this feels unlike either of those entities. Plan for troponin here. EKG similar to prior.   Patient CP resolved with nitro patch. No CP at this time. Will consult Cardiology.   10:56 AM Discussed case with Dr. Harl Bowie. Agree with plan for obs admit. Patient CP free at this time. Cardiology will consult.   Discussed patient's case with Hospitalist to request admission. Patient and family (if present) updated with plan. Care transferred to Hospitalist service.  I reviewed all nursing notes, vitals, pertinent old records, EKGs, labs, imaging (as available).  ____________________________________________  FINAL CLINICAL IMPRESSION(S) / ED DIAGNOSES  Final diagnoses:  Precordial chest pain    MEDICATIONS GIVEN DURING THIS VISIT:  Medications  nitroGLYCERIN (NITROGLYN) 2 %  ointment 0.5 inch (0.5 inches Topical Given 04/26/18 0956)    Note:  This document was prepared using Dragon voice recognition software and may include unintentional dictation errors.  Nanda Quinton, MD Emergency Medicine    Frandy Basnett, [facility], MD 04/26/18 1056

## 2018-04-26 NOTE — Consult Note (Addendum)
Cardiology Consultation:   Patient ID: Charles Castro MRN: [id number]; DOB: 04/06/1946  Admit date: 04/26/2018 Date of Consult: 04/26/2018  Primary Care Provider: Dione Housekeeper, MD Primary Cardiologist: Jenkins Rouge, MD  Primary Electrophysiologist:  na   Patient Profile:   Charles Castro is a 72 y.o. male with a hx of CAD with prior pci who is being seen today for the evaluation of chest pain at the request of Dr Laverta Baltimore.  History of Present Illness:   Charles Castro 71 yo male history of CAD with STEMI in 05/2016 with occluded RCA s/p DES and mild to moderate diffuse disease ,DM2, COPD, GERD, presents with chest pain.  Symptoms started last night shortly after eating a taco. 8/10 chest pressure midchest radiating into right chest. No SOB, diaphoresis. Symptoms would come and go, could last more than 1 hour. Increased belching without relief. Was able to sleep overnight, awoke this morning with ongoing symptoms. Took a nitroglycerin with some relief at home. Came to ER, NG patch placed with some relief. Currently pain free.    K 4.5 Cr 0.77 WBC 10.5 Plt 267 Trop neg x 1 EKG SR, RBBB CXR no acute process 05/2016 echo LVEF 03-00%, grade I diastolic dysfunction 92/3300 cath: 100% RCA s/p DES. LM 20%, LAD 30%, mild LCX disease  Past Medical History:  Diagnosis Date  . Anemia   . Colon polyp   . COPD (chronic obstructive pulmonary disease) ([place])   . Diverticulosis   . DM type 2 with diabetic dyslipidemia ([place])   . HTN (hypertension)   . MI (myocardial infarction) ([place]) 05/28/2016   DES to RCA  . Nicotine dependence   . STEMI (ST elevation myocardial infarction) ([place])   . Type 2 diabetes mellitus ([place])     Past Surgical History:  Procedure Laterality Date  . CARDIAC CATHETERIZATION N/A 05/28/2016   Procedure: Left Heart Cath and Coronary Angiography;  Surgeon: Nelva Bush, MD;  Location: [place] [facility];  Service: Cardiovascular;  Laterality: N/A;  . CARDIAC CATHETERIZATION  N/A 05/28/2016   Procedure: Coronary Stent Intervention;  Surgeon: Nelva Bush, MD;  Location: [place] [facility];  Service: Cardiovascular;  Laterality: N/A;      Inpatient Medications: Scheduled Meds:  Continuous Infusions:  PRN Meds:   Allergies:   No Known Allergies  Social History:   Social History   Socioeconomic History  . Marital status: Widowed    Spouse name: Not on file  . Number of children: Not on file  . Years of education: Not on file  . Highest education level: Not on file  Occupational History  . Not on file  Social Needs  . Financial resource strain: Not on file  . Food insecurity:    Worry: Not on file    Inability: Not on file  . Transportation needs:    Medical: Not on file    Non-medical: Not on file  Tobacco Use  . Smoking status: Current Every Day Smoker    Packs/day: 0.50    Years: 30.00    Pack years: 15.00    Types: Cigarettes    Start date: 05/15/1968  . Smokeless tobacco: Never Used  Substance and Sexual Activity  . Alcohol use: Yes    Comment: 5 drinks per year  . Drug use: No  . Sexual activity: Not on file  Lifestyle  . Physical activity:    Days per week: Not on file    Minutes per session: Not on file  .  Stress: Not on file  Relationships  . Social connections:    Talks on phone: Not on file    Gets together: Not on file    Attends religious service: Not on file    Active member of club or organization: Not on file    Attends meetings of clubs or organizations: Not on file    Relationship status: Not on file  . Intimate partner violence:    Fear of current or ex partner: Not on file    Emotionally abused: Not on file    Physically abused: Not on file    Forced sexual activity: Not on file  Other Topics Concern  . Not on file  Social History Narrative  . Not on file    Family History:    Family History  Problem Relation Age of Onset  . CAD Father         with  multiple MI, first at age 70  . Diabetes  Father   . CAD Brother        First Mi at age 15,  . Diabetes Brother   . Colon cancer Mother        in her 6s   . Diabetes Sister      ROS:  Please see the history of present illness.   All other ROS reviewed and negative.     Physical Exam/Data:   Vitals:   04/26/18 0925 04/26/18 0928 04/26/18 1016  BP:  135/69 107/90  Pulse:  (!) 58 (!) 56  Resp:  16 18  Temp:  97.8 F (36.6 C)   TempSrc:  Oral   SpO2:  95% 98%  Weight: 84.8 kg    Height: 5\' 11"  (1.803 m)     No intake or output data in the 24 hours ending 04/26/18 1052 Filed Weights   04/26/18 0925  Weight: 84.8 kg   Body mass index is 26.08 kg/m.  General:  Well nourished, well developed, in no acute distress HEENT: normal Lymph: no adenopathy Neck: no JVD Endocrine:  No thryomegaly Cardiac:  normal S1, S2; RRR; no murmur Lungs:  clear to auscultation bilaterally, no wheezing, rhonchi or rales  Abd: soft, nontender, no hepatomegaly  Ext: no edema Musculoskeletal:  No deformities, BUE and BLE strength normal and equal Skin: warm and dry  Neuro:  CNs 2-12 intact, no focal abnormalities noted Psych:  Normal affect     Laboratory Data:  Chemistry Recent Labs  Lab 04/26/18 0929  NA 136  K 4.5  CL 105  CO2 23  GLUCOSE 108*  BUN 13  CREATININE 0.77  CALCIUM 9.5  GFRNONAA >60  GFRAA >60  ANIONGAP 8    Recent Labs  Lab 04/26/18 0929  PROT 7.6  ALBUMIN 4.0  AST 16  ALT 13  ALKPHOS 67  BILITOT 0.6   Hematology Recent Labs  Lab 04/26/18 0929  WBC 10.5  RBC 4.04*  HGB 10.8*  HCT 34.8*  MCV 86.1  MCH 26.7  MCHC 31.0  RDW 16.3*  PLT 267   Cardiac EnzymesNo results for input(s): TROPONINI in the last 168 hours.  Recent Labs  Lab 04/26/18 0950  TROPIPOC 0.00    BNPNo results for input(s): BNP, PROBNP in the last 168 hours.  DDimer No results for input(s): DDIMER in the last 168 hours.  Radiology/Studies:  Dg Chest 2 View  Result Date: 04/26/2018 CLINICAL DATA:  Mid chest  discomfort radiating toward the left. History of coronary artery disease with stent  placement in 2017. Current smoker. History of COPD. Previous STEMI. EXAM: CHEST - 2 VIEW COMPARISON:  Chest x-ray of July 07, 2017 FINDINGS: The lungs are well-expanded. There is mild interstitial prominence which is stable. There is no alveolar infiltrate nor pleural effusion. The heart and pulmonary vascularity are normal. There is calcification in the wall of the aortic arch. The bony thorax exhibits no acute abnormality. IMPRESSION: Mild chronic bronchitic-smoking related changes, stable. No pneumonia, CHF, nor other acute cardiopulmonary abnormality. Thoracic aortic atherosclerosis. Electronically Signed   By: David  Martinique M.D.   On: 04/26/2018 10:00    Assessment and Plan:   1. CAD/Chest pain - history of CAD with prior STEMI and DES to RCA in 2017 - primarily atypical symptoms suggesting GI etiology, however did have improvement with nitroglycerin. First significant chest pain since his heart attack in 2017 - enough concern for coronary ishcemia that I would recommend observation overnight with cycling of cardiac enzymes, EKG, repeat echo - continue medical therapy, his primary cardiologist has committed to extended DAPT which we will continue. - npo at midnight in case ischemic testing is indicated.   2. COPD - active wheezing on exam, defer management to primary team.  3. Bradycardia - chronic low normal to mild bradycardia in the past, continue to monitor at this time. Avoid av nodal agents.     For questions or updates, please contact [place] Please consult [email or web address] for contact info under     Signed, Carlyle Dolly, MD  04/26/2018 10:52 AM

## 2018-04-27 DIAGNOSIS — I25119 Atherosclerotic heart disease of native coronary artery with unspecified angina pectoris: Secondary | ICD-10-CM | POA: Diagnosis not present

## 2018-04-27 DIAGNOSIS — I2510 Atherosclerotic heart disease of native coronary artery without angina pectoris: Secondary | ICD-10-CM

## 2018-04-27 DIAGNOSIS — E785 Hyperlipidemia, unspecified: Secondary | ICD-10-CM | POA: Diagnosis not present

## 2018-04-27 DIAGNOSIS — J449 Chronic obstructive pulmonary disease, unspecified: Secondary | ICD-10-CM | POA: Diagnosis not present

## 2018-04-27 DIAGNOSIS — E1169 Type 2 diabetes mellitus with other specified complication: Secondary | ICD-10-CM | POA: Diagnosis not present

## 2018-04-27 DIAGNOSIS — R0789 Other chest pain: Secondary | ICD-10-CM | POA: Diagnosis not present

## 2018-04-27 LAB — GLUCOSE, CAPILLARY: GLUCOSE-CAPILLARY: 127 mg/dL — AB (ref 70–99)

## 2018-04-27 LAB — HIV ANTIBODY (ROUTINE TESTING W REFLEX): HIV Screen 4th Generation wRfx: NONREACTIVE

## 2018-04-27 NOTE — Discharge Summary (Signed)
Physician Discharge Summary  DEJOUR VOS RXV:[id number] DOB: [birth year] DOA: 04/26/2018  PCP: Dione Housekeeper, MD  Admit date: 04/26/2018 Discharge date: 04/27/2018  Admitted From: Home Disposition: Home  Recommendations for Outpatient Follow-up:  1. Outpatient follow-up with cardiology has been scheduled.   Discharge Condition: Stable CODE STATUS: Full code Diet recommendation: Heart healthy, carb modified  Brief/Interim Summary: 72 year old male with a history of hypertension, diabetes, coronary artery disease status post stenting to RCA, COPD.  Patient presented to the hospital with complaints of chest discomfort.  He reported that he had taken a nitroglycerin which helped improved his symptoms.  He was admitted to the hospital for further evaluation.  He ruled out for ACS with negative cardiac markers.  EKG did not show any acute findings.  Echocardiogram was also unrevealing.  Since admission, he has not had any recurrence of pain.  He was seen by cardiology who did not recommend any further inpatient cardiac work-up.  He will followed up as an outpatient by cardiology.  Patient is feeling significantly better.  His other medical problems have been stable.  He is otherwise stable for discharge.  Discharge Diagnoses:  Active Problems:   DM (diabetes mellitus) ([facility])   Chest pain   COPD (chronic obstructive pulmonary disease) ([facility])   CAD (coronary artery disease)   HLD (hyperlipidemia)    Discharge Instructions  Discharge Instructions    Diet - low sodium heart healthy   Complete by:  As directed    Increase activity slowly   Complete by:  As directed      Allergies as of 04/27/2018   No Known Allergies     Medication List    TAKE these medications   aspirin 81 MG EC tablet Take 1 tablet (81 mg total) by mouth daily.   atorvastatin 80 MG tablet Commonly known as:  LIPITOR Take 1 tablet (80 mg total) by mouth daily at 6 PM. What changed:  when to take this    BRILINTA 90 MG Tabs tablet Generic drug:  ticagrelor TAKE 1 TABLET BY MOUTH TWICE DAILY What changed:  Another medication with the same name was removed. Continue taking this medication, and follow the directions you see here.   budesonide-formoterol 160-4.5 MCG/ACT inhaler Commonly known as:  SYMBICORT Inhale 2 puffs into the lungs daily as needed.   cholecalciferol 1000 units tablet Commonly known as:  VITAMIN D Take by mouth.   Fish Oil 1000 MG Caps Take 1,000 mg by mouth 2 (two) times daily.   glipiZIDE 10 MG 24 hr tablet Commonly known as:  GLUCOTROL XL Take 10 mg by mouth daily.   lisinopril 2.5 MG tablet Commonly known as:  PRINIVIL,ZESTRIL Take 2.5 mg by mouth daily.   metFORMIN 500 MG 24 hr tablet Commonly known as:  GLUCOPHAGE-XR Take 500-1,000 mg by mouth See admin instructions. Take 1 tablet (500 mg) with breakfast and 2 tablets (1000 mg) with supper   nitroGLYCERIN 0.4 MG SL tablet Commonly known as:  NITROSTAT Place 1 tablet (0.4 mg total) under the tongue every 5 (five) minutes x 3 doses as needed for chest pain.      Follow-up Information    Erma Heritage, PA-C Follow up on 06/02/2018.   Specialties:  Physician Assistant, Cardiology Why:  Cardiology Hospital Follow-up on 06/02/2018 at 3:00PM.  Contact information: [address] [place] [address] [phone]          No Known Allergies  Consultations:  Cardiology   Procedures/Studies: Dg Chest  2 View  Result Date: 04/26/2018 CLINICAL DATA:  Mid chest discomfort radiating toward the left. History of coronary artery disease with stent placement in 2017. Current smoker. History of COPD. Previous STEMI. EXAM: CHEST - 2 VIEW COMPARISON:  Chest x-ray of July 07, 2017 FINDINGS: The lungs are well-expanded. There is mild interstitial prominence which is stable. There is no alveolar infiltrate nor pleural effusion. The heart and pulmonary vascularity are normal. There is calcification in  the wall of the aortic arch. The bony thorax exhibits no acute abnormality. IMPRESSION: Mild chronic bronchitic-smoking related changes, stable. No pneumonia, CHF, nor other acute cardiopulmonary abnormality. Thoracic aortic atherosclerosis. Electronically Signed   By: David  Martinique M.D.   On: 04/26/2018 10:00    Echo: - Left ventricle: The cavity size was normal. Wall thickness was   increased in a pattern of mild LVH. Systolic function was normal.   The estimated ejection fraction was 60%. Wall motion was normal;   there were no regional wall motion abnormalities. Left   ventricular diastolic function parameters were normal. - Aortic valve: Trileaflet; mildly thickened leaflets.   Subjective: No further chest discomfort.  No shortness of breath.  Discharge Exam: Vitals:   04/26/18 1930 04/26/18 2035 04/27/18 0626 04/27/18 0752  BP:  (!) 113/54 119/65   Pulse:  (!) 59 67   Resp:  18    Temp:  98.2 F (36.8 C) 98.1 F (36.7 C)   TempSrc:  Oral Oral   SpO2: 95% 96% 98% 97%  Weight:      Height:        General: Pt is alert, awake, not in acute distress Cardiovascular: RRR, S1/S2 +, no rubs, no gallops Respiratory: CTA bilaterally, no wheezing, no rhonchi Abdominal: Soft, NT, ND, bowel sounds + Extremities: no edema, no cyanosis    The results of significant diagnostics from this hospitalization (including imaging, microbiology, ancillary and laboratory) are listed below for reference.     Microbiology: No results found for this or any previous visit (from the past 240 hour(s)).   Labs: BNP (last 3 results) No results for input(s): BNP in the last 8760 hours. Basic Metabolic Panel: Recent Labs  Lab 04/26/18 0929  NA 136  K 4.5  CL 105  CO2 23  GLUCOSE 108*  BUN 13  CREATININE 0.77  CALCIUM 9.5   Liver Function Tests: Recent Labs  Lab 04/26/18 0929  AST 16  ALT 13  ALKPHOS 67  BILITOT 0.6  PROT 7.6  ALBUMIN 4.0   No results for input(s): LIPASE,  AMYLASE in the last 168 hours. No results for input(s): AMMONIA in the last 168 hours. CBC: Recent Labs  Lab 04/26/18 0929  WBC 10.5  NEUTROABS 6.4  HGB 10.8*  HCT 34.8*  MCV 86.1  PLT 267   Cardiac Enzymes: Recent Labs  Lab 04/26/18 1642 04/26/18 1849 04/26/18 2204  TROPONINI <0.03 <0.03 <0.03   BNP: Invalid input(s): POCBNP CBG: Recent Labs  Lab 04/26/18 1642 04/26/18 2037 04/27/18 0737  GLUCAP 167* 96 127*   D-Dimer No results for input(s): DDIMER in the last 72 hours. Hgb A1c No results for input(s): HGBA1C in the last 72 hours. Lipid Profile No results for input(s): CHOL, HDL, LDLCALC, TRIG, CHOLHDL, LDLDIRECT in the last 72 hours. Thyroid function studies No results for input(s): TSH, T4TOTAL, T3FREE, THYROIDAB in the last 72 hours.  Invalid input(s): FREET3 Anemia work up No results for input(s): VITAMINB12, FOLATE, FERRITIN, TIBC, IRON, RETICCTPCT in the last 72 hours.  Urinalysis    Component Value Date/Time   COLORURINE YELLOW 05/20/2010 [place] 05/20/2010 1357   LABSPEC 1.020 05/20/2010 1357   PHURINE 6.0 05/20/2010 1357   GLUCOSEU 500 (A) 05/20/2010 1357   HGBUR LARGE (A) 05/20/2010 1357   BILIRUBINUR NEGATIVE 05/20/2010 1357   KETONESUR TRACE (A) 05/20/2010 1357   PROTEINUR NEGATIVE 05/20/2010 1357   UROBILINOGEN 0.2 05/20/2010 1357   NITRITE NEGATIVE 05/20/2010 1357   LEUKOCYTESUR NEGATIVE 05/20/2010 1357   Sepsis Labs Invalid input(s): PROCALCITONIN,  WBC,  LACTICIDVEN Microbiology No results found for this or any previous visit (from the past 240 hour(s)).   Time coordinating discharge: 47mins  SIGNED:   Kathie Dike, MD  [facility] 04/27/2018, 7:36 PM Pager   If 7PM-7AM, please contact night-coverage [email or web address] Password [email or web address]

## 2018-04-27 NOTE — Progress Notes (Addendum)
Progress Note  Patient Name: Charles Castro Date of Encounter: 04/27/2018  Primary Cardiologist: Jenkins Rouge, MD   Subjective   He denies any recurrent chest pain overnight or this morning. No palpitations or dyspnea. Overall feeling back to baseline.  Inpatient Medications    Scheduled Meds: . aspirin EC  81 mg Oral Daily  . atorvastatin  80 mg Oral Daily  . enoxaparin (LOVENOX) injection  40 mg Subcutaneous Q24H  . insulin aspart  0-15 Units Subcutaneous TID WC  . insulin aspart  0-5 Units Subcutaneous QHS  . lisinopril  2.5 mg Oral Daily  . mometasone-formoterol  2 puff Inhalation BID  . nitroGLYCERIN  0.5 inch Topical Q8H  . ticagrelor  90 mg Oral BID   Continuous Infusions:  PRN Meds: acetaminophen, albuterol, gi cocktail, morphine injection, ondansetron (ZOFRAN) IV   Vital Signs    Vitals:   04/26/18 1930 04/26/18 2035 04/27/18 0626 04/27/18 0752  BP:  (!) 113/54 119/65   Pulse:  (!) 59 67   Resp:  18    Temp:  98.2 F (36.8 C) 98.1 F (36.7 C)   TempSrc:  Oral Oral   SpO2: 95% 96% 98% 97%  Weight:      Height:       No intake or output data in the 24 hours ending 04/27/18 0848 Filed Weights   04/26/18 0925 04/26/18 1240  Weight: 84.8 kg 84.8 kg    Telemetry    NSR, HR in mid-50's to 70's. Brief episode of atrial tachycardia overnight lasting for less than 8 seconds. - Personally Reviewed  ECG    NSR, HR 60, with known RBBB - Personally Reviewed  Physical Exam   General: Well developed, well nourished Caucasian male appearing in no acute distress. Head: Normocephalic, atraumatic.  Neck: Supple without bruits, JVD not elevated. Lungs:  Resp regular and unlabored, mild expiratory wheeze along upper lung fields. Heart: RRR, S1, S2, no S3, S4, or murmur; no rub. Abdomen: Soft, non-tender, non-distended with normoactive bowel sounds. No hepatomegaly. No rebound/guarding. No obvious abdominal masses. Extremities: No clubbing, cyanosis, or lower  extremity edema. Distal pedal pulses are 2+ bilaterally. Neuro: Alert and oriented X 3. Moves all extremities spontaneously. Psych: Normal affect.  Labs    Chemistry Recent Labs  Lab 04/26/18 0929  NA 136  K 4.5  CL 105  CO2 23  GLUCOSE 108*  BUN 13  CREATININE 0.77  CALCIUM 9.5  PROT 7.6  ALBUMIN 4.0  AST 16  ALT 13  ALKPHOS 67  BILITOT 0.6  GFRNONAA >60  GFRAA >60  ANIONGAP 8     Hematology Recent Labs  Lab 04/26/18 0929  WBC 10.5  RBC 4.04*  HGB 10.8*  HCT 34.8*  MCV 86.1  MCH 26.7  MCHC 31.0  RDW 16.3*  PLT 267    Cardiac Enzymes Recent Labs  Lab 04/26/18 1642 04/26/18 1849 04/26/18 2204  TROPONINI <0.03 <0.03 <0.03    Recent Labs  Lab 04/26/18 0950  TROPIPOC 0.00     BNPNo results for input(s): BNP, PROBNP in the last 168 hours.   DDimer No results for input(s): DDIMER in the last 168 hours.   Radiology    Dg Chest 2 View  Result Date: 04/26/2018 CLINICAL DATA:  Mid chest discomfort radiating toward the left. History of coronary artery disease with stent placement in 2017. Current smoker. History of COPD. Previous STEMI. EXAM: CHEST - 2 VIEW COMPARISON:  Chest x-ray of July 07, 2017 FINDINGS:  The lungs are well-expanded. There is mild interstitial prominence which is stable. There is no alveolar infiltrate nor pleural effusion. The heart and pulmonary vascularity are normal. There is calcification in the wall of the aortic arch. The bony thorax exhibits no acute abnormality. IMPRESSION: Mild chronic bronchitic-smoking related changes, stable. No pneumonia, CHF, nor other acute cardiopulmonary abnormality. Thoracic aortic atherosclerosis. Electronically Signed   By: David  Martinique M.D.   On: 04/26/2018 10:00    Cardiac Studies   Cardiac Catheterization: 05/2016 Conclusions: 1. Significant one vessel coronary artery disease with 60% proximal and 100% mid RCA stenoses. 2. Mild to moderate, nonobstructive CAD involving the LMCA, LAD, LCx,  and distal RCA. 3. Normal left ventricular filling pressure. 4. Successful PCI to proximal and mid RCA with placement of a Promus Premier 3.0 x 38 mm drug-eluting stent (post dilated to 3.5 mm) with 0% residual stenosis and TIMI-3 flow.  Recommendations: 1. Dual antiplatelet therapy with aspirin and ticagrelor for at least 12 months. 2. Aggressive secondary prevention. 3. Obtain transthoracic echocardiogram in AM for evaluation of LV function. 4. Remove right femoral vein sheath when ACT < 150 seconds.  Echocardiogram: 04/26/2018 Study Conclusions  - Left ventricle: The cavity size was normal. Wall thickness was   increased in a pattern of mild LVH. Systolic function was normal.   The estimated ejection fraction was 60%. Wall motion was normal;   there were no regional wall motion abnormalities. Left   ventricular diastolic function parameters were normal. - Aortic valve: Trileaflet; mildly thickened leaflets.  Patient Profile     72 y.o. male w/ PMH of CAD (s/p STEMI in 05/2016 with DES to mid-RCA), HTN, HLD, GERD, Type 2 DM, and tobacco use who presented to [facility] ED on 04/26/2018 for evaluation of chest pain.   Assessment & Plan    1. CAD/ Chest Pain with Mixed Features - patient developed chest pain after consuming a taco but symptoms persisted for over 24 hours and improved with SL NTG which promoted him to come to the ED for further evaluation. Did not resemble his prior anginal symptoms.   - initial and cyclic troponin values have been negative. EKG shows no acute ischemic changes when compared to prior tracings. Echocardiogram shows a preserved EF of 60% with no regional WMA.  - overall, his presentation seems atypical for a cardiac etiology. Feels back to baseline this morning. I have asked his nurse to remove his NTG patch and ambulate the patient in the hallway to make sure he does not have recurrent symptoms. He has remained on DAPT with ASA 81mg  daily and Brilinta 90mg   daily. Would defer to Primary Cardiologist but can consider reducing Brilinta to 60mg  daily given that he is almost 2 years out from his event.   2. HTN - BP has been well-controlled at 107/54 - 135/92 since admission. Remains on Lisinopril 2.5mg  daily. Would recommend avoiding AV nodal blocking agents in the future due to HR in the 50's intermittently during admission.   3. HLD - followed by PCP. FLP in 01/2018 showed total cholesterol of 104, Triglycerides 107, HDL 43, and LDL 40. At goal LDL of  LDL < 70 with known CAD. Continue Atorvastatin 80mg  daily.   4. COPD - CXR on admission showed mild chronic bronchitic changes with no acute cardiopulmonary abnormalities. Followed by PCP.   5. GERD - He reports having infrequent "heartburn" at home and uses TUMS PRN. May benefit from PPI therapy if episodes become more frequent.  For questions or updates, please contact Kyle Please consult [email or web address] for contact info under Cardiology/STEMI.   Arna Medici , PA-C 8:48 AM 04/27/2018 Pager: [phone]  Attending note Patient seen and discussed with PA Ahmed Prima, I agree with her documentation. History of CAD with prior stent. Presented with primarily atypical chest pain, however first episode since his heart attack in 2017 and symptoms had some improvement with NG. Other characteristics more inline with GI etiology, as had significant belching and could last for extended periods of time, started shortly after eating a chicken taco. No objevtive evidence of ischemia by EKG or enzymes or EKG. Echo shows normal LVEF with no WMAs. No pain this morning. No further cardiac workup planned at this time. Agree with ambulation of NG patch, if does well then d/c home with outpatient cardiology f/u   Carlyle Dolly MD

## 2018-05-12 DIAGNOSIS — E1159 Type 2 diabetes mellitus with other circulatory complications: Secondary | ICD-10-CM | POA: Diagnosis not present

## 2018-05-12 DIAGNOSIS — I10 Essential (primary) hypertension: Secondary | ICD-10-CM | POA: Diagnosis not present

## 2018-05-17 DIAGNOSIS — E1169 Type 2 diabetes mellitus with other specified complication: Secondary | ICD-10-CM | POA: Diagnosis not present

## 2018-05-17 DIAGNOSIS — E785 Hyperlipidemia, unspecified: Secondary | ICD-10-CM | POA: Diagnosis not present

## 2018-05-17 DIAGNOSIS — Z23 Encounter for immunization: Secondary | ICD-10-CM | POA: Diagnosis not present

## 2018-05-17 DIAGNOSIS — J449 Chronic obstructive pulmonary disease, unspecified: Secondary | ICD-10-CM | POA: Diagnosis not present

## 2018-05-17 DIAGNOSIS — I10 Essential (primary) hypertension: Secondary | ICD-10-CM | POA: Diagnosis not present

## 2018-05-17 DIAGNOSIS — E1159 Type 2 diabetes mellitus with other circulatory complications: Secondary | ICD-10-CM | POA: Diagnosis not present

## 2018-06-02 ENCOUNTER — Encounter: Payer: Self-pay | Admitting: Student

## 2018-06-02 ENCOUNTER — Ambulatory Visit: Payer: Medicare HMO | Admitting: Student

## 2018-06-02 VITALS — BP 134/68 | HR 87 | Ht 71.0 in | Wt 181.0 lb

## 2018-06-02 DIAGNOSIS — E785 Hyperlipidemia, unspecified: Secondary | ICD-10-CM

## 2018-06-02 DIAGNOSIS — I2510 Atherosclerotic heart disease of native coronary artery without angina pectoris: Secondary | ICD-10-CM | POA: Diagnosis not present

## 2018-06-02 DIAGNOSIS — I10 Essential (primary) hypertension: Secondary | ICD-10-CM | POA: Diagnosis not present

## 2018-06-02 DIAGNOSIS — Z720 Tobacco use: Secondary | ICD-10-CM

## 2018-06-02 NOTE — Patient Instructions (Signed)
Medication Instructions:  Your physician recommends that you continue on your current medications as directed. Please refer to the Current Medication list given to you today.  If you need a refill on your cardiac medications before your next appointment, please call your pharmacy.   Lab work: NONE If you have labs (blood work) drawn today and your tests are completely normal, you will receive your results only by: Marland Kitchen MyChart Message (if you have MyChart) OR . A paper copy in the mail If you have any lab test that is abnormal or we need to change your treatment, we will call you to review the results.  Testing/Procedures: NONE  Follow-Up: At [facility], you and your health needs are our priority.  As part of our continuing mission to provide you with exceptional heart care, we have created designated Provider Care Teams.  These Care Teams include your primary Cardiologist (physician) and Advanced Practice Providers (APPs -  Physician Assistants and Nurse Practitioners) who all work together to provide you with the care you need, when you need it. You will need a follow up appointment in 6 months.  Please call our office 2 months in advance to schedule this appointment.  You may see Jenkins Rouge, MD or one of the following Advanced Practice Providers on your designated Care Team:   Bernerd Pho, PA-C [facility]) . Ermalinda Barrios, PA-C ([place])  Any Other Special Instructions Will Be Listed Below (If Applicable). NONE

## 2018-06-02 NOTE — Progress Notes (Addendum)
Cardiology Office Note    Date:  06/02/2018   ID:  Charles Castro, DOB Mar 12, 1946, MRN [id number]  PCP:  Dione Housekeeper, MD  Cardiologist: Jenkins Rouge, MD    Chief Complaint  Patient presents with  . Hospitalization Follow-up    History of Present Illness:    Charles Castro is a 72 y.o. male with past medical history of CAD (s/p STEMI in 05/2016 with DES to mid-RCA), HTN, HLD, GERD, Type 2 DM, and tobacco use who presents to the office today for hospital follow-up.  He was recently admitted to [facility] from 04/26/2018 to 04/27/2018 for evaluation of chest discomfort. His pain was overall felt to be atypical for a cardiac etiology his symptoms started after he consumed a taco but did last for over 24 hours and improved with sublingual nitroglycerin. He reported that symptoms did not resemble his prior angina. Cyclic troponin values remained negative and his EKG showed no acute ischemic changes. An echocardiogram was performed and showed a preserved EF of 60% with no regional wall motion abnormalities. No changes were made to his medication regimen and he was discharged home.  In talking with the patient today, he reports overall doing well from a cardiac perspective since his recent hospitalization. Has been able to perform daily chores including yard work without any anginal symptoms. He denies any recurrent chest discomfort. No recent dyspnea on exertion, orthopnea, PND, palpitations or lower extremity edema.  He reports good compliance with his medications including ASA and Brilinta. Denies any evidence of active bleeding. Does report that his co-pay for Brilinta is over $200 for a 60-month supply.  Past Medical History:  Diagnosis Date  . Anemia   . Colon polyp   . COPD (chronic obstructive pulmonary disease) ([place])   . Diverticulosis   . DM type 2 with diabetic dyslipidemia ([place])   . HTN (hypertension)   . MI (myocardial infarction) ([place]) 05/28/2016   DES to RCA  . Nicotine  dependence   . STEMI (ST elevation myocardial infarction) ([place])   . Type 2 diabetes mellitus ([place])     Past Surgical History:  Procedure Laterality Date  . CARDIAC CATHETERIZATION N/A 05/28/2016   Procedure: Left Heart Cath and Coronary Angiography;  Surgeon: Nelva Bush, MD;  Location: [place] [facility];  Service: Cardiovascular;  Laterality: N/A;  . CARDIAC CATHETERIZATION N/A 05/28/2016   Procedure: Coronary Stent Intervention;  Surgeon: Nelva Bush, MD;  Location: [place] [facility];  Service: Cardiovascular;  Laterality: N/A;    Current Medications: Outpatient Medications Prior to Visit  Medication Sig Dispense Refill  . aspirin EC 81 MG EC tablet Take 1 tablet (81 mg total) by mouth daily. 30 tablet 11  . atorvastatin (LIPITOR) 80 MG tablet Take 1 tablet (80 mg total) by mouth daily at 6 PM. (Patient taking differently: Take 80 mg by mouth daily. ) 30 tablet 11  . BRILINTA 90 MG TABS tablet TAKE 1 TABLET BY MOUTH TWICE DAILY 180 tablet 0  . budesonide-formoterol (SYMBICORT) 160-4.5 MCG/ACT inhaler Inhale 2 puffs into the lungs daily as needed.     . cholecalciferol (VITAMIN D) 1000 units tablet Take by mouth.    Marland Kitchen glipiZIDE (GLUCOTROL XL) 10 MG 24 hr tablet Take 10 mg by mouth daily.     Marland Kitchen lisinopril (PRINIVIL,ZESTRIL) 2.5 MG tablet Take 2.5 mg by mouth daily.    . metFORMIN (GLUCOPHAGE-XR) 500 MG 24 hr tablet Take 500-1,000 mg by mouth See admin instructions. Take 1  tablet (500 mg) with breakfast and 2 tablets (1000 mg) with supper    . nitroGLYCERIN (NITROSTAT) 0.4 MG SL tablet Place 1 tablet (0.4 mg total) under the tongue every 5 (five) minutes x 3 doses as needed for chest pain. 25 tablet 3  . Omega-3 Fatty Acids (FISH OIL) 1000 MG CAPS Take 1,000 mg by mouth 2 (two) times daily.     No facility-administered medications prior to visit.      Allergies:   Patient has no known allergies.   Social History   Socioeconomic History  . Marital status: Widowed    Spouse  name: Not on file  . Number of children: Not on file  . Years of education: Not on file  . Highest education level: Not on file  Occupational History  . Not on file  Social Needs  . Financial resource strain: Patient refused  . Food insecurity:    Worry: Patient refused    Inability: Patient refused  . Transportation needs:    Medical: Patient refused    Non-medical: Patient refused  Tobacco Use  . Smoking status: Current Every Day Smoker    Packs/day: 0.50    Years: 30.00    Pack years: 15.00    Types: Cigarettes    Start date: 05/15/1968  . Smokeless tobacco: Never Used  Substance and Sexual Activity  . Alcohol use: Yes    Comment: 5 drinks per year  . Drug use: No  . Sexual activity: Not Currently  Lifestyle  . Physical activity:    Days per week: Patient refused    Minutes per session: Patient refused  . Stress: Patient refused  Relationships  . Social connections:    Talks on phone: Patient refused    Gets together: Patient refused    Attends religious service: Patient refused    Active member of club or organization: Patient refused    Attends meetings of clubs or organizations: Patient refused    Relationship status: Patient refused  Other Topics Concern  . Not on file  Social History Narrative  . Not on file     Family History:  The patient's family history includes CAD in his brother and father; Colon cancer in his mother; Diabetes in his brother, father, and sister.   Review of Systems:   Please see the history of present illness.     General:  No chills, fever, night sweats or weight changes.  Cardiovascular:  No chest pain, dyspnea on exertion, edema, orthopnea, palpitations, paroxysmal nocturnal dyspnea. Positive for chest pain (now resolved).  Dermatological: No rash, lesions/masses Respiratory: No cough, dyspnea Urologic: No hematuria, dysuria Abdominal:   No nausea, vomiting, diarrhea, bright red blood per rectum, melena, or  hematemesis Neurologic:  No visual changes, wkns, changes in mental status. All other systems reviewed and are otherwise negative except as noted above.   Physical Exam:    VS:  BP 134/68   Pulse 87   Ht 5\' 11"  (1.803 m)   Wt 181 lb (82.1 kg)   SpO2 96%   BMI 25.24 kg/m    General: Well developed, well nourished Caucasian male appearing in no acute distress. Head: Normocephalic, atraumatic, sclera non-icteric, no xanthomas, nares are without discharge.  Neck: No carotid bruits. JVD not elevated.  Lungs: Respirations regular and unlabored, without wheezes or rales.  Heart: Regular rate and rhythm. No S3 or S4.  No murmur, no rubs, or gallops appreciated. Abdomen: Soft, non-tender, non-distended with normoactive bowel sounds. No hepatomegaly.  No rebound/guarding. No obvious abdominal masses. Msk:  Strength and tone appear normal for age. No joint deformities or effusions. Extremities: No clubbing or cyanosis. No lower extremity edema.  Distal pedal pulses are 2+ bilaterally. Neuro: Alert and oriented X 3. Moves all extremities spontaneously. No focal deficits noted. Psych:  Responds to questions appropriately with a normal affect. Skin: No rashes or lesions noted  Wt Readings from Last 3 Encounters:  06/02/18 181 lb (82.1 kg)  04/26/18 186 lb 15.2 oz (84.8 kg)  10/13/17 179 lb (81.2 kg)     Studies/Labs Reviewed:   EKG:  EKG is not ordered today.   Recent Labs: 04/26/2018: ALT 13; BUN 13; Creatinine, Ser 0.77; Hemoglobin 10.8; Platelets 267; Potassium 4.5; Sodium 136   Lipid Panel    Component Value Date/Time   CHOL 57 06/08/2016 0827   TRIG 56 06/08/2016 0827   HDL 28 (L) 06/08/2016 0827   CHOLHDL 2.0 06/08/2016 0827   VLDL 11 06/08/2016 0827   LDLCALC 18 06/08/2016 0827    Additional studies/ records that were reviewed today include:   Cardiac Catheterization: 05/2016 Conclusions: 1. Significant one vessel coronary artery disease with 60% proximal and 100% mid RCA  stenoses. 2. Mild to moderate, nonobstructive CAD involving the LMCA, LAD, LCx, and distal RCA. 3. Normal left ventricular filling pressure. 4. Successful PCI to proximal and mid RCA with placement of a Promus Premier 3.0 x 38 mm drug-eluting stent (post dilated to 3.5 mm) with 0% residual stenosis and TIMI-3 flow.  Recommendations: 1. Dual antiplatelet therapy with aspirin and ticagrelor for at least 12 months. 2. Aggressive secondary prevention. 3. Obtain transthoracic echocardiogram in AM for evaluation of LV function. 4. Remove right femoral vein sheath when ACT < 150 seconds.    Echocardiogram: 04/26/2018 Study Conclusions  - Left ventricle: The cavity size was normal. Wall thickness was   increased in a pattern of mild LVH. Systolic function was normal.   The estimated ejection fraction was 60%. Wall motion was normal;   there were no regional wall motion abnormalities. Left   ventricular diastolic function parameters were normal. - Aortic valve: Trileaflet; mildly thickened leaflets.  Assessment:    1. Coronary artery disease involving native coronary artery of native heart without angina pectoris   2. Essential hypertension   3. Hyperlipidemia LDL goal <70   4. Tobacco use      Plan:   In order of problems listed above:  1. CAD - s/p STEMI in 05/2016 with DES to mid-RCA. Recently admitted for chest pain felt to be atypical for a cardiac etiology and ruled-out for ACS. Echo showed a preserved EF of 60% with no regional wall motion abnormalities.  - he denies any recurrent anginal symptoms since hospital discharge. - remains on ASA 81mg  daily and Brilinta 90mg  BID. Will message Dr. Johnsie Cancel about possibly switching to Plavix as Charles Castro is costing the patient over $200 for a 31-month supply. Not on BB therapy given COPD. Continue ACE-I and statin therapy.   Addendum: Discussed with Dr. Johnsie Cancel. Can switch from Brilinta to Plavix 75mg  daily.   2. HTN - BP initially  elevated to 160/88, improved to 134/68 on recheck. Reports this has been well-controlled in the ambulatory setting.  - continue Lisinopril 2.5mg  daily. This can be further titrated in the future if indicated.   3. HLD - followed by PCP. Goal LDL is < 70 with known CAD. Remains on Atorvastatin 80mg  daily.   4. Tobacco Use - he continues  to smoke 0.5 ppd. Cessation advised. No intention of quitting at this time.   Medication Adjustments/Labs and Tests Ordered: Current medicines are reviewed at length with the patient today.  Concerns regarding medicines are outlined above.  Medication changes, Labs and Tests ordered today are listed in the Patient Instructions below. Patient Instructions  Medication Instructions:  Your physician recommends that you continue on your current medications as directed. Please refer to the Current Medication list given to you today.  If you need a refill on your cardiac medications before your next appointment, please call your pharmacy.   Lab work: NONE If you have labs (blood work) drawn today and your tests are completely normal, you will receive your results only by: Marland Kitchen MyChart Message (if you have MyChart) OR . A paper copy in the mail If you have any lab test that is abnormal or we need to change your treatment, we will call you to review the results.  Testing/Procedures: NONE  Follow-Up: At [facility], you and your health needs are our priority.  As part of our continuing mission to provide you with exceptional heart care, we have created designated Provider Care Teams.  These Care Teams include your primary Cardiologist (physician) and Advanced Practice Providers (APPs -  Physician Assistants and Nurse Practitioners) who all work together to provide you with the care you need, when you need it. You will need a follow up appointment in 6 months.  Please call our office 2 months in advance to schedule this appointment.  You may see Jenkins Rouge, MD or  one of the following Advanced Practice Providers on your designated Care Team:   Bernerd Pho, PA-C [facility]) . Ermalinda Barrios, PA-C ([place])  Any Other Special Instructions Will Be Listed Below (If Applicable). NONE  Signed, Erma Heritage, PA-C  06/02/2018 7:28 PM    [facility] [address] [place], [place] [address] Phone: [phone]

## 2018-06-03 ENCOUNTER — Telehealth: Payer: Self-pay | Admitting: *Deleted

## 2018-06-03 MED ORDER — CLOPIDOGREL BISULFATE 75 MG PO TABS: 75.0000 mg | ORAL_TABLET | Freq: Every day | ORAL | 3 refills | Status: DC

## 2018-06-03 NOTE — Telephone Encounter (Signed)
-----  Message from Erma Heritage, [place] sent at 06/03/2018  7:16 AM EST ----- Regarding: Switch from Brilinta to Plavix Kisha,  Can you please let the patient know that I reviewed with Dr. Johnsie Cancel and he can switch from Brilinta to Plavix.  Would finish the supply of Brilinta he currently has and then can switch to Plavix 75 mg daily which should be more affordable for him.  Thank you.  Marye Round   ----- Message ----- From: Josue Hector, MD Sent: 06/02/2018   8:12 PM EST To: Erma Heritage, PA-C  Ok to switch to plavix  ----- Message ----- From: Waynetta Pean Sent: 06/02/2018   7:29 PM EST To: Josue Hector, MD  Dr. Johnsie Cancel,   I met this patient of yours today and he is s/p STEMI with DES to RCA in 05/2016 and has remained on ASA and Brilinta '90mg'$  BID since. I wanted to gather your thoughts about possibly switching from Brilinta to Plavix as he reports Kary Kos is costing him over $200 for a 31-monthsupply and with his additional medications, this is becoming quite a burden. He mentioned you wanted him to remain on Brilinta in the past over Plavix due to his continued tobacco use.   Thanks for your input.   [place]

## 2018-06-10 DIAGNOSIS — J329 Chronic sinusitis, unspecified: Secondary | ICD-10-CM | POA: Diagnosis not present

## 2018-06-10 DIAGNOSIS — I10 Essential (primary) hypertension: Secondary | ICD-10-CM | POA: Diagnosis not present

## 2018-06-10 DIAGNOSIS — E1159 Type 2 diabetes mellitus with other circulatory complications: Secondary | ICD-10-CM | POA: Diagnosis not present

## 2018-06-17 DIAGNOSIS — Z0000 Encounter for general adult medical examination without abnormal findings: Secondary | ICD-10-CM | POA: Diagnosis not present

## 2018-06-17 DIAGNOSIS — E1159 Type 2 diabetes mellitus with other circulatory complications: Secondary | ICD-10-CM | POA: Diagnosis not present

## 2018-06-17 DIAGNOSIS — I10 Essential (primary) hypertension: Secondary | ICD-10-CM | POA: Diagnosis not present

## 2018-09-01 DIAGNOSIS — I10 Essential (primary) hypertension: Secondary | ICD-10-CM | POA: Diagnosis not present

## 2018-09-01 DIAGNOSIS — E1159 Type 2 diabetes mellitus with other circulatory complications: Secondary | ICD-10-CM | POA: Diagnosis not present

## 2018-09-05 DIAGNOSIS — E1159 Type 2 diabetes mellitus with other circulatory complications: Secondary | ICD-10-CM | POA: Diagnosis not present

## 2018-09-05 DIAGNOSIS — E785 Hyperlipidemia, unspecified: Secondary | ICD-10-CM | POA: Diagnosis not present

## 2018-09-05 DIAGNOSIS — I10 Essential (primary) hypertension: Secondary | ICD-10-CM | POA: Diagnosis not present

## 2018-09-05 DIAGNOSIS — J209 Acute bronchitis, unspecified: Secondary | ICD-10-CM | POA: Diagnosis not present

## 2018-09-05 DIAGNOSIS — E1169 Type 2 diabetes mellitus with other specified complication: Secondary | ICD-10-CM | POA: Diagnosis not present

## 2018-09-05 DIAGNOSIS — J449 Chronic obstructive pulmonary disease, unspecified: Secondary | ICD-10-CM | POA: Diagnosis not present

## 2018-09-05 DIAGNOSIS — I2510 Atherosclerotic heart disease of native coronary artery without angina pectoris: Secondary | ICD-10-CM | POA: Diagnosis not present

## 2018-09-05 DIAGNOSIS — D649 Anemia, unspecified: Secondary | ICD-10-CM | POA: Diagnosis not present

## 2018-09-06 DIAGNOSIS — R69 Illness, unspecified: Secondary | ICD-10-CM | POA: Diagnosis not present

## 2018-09-08 NOTE — Progress Notes (Signed)
Cardiology Office Note    Date:  09/12/2018   ID:  Charles Castro, DOB Jan 28, 1946, MRN [id number]  PCP:  Dione Housekeeper, MD  Cardiologist: Jenkins Rouge, MD    No chief complaint on file.   History of Present Illness:    Charles Castro is a 73 y.o. male with past medical history of CAD (s/p STEMI in 05/2016 with DES to mid-RCA), HTN, HLD, GERD, Type 2 DM, and tobacco use who presents to the office today for follow-up.  Admitted to [facility] from 04/26/2018 to 04/27/2018 for evaluation of chest discomfort. His pain was overall felt to be atypical for a cardiac etiology his symptoms started after he consumed a taco  Cyclic troponin values remained negative and his EKG showed no acute ischemic changes. An echocardiogram was performed and showed a preserved EF of 60% with no regional wall motion abnormalities. No changes were made to his medication regimen and he was discharged home.  No angina on plavix now as Brilinta was too expensive    Has had cough with congestion Finishing Z pack   Past Medical History:  Diagnosis Date  . Anemia   . Colon polyp   . COPD (chronic obstructive pulmonary disease) ([place])   . Diverticulosis   . DM type 2 with diabetic dyslipidemia ([place])   . HTN (hypertension)   . MI (myocardial infarction) ([place]) 05/28/2016   DES to RCA  . Nicotine dependence   . STEMI (ST elevation myocardial infarction) ([place])   . Type 2 diabetes mellitus ([place])     Past Surgical History:  Procedure Laterality Date  . CARDIAC CATHETERIZATION N/A 05/28/2016   Procedure: Left Heart Cath and Coronary Angiography;  Surgeon: Nelva Bush, MD;  Location: [facility];  Service: Cardiovascular;  Laterality: N/A;  . CARDIAC CATHETERIZATION N/A 05/28/2016   Procedure: Coronary Stent Intervention;  Surgeon: Nelva Bush, MD;  Location: [facility];  Service: Cardiovascular;  Laterality: N/A;    Current Medications: Outpatient Medications Prior to Visit  Medication Sig  Dispense Refill  . aspirin EC 81 MG EC tablet Take 1 tablet (81 mg total) by mouth daily. 30 tablet 11  . atorvastatin (LIPITOR) 80 MG tablet Take 1 tablet (80 mg total) by mouth daily at 6 PM. (Patient taking differently: Take 80 mg by mouth daily. ) 30 tablet 11  . budesonide-formoterol (SYMBICORT) 160-4.5 MCG/ACT inhaler Inhale 2 puffs into the lungs daily as needed.     . cholecalciferol (VITAMIN D) 1000 units tablet Take by mouth.    . clopidogrel (PLAVIX) 75 MG tablet Take 1 tablet (75 mg total) by mouth daily. 90 tablet 3  . glipiZIDE (GLUCOTROL XL) 10 MG 24 hr tablet Take 10 mg by mouth daily.     Marland Kitchen lisinopril (PRINIVIL,ZESTRIL) 2.5 MG tablet Take 2.5 mg by mouth daily.    . metFORMIN (GLUCOPHAGE-XR) 500 MG 24 hr tablet Take 500-1,000 mg by mouth See admin instructions. Take 1 tablet (500 mg) with breakfast and 2 tablets (1000 mg) with supper    . nitroGLYCERIN (NITROSTAT) 0.4 MG SL tablet Place 1 tablet (0.4 mg total) under the tongue every 5 (five) minutes x 3 doses as needed for chest pain. 25 tablet 3  . Omega-3 Fatty Acids (FISH OIL) 1000 MG CAPS Take 1,000 mg by mouth 2 (two) times daily.     No facility-administered medications prior to visit.      Allergies:   Patient has no known allergies.  Social History   Socioeconomic History  . Marital status: Widowed    Spouse name: Not on file  . Number of children: Not on file  . Years of education: Not on file  . Highest education level: Not on file  Occupational History  . Not on file  Social Needs  . Financial resource strain: Patient refused  . Food insecurity:    Worry: Patient refused    Inability: Patient refused  . Transportation needs:    Medical: Patient refused    Non-medical: Patient refused  Tobacco Use  . Smoking status: Current Every Day Smoker    Packs/day: 0.50    Years: 30.00    Pack years: 15.00    Types: Cigarettes    Start date: 05/15/1968  . Smokeless tobacco: Never Used  Substance and Sexual  Activity  . Alcohol use: Yes    Comment: 5 drinks per year  . Drug use: No  . Sexual activity: Not Currently  Lifestyle  . Physical activity:    Days per week: Patient refused    Minutes per session: Patient refused  . Stress: Patient refused  Relationships  . Social connections:    Talks on phone: Patient refused    Gets together: Patient refused    Attends religious service: Patient refused    Active member of club or organization: Patient refused    Attends meetings of clubs or organizations: Patient refused    Relationship status: Patient refused  Other Topics Concern  . Not on file  Social History Narrative  . Not on file     Family History:  The patient's family history includes CAD in his brother and father; Colon cancer in his mother; Diabetes in his brother, father, and sister.   Review of Systems:   Please see the history of present illness.     General:  No chills, fever, night sweats or weight changes.  Cardiovascular:  No chest pain, dyspnea on exertion, edema, orthopnea, palpitations, paroxysmal nocturnal dyspnea. Positive for chest pain (now resolved).  Dermatological: No rash, lesions/masses Respiratory: No cough, dyspnea Urologic: No hematuria, dysuria Abdominal:   No nausea, vomiting, diarrhea, bright red blood per rectum, melena, or hematemesis Neurologic:  No visual changes, wkns, changes in mental status. All other systems reviewed and are otherwise negative except as noted above.   Physical Exam:    VS:  BP 116/70   Pulse 81   Ht 5\' 11"  (1.803 m)   Wt 81.6 kg   SpO2 95%   BMI 25.10 kg/m    Affect appropriate Healthy:  appears stated age 78: normal Neck supple with no adenopathy JVP normal no bruits no thyromegaly Lungs bilateral exp  wheezing and good diaphragmatic motion Heart:  S1/S2 no murmur, no rub, gallop or click PMI normal Abdomen: benighn, BS positve, no tenderness, no AAA no bruit.  No HSM or HJR Distal pulses intact with no  bruits No edema Neuro non-focal Skin warm and dry No muscular weakness   Wt Readings from Last 3 Encounters:  09/12/18 81.6 kg  06/02/18 82.1 kg  04/26/18 84.8 kg     Studies/Labs Reviewed:   EKG:  SR RBBB no acute changes 09/12/18  Recent Labs: 04/26/2018: ALT 13; BUN 13; Creatinine, Ser 0.77; Hemoglobin 10.8; Platelets 267; Potassium 4.5; Sodium 136   Lipid Panel    Component Value Date/Time   CHOL 57 06/08/2016 0827   TRIG 56 06/08/2016 0827   HDL 28 (L) 06/08/2016 0827   CHOLHDL 2.0  06/08/2016 0827   VLDL 11 06/08/2016 0827   LDLCALC 18 06/08/2016 0827    Additional studies/ records that were reviewed today include:   Cardiac Catheterization: 05/2016 Conclusions: 1. Significant one vessel coronary artery disease with 60% proximal and 100% mid RCA stenoses. 2. Mild to moderate, nonobstructive CAD involving the LMCA, LAD, LCx, and distal RCA. 3. Normal left ventricular filling pressure. 4. Successful PCI to proximal and mid RCA with placement of a Promus Premier 3.0 x 38 mm drug-eluting stent (post dilated to 3.5 mm) with 0% residual stenosis and TIMI-3 flow.  Recommendations: 1. Dual antiplatelet therapy with aspirin and ticagrelor for at least 12 months. 2. Aggressive secondary prevention. 3. Obtain transthoracic echocardiogram in AM for evaluation of LV function. 4. Remove right femoral vein sheath when ACT < 150 seconds.    Echocardiogram: 04/26/2018 Study Conclusions  - Left ventricle: The cavity size was normal. Wall thickness was   increased in a pattern of mild LVH. Systolic function was normal.   The estimated ejection fraction was 60%. Wall motion was normal;   there were no regional wall motion abnormalities. Left   ventricular diastolic function parameters were normal. - Aortic valve: Trileaflet; mildly thickened leaflets.  Assessment:    1. Coronary artery disease involving native coronary artery of native heart without angina pectoris   2.  Bronchitis      Plan:   In order of problems listed above:  1. CAD - s/p STEMI in 05/2016 with DES to mid-RCA. Recently admitted for chest pain felt to be atypical for a cardiac etiology and ruled-out for ACS. Echo showed a preserved EF of 60% with no regional wall motion abnormalities. On plavix now due to cost of Brillinta   2. HTN - Well controlled.  Continue current medications and low sodium Dash type diet.     3. HLD - followed by PCP. Goal LDL is < 70 with known CAD. Remains on Atorvastatin 80mg  daily.   4. Tobacco Use - he continues to smoke 0.5 ppd. Cessation advised. No intention of quitting at this time. Should Discuss lung cancer screening CT with primary   5. Bronchitis:  CXR ordered as he has abnormal lung exam and finishing antibiotics today F/U Dr Retta Mac

## 2018-09-12 ENCOUNTER — Ambulatory Visit: Payer: Medicare HMO | Admitting: Cardiovascular Disease

## 2018-09-12 ENCOUNTER — Encounter: Payer: Self-pay | Admitting: Cardiovascular Disease

## 2018-09-12 ENCOUNTER — Ambulatory Visit (HOSPITAL_COMMUNITY)
Admission: RE | Admit: 2018-09-12 | Discharge: 2018-09-12 | Disposition: A | Discharge status: Home / Self Care | Payer: Medicare HMO | Source: Ambulatory Visit | Attending: Cardiovascular Disease | Admitting: Cardiovascular Disease

## 2018-09-12 VITALS — BP 116/70 | HR 81 | Ht 71.0 in | Wt 180.0 lb

## 2018-09-12 DIAGNOSIS — J40 Bronchitis, not specified as acute or chronic: Secondary | ICD-10-CM

## 2018-09-12 DIAGNOSIS — I2510 Atherosclerotic heart disease of native coronary artery without angina pectoris: Secondary | ICD-10-CM

## 2018-09-12 DIAGNOSIS — R05 Cough: Secondary | ICD-10-CM | POA: Diagnosis not present

## 2018-09-12 NOTE — Patient Instructions (Signed)
Medication Instructions:  Your physician recommends that you continue on your current medications as directed. Please refer to the Current Medication list given to you today.  If you need a refill on your cardiac medications before your next appointment, please call your pharmacy.   Lab work: NONE   If you have labs (blood work) drawn today and your tests are completely normal, you will receive your results only by: Marland Kitchen MyChart Message (if you have MyChart) OR . A paper copy in the mail If you have any lab test that is abnormal or we need to change your treatment, we will call you to review the results.  Testing/Procedures: A chest x-ray takes a picture of the organs and structures inside the chest, including the heart, lungs, and blood vessels. This test can show several things, including, whether the heart is enlarges; whether fluid is building up in the lungs; and whether pacemaker / defibrillator leads are still in place.  Follow-Up: At [facility], you and your health needs are our priority.  As part of our continuing mission to provide you with exceptional heart care, we have created designated Provider Care Teams.  These Care Teams include your primary Cardiologist (physician) and Advanced Practice Providers (APPs -  Physician Assistants and Nurse Practitioners) who all work together to provide you with the care you need, when you need it. You will need a follow up appointment in 1 years.  Please call our office 2 months in advance to schedule this appointment.  You may see Jenkins Rouge, MD or one of the following Advanced Practice Providers on your designated Care Team:   Bernerd Pho, PA-C [facility]) . Ermalinda Barrios, PA-C ([facility])  Any Other Special Instructions Will Be Listed Below (If Applicable). Thank you for choosing [facility]!

## 2018-09-20 DIAGNOSIS — I10 Essential (primary) hypertension: Secondary | ICD-10-CM | POA: Diagnosis not present

## 2018-09-20 DIAGNOSIS — J441 Chronic obstructive pulmonary disease with (acute) exacerbation: Secondary | ICD-10-CM | POA: Diagnosis not present

## 2018-09-20 DIAGNOSIS — R69 Illness, unspecified: Secondary | ICD-10-CM | POA: Diagnosis not present

## 2018-09-20 DIAGNOSIS — E1159 Type 2 diabetes mellitus with other circulatory complications: Secondary | ICD-10-CM | POA: Diagnosis not present

## 2018-11-06 DIAGNOSIS — R69 Illness, unspecified: Secondary | ICD-10-CM | POA: Diagnosis not present

## 2018-12-05 DIAGNOSIS — E1169 Type 2 diabetes mellitus with other specified complication: Secondary | ICD-10-CM | POA: Diagnosis not present

## 2018-12-05 DIAGNOSIS — D649 Anemia, unspecified: Secondary | ICD-10-CM | POA: Diagnosis not present

## 2018-12-05 DIAGNOSIS — E785 Hyperlipidemia, unspecified: Secondary | ICD-10-CM | POA: Diagnosis not present

## 2018-12-05 DIAGNOSIS — I25810 Atherosclerosis of coronary artery bypass graft(s) without angina pectoris: Secondary | ICD-10-CM | POA: Diagnosis not present

## 2018-12-05 DIAGNOSIS — E1159 Type 2 diabetes mellitus with other circulatory complications: Secondary | ICD-10-CM | POA: Diagnosis not present

## 2018-12-05 DIAGNOSIS — I10 Essential (primary) hypertension: Secondary | ICD-10-CM | POA: Diagnosis not present

## 2018-12-08 DIAGNOSIS — D649 Anemia, unspecified: Secondary | ICD-10-CM | POA: Diagnosis not present

## 2018-12-15 DIAGNOSIS — D649 Anemia, unspecified: Secondary | ICD-10-CM | POA: Diagnosis not present

## 2019-01-14 IMAGING — DX DG CHEST 2V
2 series · 2 of 2 positions shown · non-contrast
Comparison: 11/16/2013

CLINICAL DATA: Diabetes.  Vascular disease.  Hypertension.

EXAM:
CHEST  2 VIEW

[chest pa]
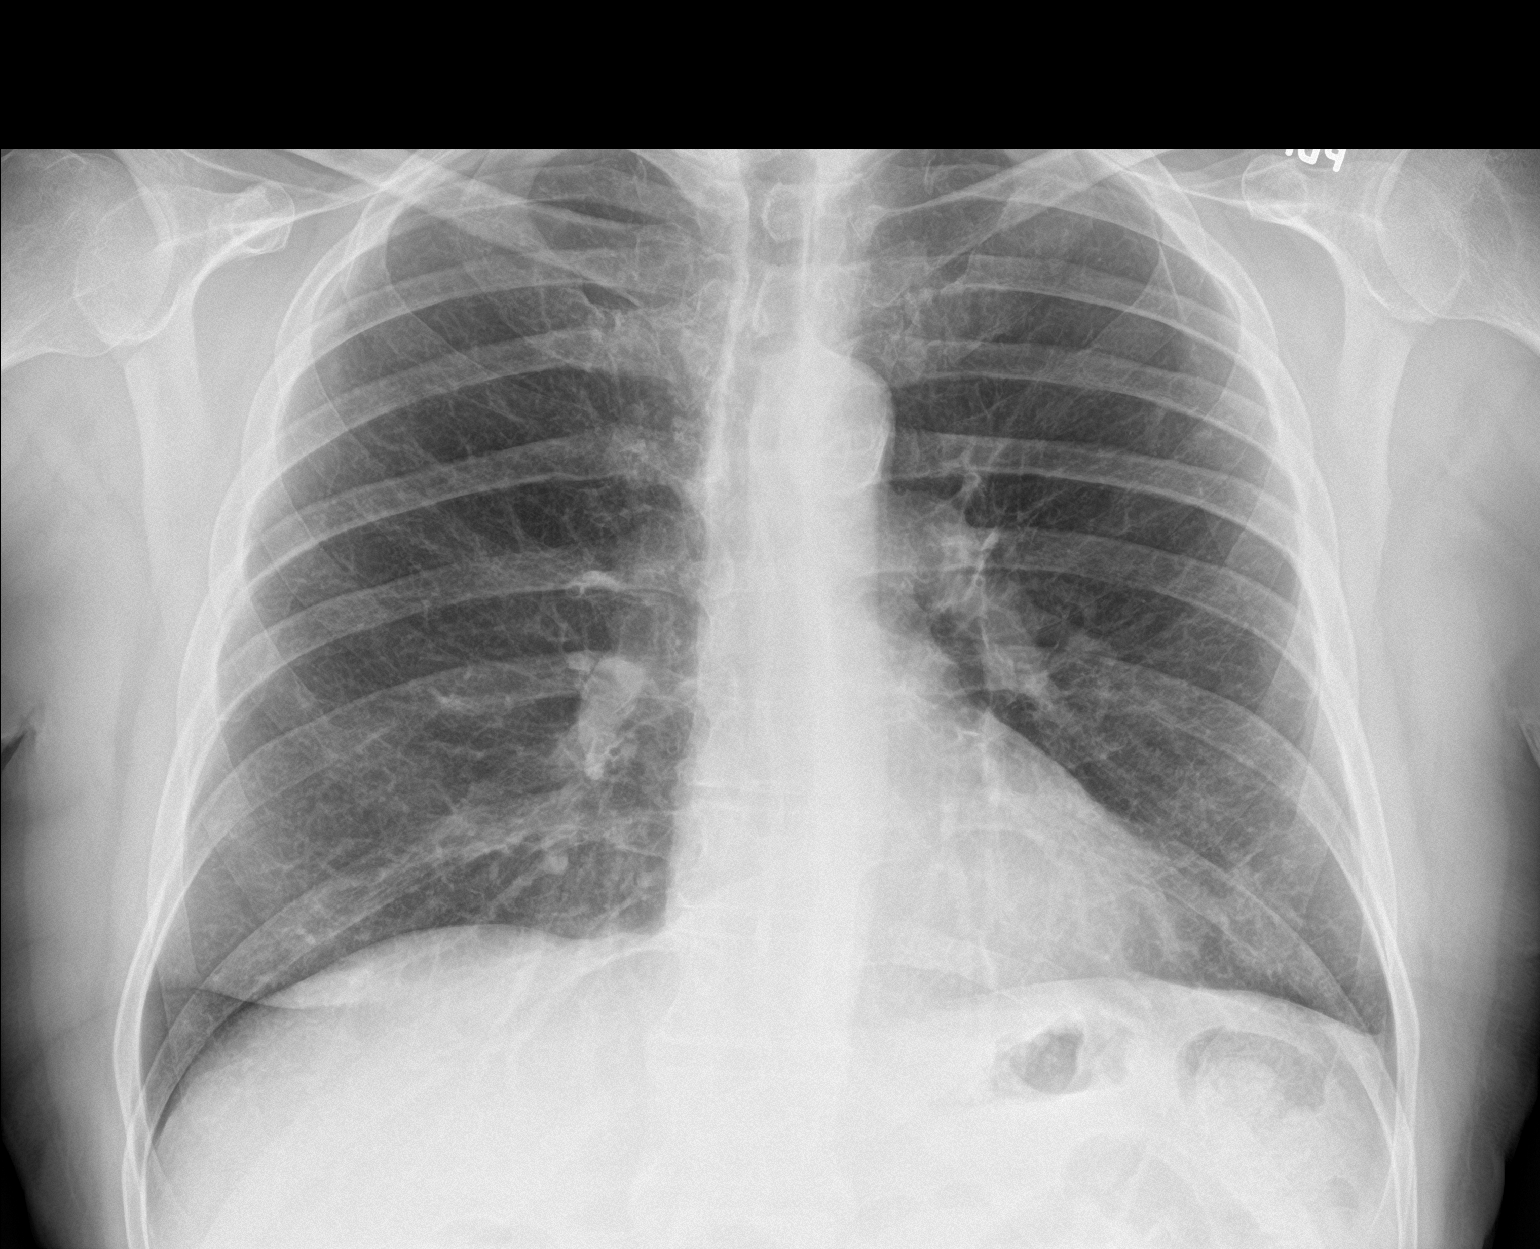

[chest lat]
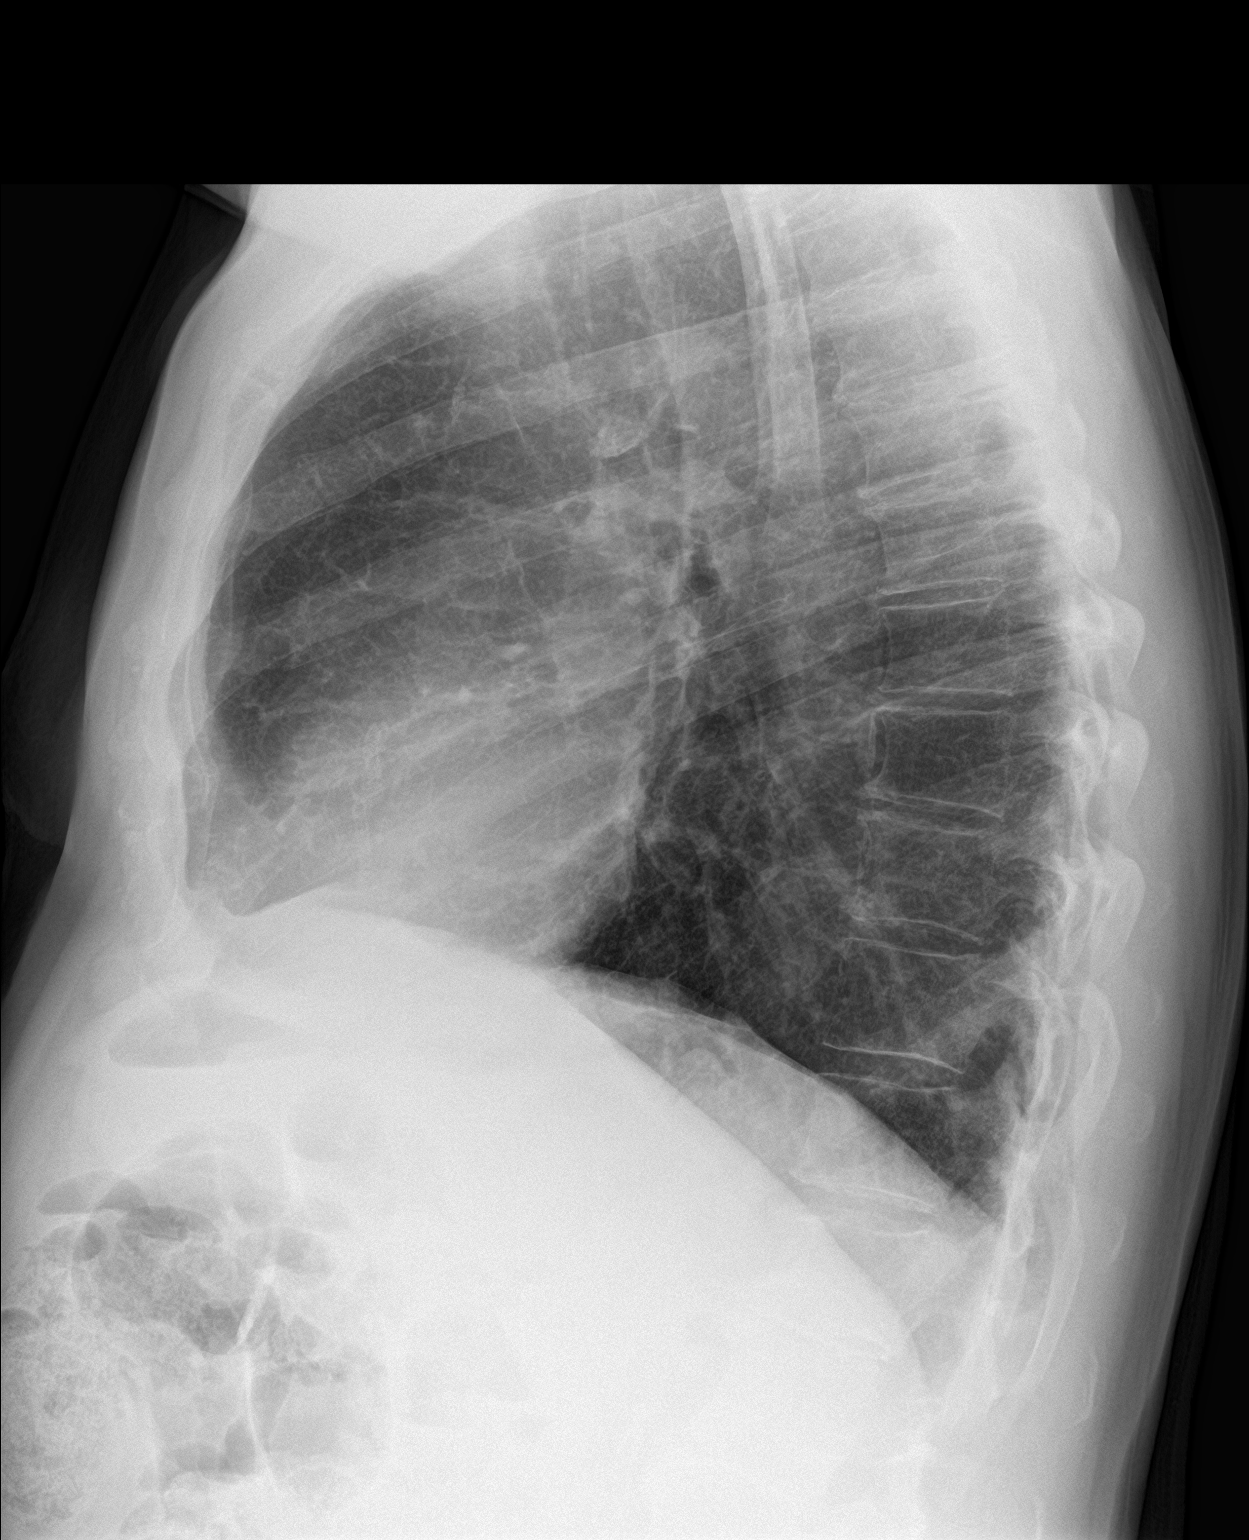

[2 of 2 positions shown; findings below may reference images not displayed]

FINDINGS: Heart size is normal. Chronic aortic atherosclerosis. Chronic lung
disease with interstitial scarring and probable upper lobe
emphysema. No infiltrate, collapse or effusion. No sign of mass.
Ordinary degenerative changes affect spine.
IMPRESSION: No acute or active finding. Aortic atherosclerosis. Chronic lung
disease.

## 2019-02-21 ENCOUNTER — Other Ambulatory Visit: Payer: Self-pay

## 2019-02-24 ENCOUNTER — Other Ambulatory Visit: Payer: Self-pay

## 2019-02-24 ENCOUNTER — Encounter: Payer: Self-pay | Admitting: Physician Assistant

## 2019-02-24 ENCOUNTER — Ambulatory Visit (INDEPENDENT_AMBULATORY_CARE_PROVIDER_SITE_OTHER): Payer: Medicare HMO | Admitting: Physician Assistant

## 2019-02-24 VITALS — BP 144/89 | HR 100 | Temp 97.5°F | Ht 71.0 in | Wt 172.6 lb

## 2019-02-24 DIAGNOSIS — E785 Hyperlipidemia, unspecified: Secondary | ICD-10-CM

## 2019-02-24 DIAGNOSIS — I2510 Atherosclerotic heart disease of native coronary artery without angina pectoris: Secondary | ICD-10-CM | POA: Diagnosis not present

## 2019-02-24 DIAGNOSIS — J449 Chronic obstructive pulmonary disease, unspecified: Secondary | ICD-10-CM | POA: Diagnosis not present

## 2019-02-24 DIAGNOSIS — I10 Essential (primary) hypertension: Secondary | ICD-10-CM

## 2019-02-24 DIAGNOSIS — E1169 Type 2 diabetes mellitus with other specified complication: Secondary | ICD-10-CM

## 2019-02-24 LAB — BAYER DCA HB A1C WAIVED: HB A1C (BAYER DCA - WAIVED): 6.1 % (ref <7.0)

## 2019-02-24 MED ORDER — BUDESONIDE-FORMOTEROL FUMARATE 160-4.5 MCG/ACT IN AERO: 2.0000 | INHALATION_SPRAY | Freq: Every day | RESPIRATORY_TRACT | 5 refills | Status: DC | PRN

## 2019-02-24 MED ORDER — METFORMIN HCL ER 500 MG PO TB24: 500.0000 mg | ORAL_TABLET | ORAL | 5 refills | Status: DC

## 2019-02-24 MED ORDER — PANTOPRAZOLE SODIUM 20 MG PO TBEC: 20.0000 mg | DELAYED_RELEASE_TABLET | Freq: Every day | ORAL | 1 refills | Status: DC

## 2019-02-24 MED ORDER — LISINOPRIL 2.5 MG PO TABS: 2.5000 mg | ORAL_TABLET | Freq: Every day | ORAL | 1 refills | Status: DC

## 2019-02-24 MED ORDER — ATORVASTATIN CALCIUM 80 MG PO TABS: 80.0000 mg | ORAL_TABLET | Freq: Every day | ORAL | 1 refills | Status: DC

## 2019-02-24 MED ORDER — CLOPIDOGREL BISULFATE 75 MG PO TABS: 75.0000 mg | ORAL_TABLET | Freq: Every day | ORAL | 1 refills | Status: DC

## 2019-02-24 MED ORDER — GLIPIZIDE ER 5 MG PO TB24: 5.0000 mg | ORAL_TABLET | Freq: Every day | ORAL | 1 refills | Status: DC

## 2019-02-24 NOTE — Progress Notes (Signed)
  Subjective:     Patient ID: Charles Castro, male   DOB: 08-10-1945, 73 y.o.   MRN: [id number]  HPI Pt here to establish care He was a long term pt of Dr Edrick Oh Pt with sig hx of NIDDM, CAD,HTN,COPD and hyperlipid Pt with prev stent placement and followed yearly by Cardiology - Dr Jenkins Rouge States he has been doing well with no concerns at this time He tries to stay as active as possible but the humid air causes some breathing issues requiring him to use his inhaler ~ 1x/week Still smoking ~ 1/2 ppd He tries to eat correctly given his NIDDM and states BS in the 150 range when checked  Review of Systems  Constitutional: Negative.   Respiratory: Negative.   Cardiovascular: Negative.   Endocrine: Negative.   Musculoskeletal: Negative.   Neurological: Negative.   Psychiatric/Behavioral: Negative.        Objective:   Physical Exam Vitals signs and nursing note reviewed.  Constitutional:      General: He is not in acute distress.    Appearance: Normal appearance. He is normal weight. He is not ill-appearing.  Neck:     Vascular: No carotid bruit.  Cardiovascular:     Rate and Rhythm: Normal rate and regular rhythm.     Pulses: Normal pulses.     Heart sounds: Normal heart sounds.  Pulmonary:     Effort: Pulmonary effort is normal.     Comments: Exp wheeze noted Musculoskeletal:     Right lower leg: No edema.     Left lower leg: No edema.     Comments: No lower ext ulceration or lesions  Lymphadenopathy:     Cervical: No cervical adenopathy.  Neurological:     General: No focal deficit present.     Mental Status: He is alert and oriented to person, place, and time.     Motor: No weakness.  Psychiatric:        Mood and Affect: Mood normal.        Behavior: Behavior normal.        Thought Content: Thought content normal.   CMP and fasting lipids pending A1C 6.1     Assessment:     1. Type 2 diabetes mellitus with other specified complication, without long-term  current use of insulin ([place])   2. Hyperlipidemia, unspecified hyperlipidemia type   3. Hypertension, unspecified type   4. Coronary artery disease involving native coronary artery of native heart without angina pectoris   5. Chronic obstructive pulmonary disease, unspecified COPD type ([place])        Plan:     Pt to continue current meds and refill done today Pt to keep regular follow up with Cardiology Regarding his diet would like him to watch his diet Exercise when possible Nightly foot checks No walking barefoot He is up to date with a vision check 2 weeks ago Recheck in 3 months sooner if any problems

## 2019-02-24 NOTE — Patient Instructions (Signed)

## 2019-02-25 LAB — CMP14+EGFR
ALT: 13 IU/L (ref 0–44)
AST: 20 IU/L (ref 0–40)
Albumin/Globulin Ratio: 1.8 (ref 1.2–2.2)
Albumin: 4.4 g/dL (ref 3.8–4.8)
Alkaline Phosphatase: 87 IU/L (ref 39–117)
BUN/Creatinine Ratio: 22 (ref 10–24)
BUN: 18 mg/dL (ref 8–27)
Bilirubin Total: 0.2 mg/dL (ref 0.0–1.2)
CO2: 19 mmol/L — ABNORMAL LOW (ref 20–29)
Calcium: 9.8 mg/dL (ref 8.6–10.2)
Chloride: 100 mmol/L (ref 96–106)
Creatinine, Ser: 0.82 mg/dL (ref 0.76–1.27)
GFR calc Af Amer: 104 mL/min/{1.73_m2} (ref >59)
GFR calc non Af Amer: 90 mL/min/{1.73_m2} (ref >59)
Globulin, Total: 2.4 g/dL (ref 1.5–4.5)
Glucose: 151 mg/dL — ABNORMAL HIGH (ref 65–99)
Potassium: 4.7 mmol/L (ref 3.5–5.2)
Sodium: 135 mmol/L (ref 134–144)
Total Protein: 6.8 g/dL (ref 6.0–8.5)

## 2019-02-25 LAB — LIPID PANEL
Chol/HDL Ratio: 3.4 ratio (ref 0.0–5.0)
Cholesterol, Total: 106 mg/dL (ref 100–199)
HDL: 31 mg/dL — ABNORMAL LOW (ref >39)
LDL Calculated: 57 mg/dL (ref 0–99)
Triglycerides: 91 mg/dL (ref 0–149)
VLDL Cholesterol Cal: 18 mg/dL (ref 5–40)

## 2019-04-05 ENCOUNTER — Ambulatory Visit: Payer: Self-pay | Admitting: Family Medicine

## 2019-05-04 DIAGNOSIS — R69 Illness, unspecified: Secondary | ICD-10-CM | POA: Diagnosis not present

## 2019-05-29 ENCOUNTER — Encounter: Payer: Self-pay | Admitting: Family Medicine

## 2019-05-29 ENCOUNTER — Other Ambulatory Visit: Payer: Self-pay

## 2019-05-29 ENCOUNTER — Ambulatory Visit (INDEPENDENT_AMBULATORY_CARE_PROVIDER_SITE_OTHER): Payer: Medicare HMO | Admitting: Family Medicine

## 2019-05-29 VITALS — BP 146/79 | HR 96 | Temp 98.0°F | Ht 71.0 in | Wt 178.6 lb

## 2019-05-29 DIAGNOSIS — I10 Essential (primary) hypertension: Secondary | ICD-10-CM

## 2019-05-29 DIAGNOSIS — Z1159 Encounter for screening for other viral diseases: Secondary | ICD-10-CM | POA: Diagnosis not present

## 2019-05-29 DIAGNOSIS — E1169 Type 2 diabetes mellitus with other specified complication: Secondary | ICD-10-CM | POA: Diagnosis not present

## 2019-05-29 DIAGNOSIS — J439 Emphysema, unspecified: Secondary | ICD-10-CM

## 2019-05-29 DIAGNOSIS — E782 Mixed hyperlipidemia: Secondary | ICD-10-CM | POA: Diagnosis not present

## 2019-05-29 DIAGNOSIS — Z716 Tobacco abuse counseling: Secondary | ICD-10-CM

## 2019-05-29 DIAGNOSIS — E1159 Type 2 diabetes mellitus with other circulatory complications: Secondary | ICD-10-CM | POA: Diagnosis not present

## 2019-05-29 LAB — BAYER DCA HB A1C WAIVED: HB A1C (BAYER DCA - WAIVED): 6.4 % (ref <7.0)

## 2019-05-29 MED ORDER — CLOPIDOGREL BISULFATE 75 MG PO TABS: 75.0000 mg | ORAL_TABLET | Freq: Every day | ORAL | 3 refills | Status: DC

## 2019-05-29 MED ORDER — METFORMIN HCL ER 500 MG PO TB24: 500.0000 mg | ORAL_TABLET | ORAL | 3 refills | Status: DC

## 2019-05-29 MED ORDER — BUPROPION HCL ER (SR) 150 MG PO TB12: 150.0000 mg | ORAL_TABLET | Freq: Two times a day (BID) | ORAL | 2 refills | Status: DC

## 2019-05-29 MED ORDER — PANTOPRAZOLE SODIUM 20 MG PO TBEC: 20.0000 mg | DELAYED_RELEASE_TABLET | Freq: Every day | ORAL | 3 refills | Status: DC

## 2019-05-29 MED ORDER — ATORVASTATIN CALCIUM 80 MG PO TABS: 80.0000 mg | ORAL_TABLET | Freq: Every day | ORAL | 3 refills | Status: DC

## 2019-05-29 MED ORDER — BUDESONIDE-FORMOTEROL FUMARATE 160-4.5 MCG/ACT IN AERO: 2.0000 | INHALATION_SPRAY | Freq: Every day | RESPIRATORY_TRACT | 5 refills | Status: DC | PRN

## 2019-05-29 MED ORDER — GLIPIZIDE ER 5 MG PO TB24: 5.0000 mg | ORAL_TABLET | Freq: Every day | ORAL | 3 refills | Status: DC

## 2019-05-29 MED ORDER — LISINOPRIL 2.5 MG PO TABS: 2.5000 mg | ORAL_TABLET | Freq: Every day | ORAL | 3 refills | Status: DC

## 2019-05-29 NOTE — Progress Notes (Signed)
BP (!) 146/79   Pulse 96   Temp 98 F (36.7 C) (Temporal)   Ht 5\' 11"  (1.803 m)   Wt 178 lb 9.6 oz (81 kg)   SpO2 95%   BMI 24.91 kg/m    Subjective:   Patient ID: Charles Castro, male    DOB: [birth year], 73 y.o.   MRN: [id number]  HPI: HALLARD MCCULLAR is a 73 y.o. male presenting on 05/29/2019 for Diabetes (3 month follow up)   HPI Type 2 diabetes mellitus Patient comes in today for recheck of his diabetes. Patient has been currently taking Metformin. Patient is currently on an ACE inhibitor/ARB. Patient has not seen an ophthalmologist this year. Patient denies any issues with their feet.   Hypertension Patient is currently on lisinopril, and their blood pressure today is 46/79. Patient denies any lightheadedness or dizziness. Patient denies headaches, blurred vision, chest pains, shortness of breath, or weakness. Denies any side effects from medication and is content with current medication.   Hyperlipidemia Patient is coming in for recheck of his hyperlipidemia. The patient is currently taking atorvastatin. They deny any issues with myalgias or history of liver damage from it. They deny any focal numbness or weakness or chest pain.   GERD Patient is currently on pantoprazole.  She denies any major symptoms or abdominal pain or belching or burping. She denies any blood in her stool or lightheadedness or dizziness.   Relevant past medical, surgical, family and social history reviewed and updated as indicated. Interim medical history since our last visit reviewed. Allergies and medications reviewed and updated.  Review of Systems  Constitutional: Negative for chills and fever.  Eyes: Negative for visual disturbance.  Respiratory: Negative for shortness of breath and wheezing.   Cardiovascular: Negative for chest pain and leg swelling.  Gastrointestinal: Negative for abdominal pain.  Musculoskeletal: Negative for back pain and gait problem.  Skin: Negative for rash.   Neurological: Negative for dizziness, weakness and numbness.  All other systems reviewed and are negative.   Per HPI unless specifically indicated above   Allergies as of 05/29/2019   No Known Allergies     Medication List       Accurate as of May 29, 2019  9:16 AM. If you have any questions, ask your nurse or doctor.        aspirin 81 MG EC tablet Take 1 tablet (81 mg total) by mouth daily.   atorvastatin 80 MG tablet Commonly known as: LIPITOR Take 1 tablet (80 mg total) by mouth daily.   budesonide-formoterol 160-4.5 MCG/ACT inhaler Commonly known as: SYMBICORT Inhale 2 puffs into the lungs daily as needed.   clopidogrel 75 MG tablet Commonly known as: PLAVIX Take 1 tablet (75 mg total) by mouth daily.   glipiZIDE 5 MG 24 hr tablet Commonly known as: GLUCOTROL XL Take 1 tablet (5 mg total) by mouth daily.   lisinopril 2.5 MG tablet Commonly known as: ZESTRIL Take 1 tablet (2.5 mg total) by mouth daily.   metFORMIN 500 MG 24 hr tablet Commonly known as: GLUCOPHAGE-XR Take 1-2 tablets (500-1,000 mg total) by mouth See admin instructions. 1 tablet with breakfast & 2 tablets with supper   nitroGLYCERIN 0.4 MG SL tablet Commonly known as: NITROSTAT Place 1 tablet (0.4 mg total) under the tongue every 5 (five) minutes x 3 doses as needed for chest pain.   pantoprazole 20 MG tablet Commonly known as: PROTONIX Take 1 tablet (20 mg total) by mouth daily.  Objective:   BP (!) 146/79   Pulse 96   Temp 98 F (36.7 C) (Temporal)   Ht 5\' 11"  (1.803 m)   Wt 178 lb 9.6 oz (81 kg)   SpO2 95%   BMI 24.91 kg/m   Wt Readings from Last 3 Encounters:  05/29/19 178 lb 9.6 oz (81 kg)  02/24/19 172 lb 9.6 oz (78.3 kg)  09/12/18 180 lb (81.6 kg)    Physical Exam Vitals signs and nursing note reviewed.  Constitutional:      General: He is not in acute distress.    Appearance: He is well-developed. He is not diaphoretic.  Eyes:     General: No scleral  icterus.    Conjunctiva/sclera: Conjunctivae normal.  Neck:     Musculoskeletal: Neck supple.     Thyroid: No thyromegaly.  Cardiovascular:     Rate and Rhythm: Normal rate and regular rhythm.     Heart sounds: Normal heart sounds. No murmur.  Pulmonary:     Effort: Pulmonary effort is normal. No respiratory distress.     Breath sounds: Normal breath sounds. No wheezing.  Musculoskeletal: Normal range of motion.  Lymphadenopathy:     Cervical: No cervical adenopathy.  Skin:    General: Skin is warm and dry.     Findings: No rash.  Neurological:     Mental Status: He is alert and oriented to person, place, and time.     Coordination: Coordination normal.  Psychiatric:        Behavior: Behavior normal.     Assessment & Plan:   Problem List Items Addressed This Visit      Cardiovascular and Mediastinum   Hypertension associated with diabetes ([place])   Relevant Medications   atorvastatin (LIPITOR) 80 MG tablet   clopidogrel (PLAVIX) 75 MG tablet   glipiZIDE (GLUCOTROL XL) 5 MG 24 hr tablet   lisinopril (ZESTRIL) 2.5 MG tablet   metFORMIN (GLUCOPHAGE-XR) 500 MG 24 hr tablet     Respiratory   COPD (chronic obstructive pulmonary disease) ([facility])   Relevant Medications   budesonide-formoterol (SYMBICORT) 160-4.5 MCG/ACT inhaler     Endocrine   DM (diabetes mellitus) ([facility]) - Primary   Relevant Medications   atorvastatin (LIPITOR) 80 MG tablet   glipiZIDE (GLUCOTROL XL) 5 MG 24 hr tablet   lisinopril (ZESTRIL) 2.5 MG tablet   metFORMIN (GLUCOPHAGE-XR) 500 MG 24 hr tablet   Other Relevant Orders   Bayer DCA Hb A1c Waived (Completed)     Other   HLD (hyperlipidemia)   Relevant Medications   atorvastatin (LIPITOR) 80 MG tablet   lisinopril (ZESTRIL) 2.5 MG tablet    Other Visit Diagnoses    Encounter for smoking cessation counseling       Relevant Medications   buPROPion (WELLBUTRIN SR) 150 MG 12 hr tablet   Need for hepatitis C screening test          Continue  current medication including metformin lisinopril and glipizide and Plavix and Lipitor.  Patient wants to try Wellbutrin for smoking cessation Follow up plan: Return in about 3 months (around 08/29/2019), or if symptoms worsen or fail to improve, for diabetes and hypertension.  Counseling provided for all of the vaccine components Orders Placed This Encounter  Procedures  . Bayer [facility] Hb A1c Waived    Caryl Pina, MD [facility] 05/29/2019, 9:16 AM

## 2019-05-31 ENCOUNTER — Encounter: Payer: Self-pay | Admitting: Family Medicine

## 2019-06-19 ENCOUNTER — Ambulatory Visit: Payer: Medicare HMO

## 2019-07-06 ENCOUNTER — Ambulatory Visit: Payer: Medicare HMO

## 2019-07-28 DIAGNOSIS — R69 Illness, unspecified: Secondary | ICD-10-CM | POA: Diagnosis not present

## 2019-08-10 ENCOUNTER — Ambulatory Visit (INDEPENDENT_AMBULATORY_CARE_PROVIDER_SITE_OTHER): Payer: Medicare HMO | Admitting: *Deleted

## 2019-08-10 ENCOUNTER — Ambulatory Visit: Payer: Medicare HMO

## 2019-08-10 ENCOUNTER — Other Ambulatory Visit: Payer: Self-pay

## 2019-08-10 DIAGNOSIS — Z0000 Encounter for general adult medical examination without abnormal findings: Secondary | ICD-10-CM

## 2019-08-10 NOTE — Progress Notes (Addendum)
MEDICARE ANNUAL WELLNESS VISIT  08/10/2019  Telephone Visit Disclaimer This Medicare AWV was conducted by telephone due to national recommendations for restrictions regarding the COVID-19 Pandemic (e.g. social distancing).  I verified, using two identifiers, that I am speaking with Charles Castro or their authorized healthcare agent. I discussed the limitations, risks, security, and privacy concerns of performing an evaluation and management service by telephone and the potential availability of an in-person appointment in the future. The patient expressed understanding and agreed to proceed.   Subjective:  Charles Castro is a 74 y.o. male patient of Dettinger, Fransisca Kaufmann, MD who had a Medicare Annual Wellness Visit today via telephone. Charles Castro is Retired and lives with their family. Charles Castro has 1 child. Charles Castro reports that Charles Castro is socially active and does interact with friends/family regularly. Charles Castro is moderately physically active and enjoys yard work, Geophysical data processor work.  Patient Care Team: Dettinger, Fransisca Kaufmann, MD as PCP - General (Family Medicine) Josue Hector, MD as PCP - Cardiology (Cardiology)  Advanced Directives 08/10/2019 04/26/2018 04/26/2018 05/30/2016  Does Patient Have a Medical Advance Directive? Yes No No No  Type of Paramedic of [place];Living will - - -  Does patient want to make changes to medical advance directive? No - Patient declined - - -  Copy of [place] in Chart? No - copy requested - - -  Would patient like information on creating a medical advance directive? - No - Patient declined No - Patient declined No - patient declined information    Hospital Utilization Over the Past 12 Months: # of hospitalizations or ER visits: 0 # of surgeries: 0  Review of Systems    Patient reports that Charles Castro overall health is unchanged compared to last year.  History obtained from chart review  Patient Reported Readings (BP, Pulse,  CBG, Weight, etc) none  Pain Assessment Pain : No/denies pain     Current Medications & Allergies (verified) Allergies as of 08/10/2019   No Known Allergies      Medication List        Accurate as of August 10, 2019  1:52 PM. If you have any questions, ask your nurse or doctor.          aspirin 81 MG EC tablet Take 1 tablet (81 mg total) by mouth daily.   atorvastatin 80 MG tablet Commonly known as: LIPITOR Take 1 tablet (80 mg total) by mouth daily.   budesonide-formoterol 160-4.5 MCG/ACT inhaler Commonly known as: SYMBICORT Inhale 2 puffs into the lungs daily as needed.   buPROPion 150 MG 12 hr tablet Commonly known as: Wellbutrin SR Take 1 tablet (150 mg total) by mouth 2 (two) times daily.   clopidogrel 75 MG tablet Commonly known as: PLAVIX Take 1 tablet (75 mg total) by mouth daily.   glipiZIDE 5 MG 24 hr tablet Commonly known as: GLUCOTROL XL Take 1 tablet (5 mg total) by mouth daily.   lisinopril 2.5 MG tablet Commonly known as: ZESTRIL Take 1 tablet (2.5 mg total) by mouth daily.   metFORMIN 500 MG 24 hr tablet Commonly known as: GLUCOPHAGE-XR Take 1 tablet (500 mg total) by mouth See admin instructions. 1 tablet with breakfast & 2 tablets with supper   nitroGLYCERIN 0.4 MG SL tablet Commonly known as: NITROSTAT Place 1 tablet (0.4 mg total) under the tongue every 5 (five) minutes x 3 doses as needed for chest pain.   pantoprazole 20 MG  tablet Commonly known as: PROTONIX Take 1 tablet (20 mg total) by mouth daily.        History (reviewed): Past Medical History:  Diagnosis Date   Anemia    Colon polyp    COPD (chronic obstructive pulmonary disease) (HCC)    Diverticulosis    DM type 2 with diabetic dyslipidemia (HCC)    HTN (hypertension)    MI (myocardial infarction) ([place]) 05/28/2016   DES to RCA   Nicotine dependence    STEMI (ST elevation myocardial infarction) ([place])    Type 2 diabetes mellitus ([place])    Past Surgical  History:  Procedure Laterality Date   CARDIAC CATHETERIZATION N/A 05/28/2016   Procedure: Left Heart Cath and Coronary Angiography;  Surgeon: Nelva Bush, MD;  Location: [facility];  Service: Cardiovascular;  Laterality: N/A;   CARDIAC CATHETERIZATION N/A 05/28/2016   Procedure: Coronary Stent Intervention;  Surgeon: Nelva Bush, MD;  Location: [facility];  Service: Cardiovascular;  Laterality: N/A;   Family History  Problem Relation Age of Onset   CAD Father         with  multiple MI, first at age 54   Diabetes Father    CAD Brother        First Mi at age 41,   Diabetes Brother    Colon cancer Mother        in her 4s    Diabetes Sister    Social History   Socioeconomic History   Marital status: Widowed    Spouse name: Not on file   Number of children: 1   Years of education: Not on file   Highest education level: High school graduate  Occupational History   Occupation: Retired    Comment: 2008  Tobacco Use   Smoking status: Former Smoker    Packs/day: 0.50    Years: 30.00    Pack years: 15.00    Types: Cigarettes    Start date: 05/15/1968    Quit date: 06/10/2019    Years since quitting: 0.1   Smokeless tobacco: Never Used  Substance and Sexual Activity   Alcohol use: Not Currently   Drug use: No   Sexual activity: Not Currently    Birth control/protection: None  Other Topics Concern   Not on file  Social History Narrative   Not on file   Social Determinants of Health   Financial Resource Strain: Low Risk    Difficulty of Paying Living Expenses: Not hard at all  Food Insecurity: No Food Insecurity   Worried About Charity fundraiser in the Last Year: Never true   [place] in the Last Year: Never true  Transportation Needs: No Transportation Needs   Lack of Transportation (Medical): No   Lack of Transportation (Non-Medical): No  Physical Activity: Insufficiently Active   Days of Exercise per Week: 4 days   Minutes of Exercise  per Session: 30 min  Stress: No Stress Concern Present   Feeling of Stress : Not at all  Social Connections: Slightly Isolated   Frequency of Communication with Friends and Family: More than three times a week   Frequency of Social Gatherings with Friends and Family: More than three times a week   Attends Religious Services: More than 4 times per year   Active Member of [employer] or Organizations: Yes   Attends Archivist Meetings: More than 4 times per year   Marital Status: Widowed    Activities of Daily  Living In your present state of health, do you have any difficulty performing the following activities: 08/10/2019  Hearing? Y  Comment states Charles Castro hearing isn't as good as it used to be when Charles Castro was younger  Vision? N  Comment wears glasses to read-gets yearly eye exam  Difficulty concentrating or making decisions? N  Walking or climbing stairs? N  Dressing or bathing? N  Doing errands, shopping? N  Preparing Food and eating ? N  Using the Toilet? N  In the past six months, have you accidently leaked urine? N  Do you have problems with loss of bowel control? N  Managing your Medications? N  Managing your Finances? N  Housekeeping or managing your Housekeeping? N  Some recent data might be hidden    Patient Education/ Literacy How often do you need to have someone help you when you read instructions, pamphlets, or other written materials from your doctor or pharmacy?: 2 - Rarely What is the last grade level you completed in school?: High School  Exercise Current Exercise Habits: Home exercise routine, Type of exercise: walking, Time (Minutes): 30, Frequency (Times/Week): 4, Weekly Exercise (Minutes/Week): 120, Exercise limited by: cardiac condition(s);respiratory conditions(s)  Diet Patient reports consuming 3 meals a day and 0 snack(s) a day Patient reports that Charles Castro primary diet is: Regular Patient reports that she does have regular access to food.   Depression  Screen PHQ 2/9 Scores 08/10/2019 05/29/2019 02/24/2019  PHQ - 2 Score 0 0 0     Fall Risk Fall Risk  08/10/2019 05/29/2019 02/24/2019  Falls in the past year? 0 0 0     Objective:  Charles Castro seemed alert and oriented and Charles Castro participated appropriately during our telephone visit.  Blood Pressure Weight BMI  BP Readings from Last 3 Encounters:  05/29/19 (!) 146/79  02/24/19 (!) 144/89  09/12/18 116/70   Wt Readings from Last 3 Encounters:  05/29/19 178 lb 9.6 oz (81 kg)  02/24/19 172 lb 9.6 oz (78.3 kg)  09/12/18 180 lb (81.6 kg)   BMI Readings from Last 1 Encounters:  05/29/19 24.91 kg/m    *Unable to obtain current vital signs, weight, and BMI due to telephone visit type  Hearing/Vision  Charles Castro did not seem to have difficulty with hearing/understanding during the telephone conversation Reports that Charles Castro has not had a formal eye exam by an eye care professional within the past year Reports that Charles Castro has not had a formal hearing evaluation within the past year *Unable to fully assess hearing and vision during telephone visit type  Cognitive Function: 6CIT Screen 08/10/2019  What Year? 0 points  What month? 0 points  What time? 0 points  Count back from 20 0 points  Months in reverse 4 points  Repeat phrase 4 points  Total Score 8   (Normal:0-7, Significant for Dysfunction: >8)  Normal Cognitive Function Screening: No: Recommend repeating cognitive screening at Charles Castro next visit with Charles Castro PCP   Immunization & Health Maintenance Record Immunization History  Administered Date(s) Administered   Fluad Quad(high Dose 65+) 05/04/2019   Influenza, High Dose Seasonal PF 05/07/2015, 05/12/2017, 05/17/2018   Influenza, Seasonal, Injecte, Preservative Fre 05/31/2016   Influenza,inj,Quad PF,6+ Mos 05/31/2016   Influenza,trivalent, recombinat, inj, PF 05/01/2014   Pneumococcal Conjugate-13 08/19/2016, 02/15/2018   Pneumococcal Polysaccharide-23 02/04/2015   Tdap 02/04/2015    Health  Maintenance  Topic Date Due   Hepatitis C Screening  01/09/1946   FOOT EXAM  05/15/1958   OPHTHALMOLOGY EXAM  10/21/2018   HEMOGLOBIN A1C  11/26/2019   COLONOSCOPY  08/05/2020   TETANUS/TDAP  02/03/2025   INFLUENZA VACCINE  Completed   PNA vac Low Risk Adult  Completed       Assessment  This is a routine wellness examination for Charles Castro.  Health Maintenance: Due or Overdue Health Maintenance Due  Topic Date Due   Hepatitis C Screening  Jan 09, 1946   FOOT EXAM  05/15/1958   OPHTHALMOLOGY EXAM  10/21/2018    Charles Castro does not need a referral for Community Assistance: Care Management:   no Social Work:    no Prescription Assistance:  no Nutrition/Diabetes Education:  no   Plan:  Personalized Goals Goals Addressed             This Visit's Progress    DIET - INCREASE WATER INTAKE       Try to drink 6-8 glasses of water daily       Personalized Health Maintenance & Screening Recommendations  Advanced directives: has an advanced directive - a copy HAS NOT been provided.  Shingles vaccine Diabetic Eye Exam  Lung Cancer Screening Recommended: yes-declines at this time (Low Dose CT Chest recommended if Age 71-80 years, 30 pack-year currently smoking OR have quit w/in past 15 years) Hepatitis C Screening recommended: no HIV Screening recommended: no  Advanced Directives: Written information was not prepared per patient's request.  Referrals & Orders No orders of the defined types were placed in this encounter.   Follow-up Plan Follow-up with Dettinger, Fransisca Kaufmann, MD as planned Schedule your Diabetic Eye Exam as discussed Consider Shingles vaccine at your next visit with your PCP   I have personally reviewed and noted the following in the patient's chart:   Medical and social history Use of alcohol, tobacco or illicit drugs  Current medications and supplements Functional ability and status Nutritional status Physical activity Advanced  directives List of other physicians Hospitalizations, surgeries, and ER visits in previous 12 months Vitals Screenings to include cognitive, depression, and falls Referrals and appointments  In addition, I have reviewed and discussed with Charles Castro certain preventive protocols, quality metrics, and best practice recommendations. A written personalized care plan for preventive services as well as general preventive health recommendations is available and can be mailed to the patient at Charles Castro request.      Milas Hock, LPN  [id number]    I have reviewed and agree with the above AWV documentation.   Evelina Dun, FNP

## 2019-08-10 NOTE — Patient Instructions (Signed)

## 2019-08-29 ENCOUNTER — Ambulatory Visit: Payer: Medicare HMO | Admitting: Family Medicine

## 2019-10-02 ENCOUNTER — Other Ambulatory Visit: Payer: Self-pay

## 2019-10-02 ENCOUNTER — Encounter: Payer: Self-pay | Admitting: Family Medicine

## 2019-10-02 ENCOUNTER — Ambulatory Visit (INDEPENDENT_AMBULATORY_CARE_PROVIDER_SITE_OTHER): Payer: Medicare HMO | Admitting: Family Medicine

## 2019-10-02 VITALS — BP 144/71 | HR 65 | Temp 98.7°F | Ht 71.0 in | Wt 172.0 lb

## 2019-10-02 DIAGNOSIS — J439 Emphysema, unspecified: Secondary | ICD-10-CM

## 2019-10-02 DIAGNOSIS — E1169 Type 2 diabetes mellitus with other specified complication: Secondary | ICD-10-CM | POA: Diagnosis not present

## 2019-10-02 DIAGNOSIS — E782 Mixed hyperlipidemia: Secondary | ICD-10-CM

## 2019-10-02 LAB — BAYER DCA HB A1C WAIVED: HB A1C (BAYER DCA - WAIVED): 6.2 % (ref <7.0)

## 2019-10-02 NOTE — Progress Notes (Signed)
BP (!) 144/71   Pulse 65   Temp 98.7 F (37.1 C)   Ht 5' 11" (1.803 m)   Wt 172 lb (78 kg)   SpO2 95%   BMI 23.99 kg/m    Subjective:   Patient ID: Charles Castro, male    DOB: [birth year], 74 y.o.   MRN: [id number]  HPI: Charles Castro is a 74 y.o. male presenting on 10/02/2019 for Medical Management of Chronic Issues and Diabetes   HPI Type 2 diabetes mellitus Patient comes in today for recheck of his diabetes. Patient has been currently taking glipizide and metformin. Patient is currently on an ACE inhibitor/ARB. Patient has not seen an ophthalmologist this year. Patient denies any issues with their feet.   Hypertension Patient is currently on lisinopril, and their blood pressure today is 144/71. Patient denies any lightheadedness or dizziness. Patient denies headaches, blurred vision, chest pains, shortness of breath, or weakness. Denies any side effects from medication and is content with current medication.   Hyperlipidemia Patient is coming in for recheck of his hyperlipidemia. The patient is currently taking atorvastatin. They deny any issues with myalgias or history of liver damage from it. They deny any focal numbness or weakness or chest pain.   COPD Patient is coming in for COPD recheck today.  He is currently on Symbicort.  He has a mild chronic cough but denies any major coughing spells or wheezing spells.  He has 0nighttime symptoms per week and 0daytime symptoms per week currently.  Relevant past medical, surgical, family and social history reviewed and updated as indicated. Interim medical history since our last visit reviewed. Allergies and medications reviewed and updated.  Review of Systems  Constitutional: Negative for chills and fever.  Eyes: Negative for visual disturbance.  Respiratory: Negative for shortness of breath and wheezing.   Cardiovascular: Negative for chest pain and leg swelling.  Musculoskeletal: Negative for back pain and gait problem.    Skin: Negative for rash.  Neurological: Negative for dizziness, weakness and light-headedness.  All other systems reviewed and are negative.   Per HPI unless specifically indicated above   Allergies as of 10/02/2019   No Known Allergies     Medication List       Accurate as of October 02, 2019 11:42 AM. If you have any questions, ask your nurse or doctor.        STOP taking these medications   buPROPion 150 MG 12 hr tablet Commonly known as: Wellbutrin SR Stopped by: Worthy Rancher, MD     TAKE these medications   acetaminophen 325 MG tablet Commonly known as: TYLENOL Take 650 mg by mouth as needed.   aspirin 81 MG EC tablet Take 1 tablet (81 mg total) by mouth daily.   atorvastatin 80 MG tablet Commonly known as: LIPITOR Take 1 tablet (80 mg total) by mouth daily.   budesonide-formoterol 160-4.5 MCG/ACT inhaler Commonly known as: SYMBICORT Inhale 2 puffs into the lungs daily as needed.   clopidogrel 75 MG tablet Commonly known as: PLAVIX Take 1 tablet (75 mg total) by mouth daily.   glipiZIDE 5 MG 24 hr tablet Commonly known as: GLUCOTROL XL Take 1 tablet (5 mg total) by mouth daily.   lisinopril 2.5 MG tablet Commonly known as: ZESTRIL Take 1 tablet (2.5 mg total) by mouth daily.   metFORMIN 500 MG 24 hr tablet Commonly known as: GLUCOPHAGE-XR Take 1 tablet (500 mg total) by mouth See admin instructions. 1 tablet with breakfast &  2 tablets with supper   nitroGLYCERIN 0.4 MG SL tablet Commonly known as: NITROSTAT Place 1 tablet (0.4 mg total) under the tongue every 5 (five) minutes x 3 doses as needed for chest pain.   pantoprazole 20 MG tablet Commonly known as: PROTONIX Take 1 tablet (20 mg total) by mouth daily.        Objective:   BP (!) 144/71   Pulse 65   Temp 98.7 F (37.1 C)   Ht 5' 11" (1.803 m)   Wt 172 lb (78 kg)   SpO2 95%   BMI 23.99 kg/m   Wt Readings from Last 3 Encounters:  10/02/19 172 lb (78 kg)  05/29/19 178 lb 9.6  oz (81 kg)  02/24/19 172 lb 9.6 oz (78.3 kg)    Physical Exam Vitals and nursing note reviewed.  Constitutional:      General: He is not in acute distress.    Appearance: He is well-developed. He is not diaphoretic.  Eyes:     General: No scleral icterus.    Conjunctiva/sclera: Conjunctivae normal.  Neck:     Thyroid: No thyromegaly.  Cardiovascular:     Rate and Rhythm: Normal rate and regular rhythm.     Heart sounds: Normal heart sounds. No murmur.  Pulmonary:     Effort: Pulmonary effort is normal. No respiratory distress.     Breath sounds: Normal breath sounds. No wheezing.  Musculoskeletal:        General: Normal range of motion.     Cervical back: Neck supple.  Lymphadenopathy:     Cervical: No cervical adenopathy.  Skin:    General: Skin is warm and dry.     Findings: No rash.  Neurological:     Mental Status: He is alert and oriented to person, place, and time.     Coordination: Coordination normal.  Psychiatric:        Behavior: Behavior normal.     Results for orders placed or performed in visit on 06/02/19  HM DIABETES EYE EXAM  Result Value Ref Range   HM Diabetic Eye Exam No Retinopathy No Retinopathy    Assessment & Plan:   Problem List Items Addressed This Visit      Respiratory   COPD (chronic obstructive pulmonary disease) ([place])     Endocrine   DM (diabetes mellitus) ([place]) - Primary   Relevant Orders   Bayer DCA Hb A1c Waived   CMP14+EGFR     Other   HLD (hyperlipidemia)   Relevant Orders   Lipid panel      Continue current medication, sounds like he is doing well, he was able to quit smoking so we will stop the Wellbutrin. Follow up plan: Return in about 3 months (around 01/02/2020), or if symptoms worsen or fail to improve, for Diabetes and hypertension and cholesterol recheck.  Counseling provided for all of the vaccine components Orders Placed This Encounter  Procedures  . Bayer DCA Hb A1c Waived  . Lipid panel  . Palmer, MD [facility] 10/02/2019, 11:42 AM

## 2019-10-03 LAB — CMP14+EGFR
ALT: 13 IU/L (ref 0–44)
AST: 19 IU/L (ref 0–40)
Albumin/Globulin Ratio: 1.7 (ref 1.2–2.2)
Albumin: 4 g/dL (ref 3.7–4.7)
Alkaline Phosphatase: 83 IU/L (ref 39–117)
BUN/Creatinine Ratio: 10 (ref 10–24)
BUN: 8 mg/dL (ref 8–27)
Bilirubin Total: 0.4 mg/dL (ref 0.0–1.2)
CO2: 25 mmol/L (ref 20–29)
Calcium: 9.1 mg/dL (ref 8.6–10.2)
Chloride: 103 mmol/L (ref 96–106)
Creatinine, Ser: 0.77 mg/dL (ref 0.76–1.27)
GFR calc Af Amer: 106 mL/min/{1.73_m2} (ref >59)
GFR calc non Af Amer: 91 mL/min/{1.73_m2} (ref >59)
Globulin, Total: 2.3 g/dL (ref 1.5–4.5)
Glucose: 97 mg/dL (ref 65–99)
Potassium: 4.2 mmol/L (ref 3.5–5.2)
Sodium: 139 mmol/L (ref 134–144)
Total Protein: 6.3 g/dL (ref 6.0–8.5)

## 2019-10-03 LAB — LIPID PANEL
Chol/HDL Ratio: 2.4 ratio (ref 0.0–5.0)
Cholesterol, Total: 85 mg/dL — ABNORMAL LOW (ref 100–199)
HDL: 35 mg/dL — ABNORMAL LOW (ref >39)
LDL Chol Calc (NIH): 36 mg/dL (ref 0–99)
Triglycerides: 60 mg/dL (ref 0–149)
VLDL Cholesterol Cal: 14 mg/dL (ref 5–40)

## 2019-11-03 IMAGING — DX DG CHEST 2V
2 series · 2 of 2 positions shown · non-contrast
Comparison: Chest x-ray of July 07, 2017

CLINICAL DATA: Mid chest discomfort radiating toward the left.
History of coronary artery disease with stent placement in 2130.
Current smoker. History of COPD. Previous STEMI.

EXAM:
CHEST - 2 VIEW

[chest pa]
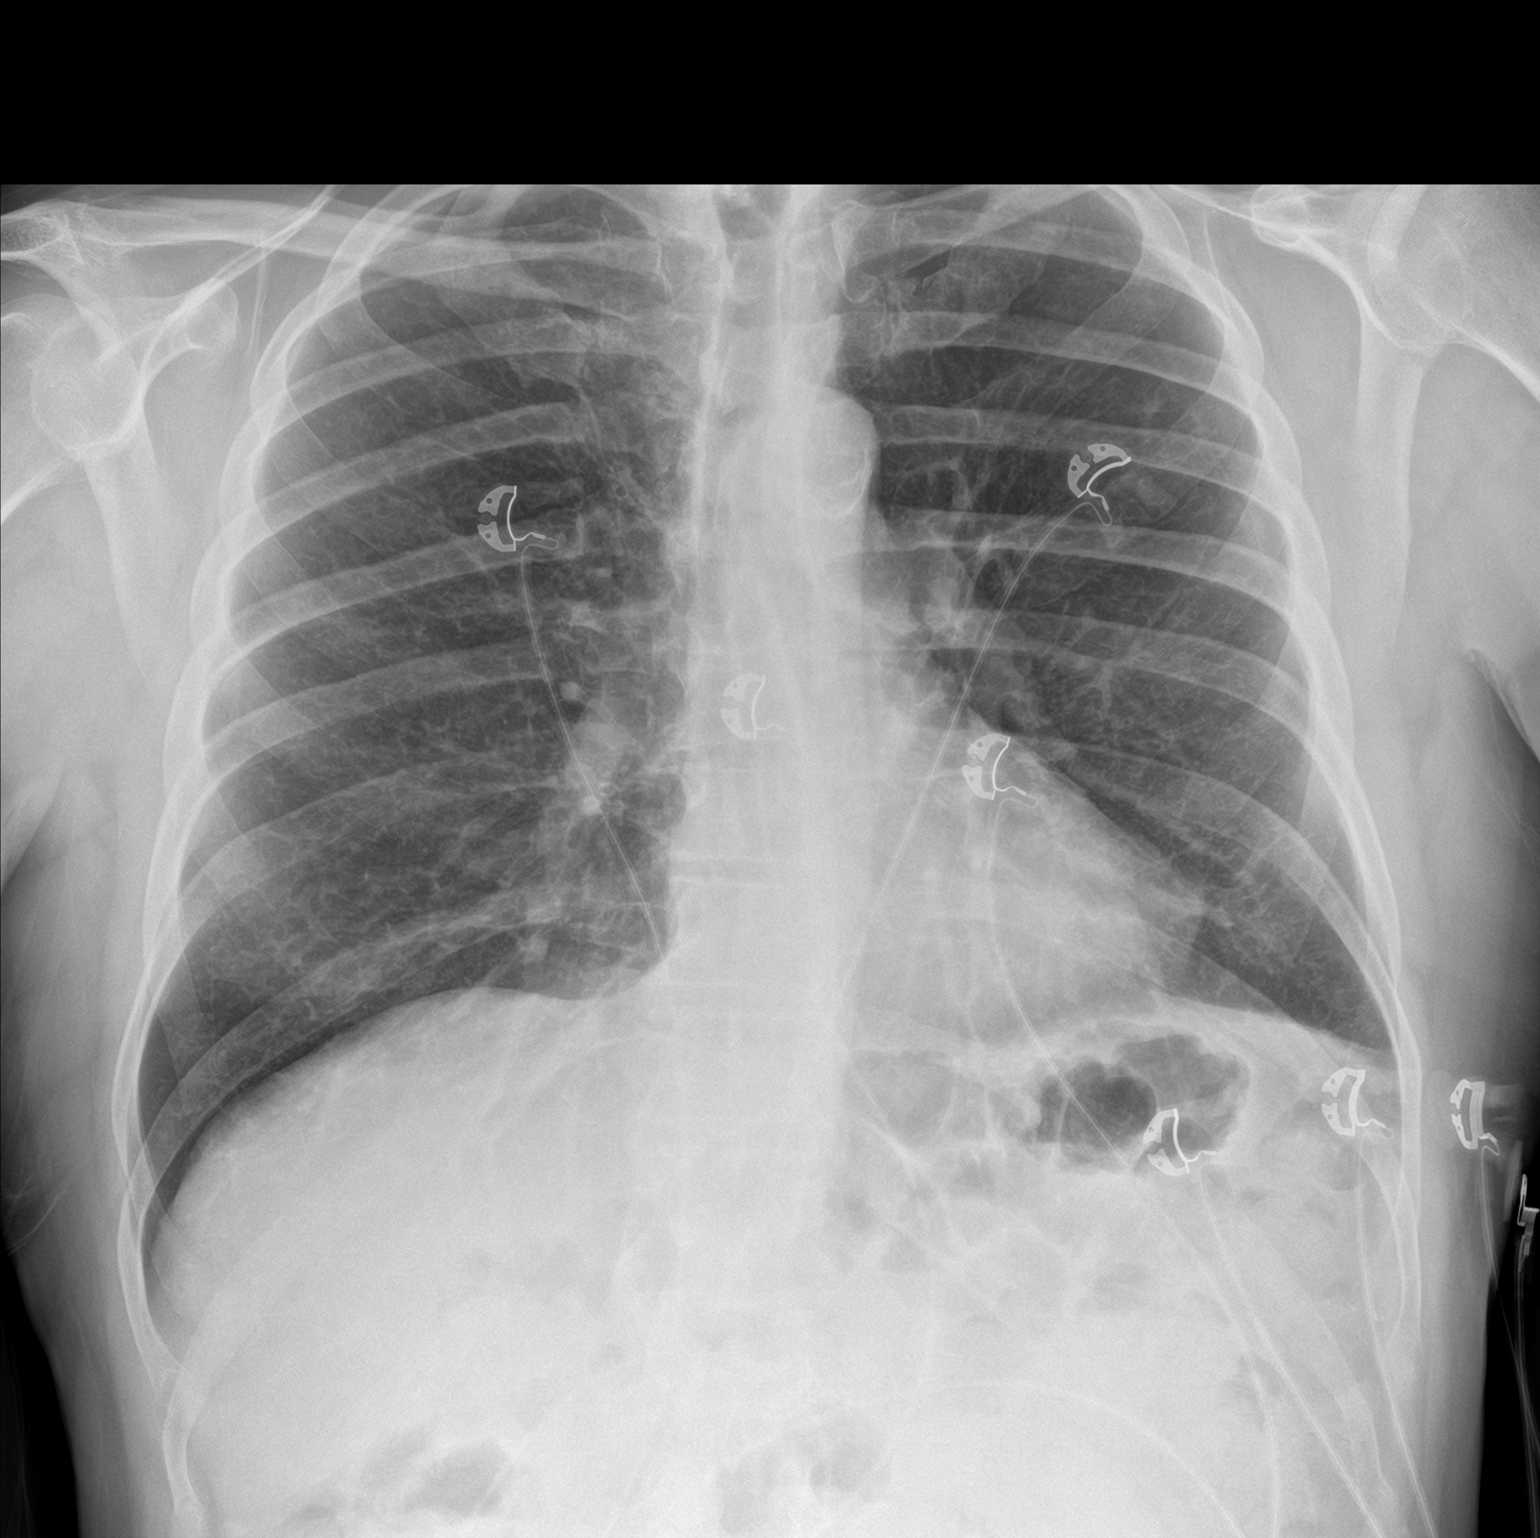

[chest lat]
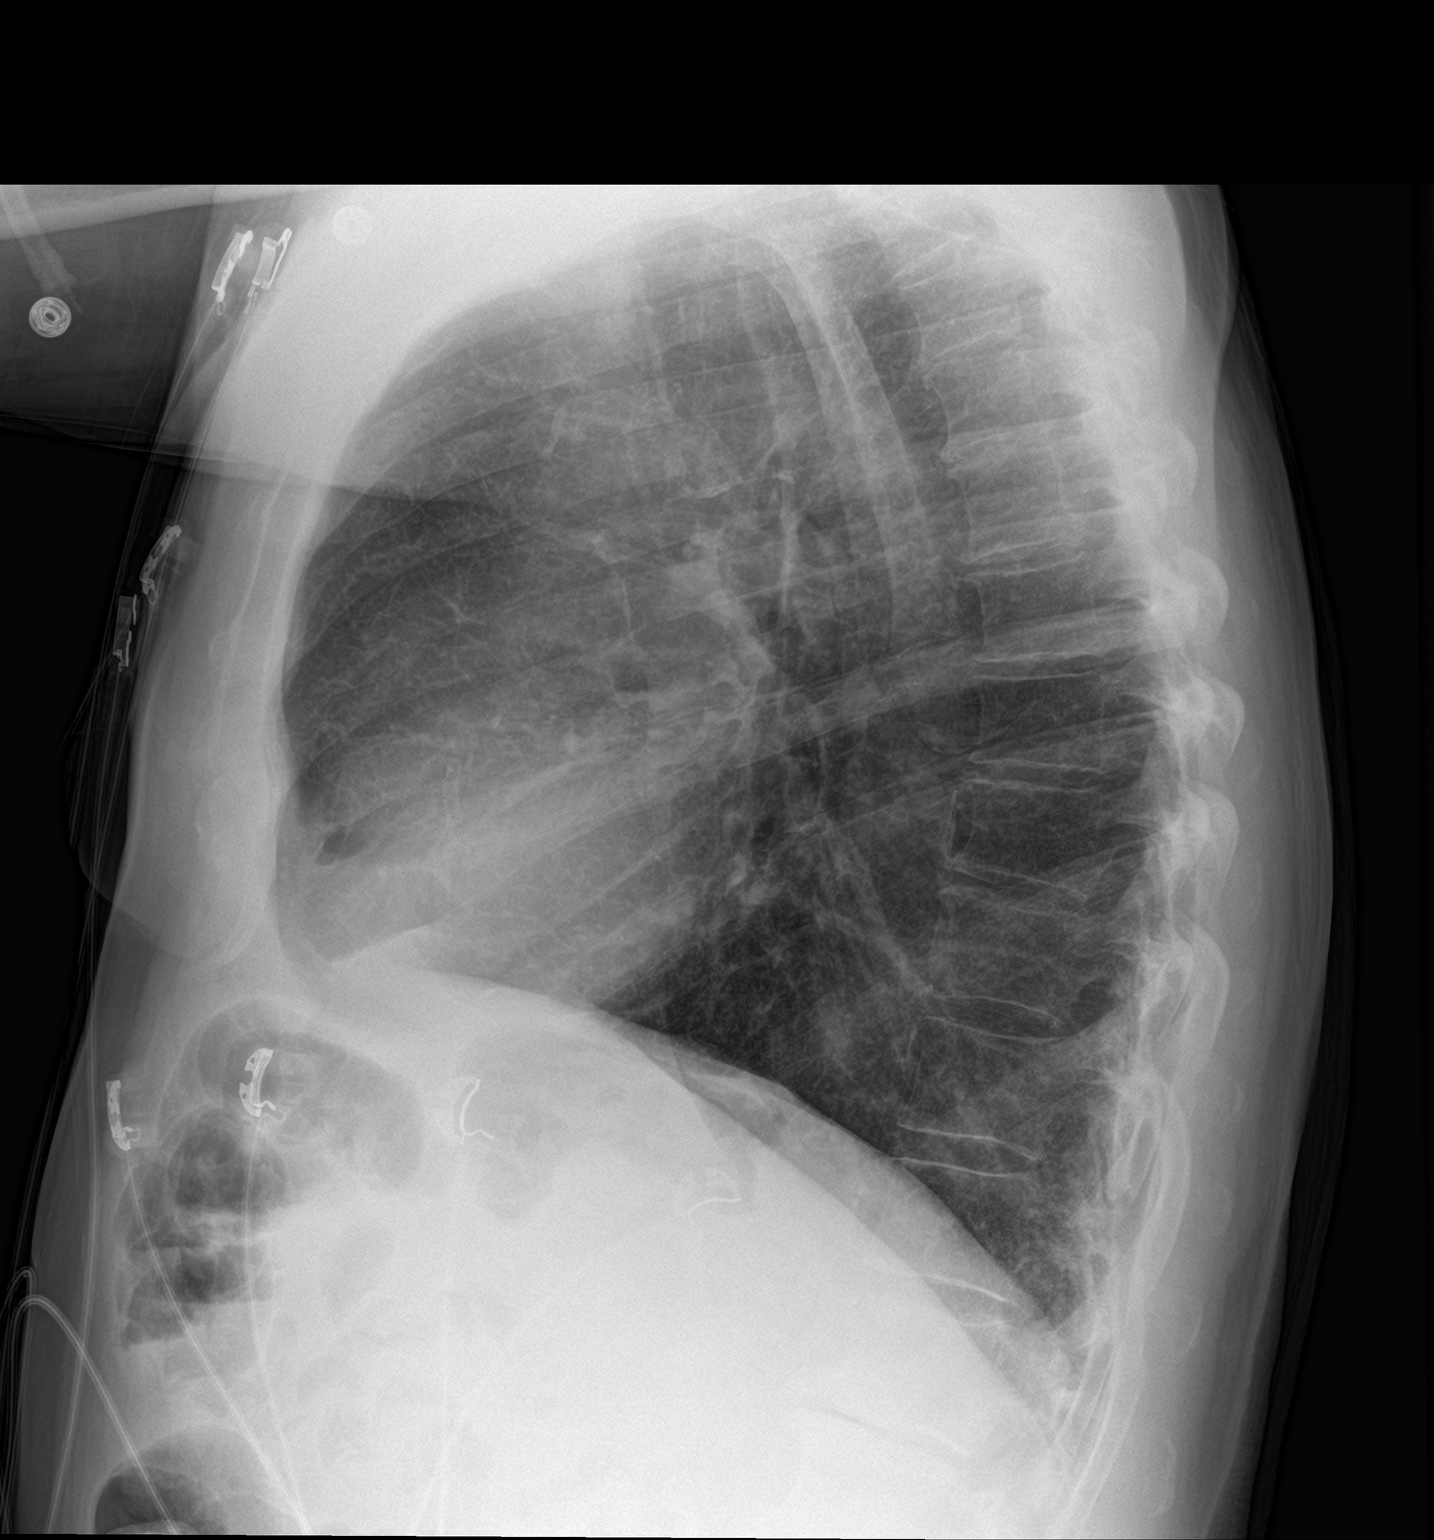

[2 of 2 positions shown; findings below may reference images not displayed]

FINDINGS: The lungs are well-expanded. There is mild interstitial prominence
which is stable. There is no alveolar infiltrate nor pleural
effusion. The heart and pulmonary vascularity are normal. There is
calcification in the wall of the aortic arch. The bony thorax
exhibits no acute abnormality.
IMPRESSION: Mild chronic bronchitic-smoking related changes, stable. No
pneumonia, CHF, nor other acute cardiopulmonary abnormality.

Thoracic aortic atherosclerosis.

## 2020-01-01 NOTE — Progress Notes (Deleted)
Cardiology Office Note    Date:  01/01/2020   ID:  Charles Castro, DOB 06/12/1946, MRN [id number]  PCP:  Dettinger, Fransisca Kaufmann, MD  Cardiologist: Jenkins Rouge, MD    No chief complaint on file.   History of Present Illness:    Charles Castro is a 74 y.o. male with past medical history of CAD (s/p STEMI in 05/2016 with DES to mid-RCA), HTN, HLD, GERD, Type 2 DM, and tobacco use who presents to the office today for follow-up.  Admitted to [facility] from 04/26/2018 to 04/27/2018 for evaluation of chest discomfort. His pain was overall felt to be atypical for a cardiac etiology his symptoms started after he consumed a taco  Cyclic troponin values remained negative and his EKG showed no acute ischemic changes. An echocardiogram was performed and showed a preserved EF of 60% with no regional wall motion abnormalities. No changes were made to his medication regimen and he was discharged home.  No angina on plavix now as Brilinta was too expensive    Has had cough with congestion Finishing Z pack   ***  Past Medical History:  Diagnosis Date  . Anemia   . Colon polyp   . COPD (chronic obstructive pulmonary disease) ([place])   . Diverticulosis   . DM type 2 with diabetic dyslipidemia ([place])   . HTN (hypertension)   . MI (myocardial infarction) ([place]) 05/28/2016   DES to RCA  . Nicotine dependence   . STEMI (ST elevation myocardial infarction) ([place])   . Type 2 diabetes mellitus ([place])     Past Surgical History:  Procedure Laterality Date  . CARDIAC CATHETERIZATION N/A 05/28/2016   Procedure: Left Heart Cath and Coronary Angiography;  Surgeon: Nelva Bush, MD;  Location: [place] [facility];  Service: Cardiovascular;  Laterality: N/A;  . CARDIAC CATHETERIZATION N/A 05/28/2016   Procedure: Coronary Stent Intervention;  Surgeon: Nelva Bush, MD;  Location: [place] [facility];  Service: Cardiovascular;  Laterality: N/A;    Current Medications: Outpatient Medications Prior to Visit    Medication Sig Dispense Refill  . acetaminophen (TYLENOL) 325 MG tablet Take 650 mg by mouth as needed.    Marland Kitchen aspirin EC 81 MG EC tablet Take 1 tablet (81 mg total) by mouth daily. 30 tablet 11  . atorvastatin (LIPITOR) 80 MG tablet Take 1 tablet (80 mg total) by mouth daily. 90 tablet 3  . budesonide-formoterol (SYMBICORT) 160-4.5 MCG/ACT inhaler Inhale 2 puffs into the lungs daily as needed. 1 Inhaler 5  . clopidogrel (PLAVIX) 75 MG tablet Take 1 tablet (75 mg total) by mouth daily. 90 tablet 3  . glipiZIDE (GLUCOTROL XL) 5 MG 24 hr tablet Take 1 tablet (5 mg total) by mouth daily. 90 tablet 3  . lisinopril (ZESTRIL) 2.5 MG tablet Take 1 tablet (2.5 mg total) by mouth daily. 90 tablet 3  . metFORMIN (GLUCOPHAGE-XR) 500 MG 24 hr tablet Take 1 tablet (500 mg total) by mouth See admin instructions. 1 tablet with breakfast & 2 tablets with supper 90 tablet 3  . nitroGLYCERIN (NITROSTAT) 0.4 MG SL tablet Place 1 tablet (0.4 mg total) under the tongue every 5 (five) minutes x 3 doses as needed for chest pain. 25 tablet 3  . pantoprazole (PROTONIX) 20 MG tablet Take 1 tablet (20 mg total) by mouth daily. 90 tablet 3   No facility-administered medications prior to visit.     Allergies:   Patient has no known allergies.   Social History  Socioeconomic History  . Marital status: Widowed    Spouse name: Not on file  . Number of children: 1  . Years of education: Not on file  . Highest education level: High school graduate  Occupational History  . Occupation: Retired    Comment: 2008  Tobacco Use  . Smoking status: Former Smoker    Packs/day: 0.50    Years: 30.00    Pack years: 15.00    Types: Cigarettes    Start date: 05/15/1968    Quit date: 06/10/2019    Years since quitting: 0.5  . Smokeless tobacco: Never Used  Substance and Sexual Activity  . Alcohol use: Not Currently  . Drug use: No  . Sexual activity: Not Currently    Birth control/protection: None  Other Topics Concern   . Not on file  Social History Narrative  . Not on file   Social Determinants of Health   Financial Resource Strain: Low Risk   . Difficulty of Paying Living Expenses: Not hard at all  Food Insecurity: No Food Insecurity  . Worried About Charity fundraiser in the Last Year: Never true  . Ran Out of Food in the Last Year: Never true  Transportation Needs: No Transportation Needs  . Lack of Transportation (Medical): No  . Lack of Transportation (Non-Medical): No  Physical Activity: Insufficiently Active  . Days of Exercise per Week: 4 days  . Minutes of Exercise per Session: 30 min  Stress: No Stress Concern Present  . Feeling of Stress : Not at all  Social Connections: Slightly Isolated  . Frequency of Communication with Friends and Family: More than three times a week  . Frequency of Social Gatherings with Friends and Family: More than three times a week  . Attends Religious Services: More than 4 times per year  . Active Member of Clubs or Organizations: Yes  . Attends Archivist Meetings: More than 4 times per year  . Marital Status: Widowed     Family History:  The patient's family history includes CAD in his brother and father; Colon cancer in his mother; Diabetes in his brother, father, and sister.   Review of Systems:   Please see the history of present illness.     General:  No chills, fever, night sweats or weight changes.  Cardiovascular:  No chest pain, dyspnea on exertion, edema, orthopnea, palpitations, paroxysmal nocturnal dyspnea. Positive for chest pain (now resolved).  Dermatological: No rash, lesions/masses Respiratory: No cough, dyspnea Urologic: No hematuria, dysuria Abdominal:   No nausea, vomiting, diarrhea, bright red blood per rectum, melena, or hematemesis Neurologic:  No visual changes, wkns, changes in mental status. All other systems reviewed and are otherwise negative except as noted above.   Physical Exam:    VS:  There were no  vitals taken for this visit.   Affect appropriate Healthy:  appears stated age 48: normal Neck supple with no adenopathy JVP normal no bruits no thyromegaly Lungs bilateral exp  wheezing and good diaphragmatic motion Heart:  S1/S2 no murmur, no rub, gallop or click PMI normal Abdomen: benighn, BS positve, no tenderness, no AAA no bruit.  No HSM or HJR Distal pulses intact with no bruits No edema Neuro non-focal Skin warm and dry No muscular weakness   Wt Readings from Last 3 Encounters:  10/02/19 172 lb (78 kg)  05/29/19 178 lb 9.6 oz (81 kg)  02/24/19 172 lb 9.6 oz (78.3 kg)     Studies/Labs Reviewed:  EKG:  SR RBBB no acute changes 09/12/18  Recent Labs: 10/02/2019: ALT 13; BUN 8; Creatinine, Ser 0.77; Potassium 4.2; Sodium 139   Lipid Panel    Component Value Date/Time   CHOL 85 (L) 10/02/2019 1342   TRIG 60 10/02/2019 1342   HDL 35 (L) 10/02/2019 1342   CHOLHDL 2.4 10/02/2019 1342   CHOLHDL 2.0 06/08/2016 0827   VLDL 11 06/08/2016 0827   LDLCALC 36 10/02/2019 1342    Additional studies/ records that were reviewed today include:   Cardiac Catheterization: 05/2016 Conclusions: 1. Significant one vessel coronary artery disease with 60% proximal and 100% mid RCA stenoses. 2. Mild to moderate, nonobstructive CAD involving the LMCA, LAD, LCx, and distal RCA. 3. Normal left ventricular filling pressure. 4. Successful PCI to proximal and mid RCA with placement of a Promus Premier 3.0 x 38 mm drug-eluting stent (post dilated to 3.5 mm) with 0% residual stenosis and TIMI-3 flow.  Recommendations: 1. Dual antiplatelet therapy with aspirin and ticagrelor for at least 12 months. 2. Aggressive secondary prevention. 3. Obtain transthoracic echocardiogram in AM for evaluation of LV function. 4. Remove right femoral vein sheath when ACT < 150 seconds.    Echocardiogram: 04/26/2018 Study Conclusions  - Left ventricle: The cavity size was normal. Wall thickness  was   increased in a pattern of mild LVH. Systolic function was normal.   The estimated ejection fraction was 60%. Wall motion was normal;   there were no regional wall motion abnormalities. Left   ventricular diastolic function parameters were normal. - Aortic valve: Trileaflet; mildly thickened leaflets.   Plan:   In order of problems listed above:  1. CAD - s/p STEMI in 05/2016 with DES to mid-RCA. October 2019  admitted for chest pain felt to be atypical for a cardiac etiology and ruled-out for ACS. Echo showed a preserved EF of 60% with no regional wall motion abnormalities. On plavix now due to cost of Brillinta   2. HTN - Well controlled.  Continue current medications and low sodium Dash type diet.     3. HLD - followed by PCP. Goal LDL is < 70 with known CAD. Remains on Atorvastatin 80mg  daily.   4. Tobacco Use - he continues to smoke 0.5 ppd. Cessation advised. No intention of quitting at this time. Lung cancer screening CT ordered    F/U in a year     Jenkins Rouge

## 2020-01-04 ENCOUNTER — Ambulatory Visit (INDEPENDENT_AMBULATORY_CARE_PROVIDER_SITE_OTHER): Payer: Medicare HMO | Admitting: Family Medicine

## 2020-01-04 ENCOUNTER — Other Ambulatory Visit: Payer: Self-pay

## 2020-01-04 ENCOUNTER — Encounter: Payer: Self-pay | Admitting: Family Medicine

## 2020-01-04 VITALS — BP 132/74 | HR 100 | Temp 98.2°F | Ht 71.0 in | Wt 162.0 lb

## 2020-01-04 DIAGNOSIS — E1159 Type 2 diabetes mellitus with other circulatory complications: Secondary | ICD-10-CM

## 2020-01-04 DIAGNOSIS — I10 Essential (primary) hypertension: Secondary | ICD-10-CM

## 2020-01-04 DIAGNOSIS — E782 Mixed hyperlipidemia: Secondary | ICD-10-CM | POA: Diagnosis not present

## 2020-01-04 DIAGNOSIS — E1169 Type 2 diabetes mellitus with other specified complication: Secondary | ICD-10-CM | POA: Diagnosis not present

## 2020-01-04 DIAGNOSIS — I152 Hypertension secondary to endocrine disorders: Secondary | ICD-10-CM

## 2020-01-04 LAB — BAYER DCA HB A1C WAIVED: HB A1C (BAYER DCA - WAIVED): 6.4 % (ref <7.0)

## 2020-01-04 NOTE — Progress Notes (Signed)
BP 132/74   Pulse 100   Temp 98.2 F (36.8 C) (Temporal)   Ht 5' 11"  (1.803 m)   Wt 162 lb (73.5 kg)   BMI 22.59 kg/m    Subjective:   Patient ID: Charles Castro, male    DOB: 1945/08/25, 74 y.o.   MRN: [id number]  HPI: Charles Castro is a 74 y.o. male presenting on 01/04/2020 for Medical Management of Chronic Issues   HPI Type 2 diabetes mellitus Patient comes in today for recheck of his diabetes. Patient has been currently taking Metformin and glipizide. Patient is currently on an ACE inhibitor/ARB. Patient has not seen an ophthalmologist this year. Patient denies any issues with their feet. The symptom started onset as an adult hypertension and hyperlipidemia ARE RELATED TO DM   Hyperlipidemia Patient is coming in for recheck of his hyperlipidemia. The patient is currently taking atorvastatin. They deny any issues with myalgias or history of liver damage from it. They deny any focal numbness or weakness or chest pain.  Hypertension Patient is currently on lisinopril, and their blood pressure today is 132/74. Patient denies any lightheadedness or dizziness. Patient denies headaches, blurred vision, chest pains, shortness of breath, or weakness. Denies any side effects from medication and is content with current medication.   Relevant past medical, surgical, family and social history reviewed and updated as indicated. Interim medical history since our last visit reviewed. Allergies and medications reviewed and updated.  Review of Systems  Constitutional: Negative for chills and fever.  Eyes: Negative for visual disturbance.  Respiratory: Negative for shortness of breath and wheezing.   Cardiovascular: Negative for chest pain and leg swelling.  Musculoskeletal: Negative for back pain and gait problem.  Skin: Negative for rash.  Neurological: Negative for dizziness, weakness and light-headedness.  All other systems reviewed and are negative.   Per HPI unless specifically  indicated above   Allergies as of 01/04/2020   No Known Allergies     Medication List       Accurate as of January 04, 2020 10:59 AM. If you have any questions, ask your nurse or doctor.        acetaminophen 325 MG tablet Commonly known as: TYLENOL Take 650 mg by mouth as needed.   aspirin 81 MG EC tablet Take 1 tablet (81 mg total) by mouth daily.   atorvastatin 80 MG tablet Commonly known as: LIPITOR Take 1 tablet (80 mg total) by mouth daily.   budesonide-formoterol 160-4.5 MCG/ACT inhaler Commonly known as: SYMBICORT Inhale 2 puffs into the lungs daily as needed.   clopidogrel 75 MG tablet Commonly known as: PLAVIX Take 1 tablet (75 mg total) by mouth daily.   glipiZIDE 5 MG 24 hr tablet Commonly known as: GLUCOTROL XL Take 1 tablet (5 mg total) by mouth daily.   lisinopril 2.5 MG tablet Commonly known as: ZESTRIL Take 1 tablet (2.5 mg total) by mouth daily.   metFORMIN 500 MG 24 hr tablet Commonly known as: GLUCOPHAGE-XR Take 1 tablet (500 mg total) by mouth See admin instructions. 1 tablet with breakfast & 2 tablets with supper   nitroGLYCERIN 0.4 MG SL tablet Commonly known as: NITROSTAT Place 1 tablet (0.4 mg total) under the tongue every 5 (five) minutes x 3 doses as needed for chest pain.   pantoprazole 20 MG tablet Commonly known as: PROTONIX Take 1 tablet (20 mg total) by mouth daily.        Objective:   BP 132/74   Pulse  100   Temp 98.2 F (36.8 C) (Temporal)   Ht 5' 11"  (1.803 m)   Wt 162 lb (73.5 kg)   BMI 22.59 kg/m   Wt Readings from Last 3 Encounters:  01/04/20 162 lb (73.5 kg)  10/02/19 172 lb (78 kg)  05/29/19 178 lb 9.6 oz (81 kg)    Physical Exam Vitals and nursing note reviewed.  Constitutional:      General: He is not in acute distress.    Appearance: He is well-developed. He is not diaphoretic.  Eyes:     General: No scleral icterus.    Conjunctiva/sclera: Conjunctivae normal.  Neck:     Thyroid: No thyromegaly.    Cardiovascular:     Rate and Rhythm: Normal rate and regular rhythm.     Heart sounds: Normal heart sounds. No murmur heard.   Pulmonary:     Effort: Pulmonary effort is normal. No respiratory distress.     Breath sounds: Normal breath sounds. No wheezing.  Musculoskeletal:        General: Normal range of motion.     Cervical back: Neck supple.  Lymphadenopathy:     Cervical: No cervical adenopathy.  Skin:    General: Skin is warm and dry.     Findings: No rash.  Neurological:     Mental Status: He is alert and oriented to person, place, and time.     Coordination: Coordination normal.  Psychiatric:        Behavior: Behavior normal.       Assessment & Plan:   Problem List Items Addressed This Visit      Cardiovascular and Mediastinum   Hypertension associated with diabetes ([place])   Relevant Orders   CMP14+EGFR     Endocrine   DM (diabetes mellitus) ([place]) - Primary   Relevant Orders   Microalbumin / creatinine urine ratio   Bayer DCA Hb A1c Waived   CMP14+EGFR     Other   HLD (hyperlipidemia)      Continue current medication, no changes, patient is doing well. Follow up plan: Return in about 3 months (around 04/05/2020), or if symptoms worsen or fail to improve, for Diabetes and hypertension and hyperlipidemia.  Counseling provided for all of the vaccine components Orders Placed This Encounter  Procedures  . Microalbumin / creatinine urine ratio  . Bayer [facility] Hb A1c Reedy, MD [facility] 01/04/2020, 10:59 AM

## 2020-01-04 NOTE — Progress Notes (Signed)
Cardiology Office Note    Date:  01/05/2020   ID:  Charles Castro, DOB 30-Oct-1945, MRN [id number]  PCP:  Dettinger, Fransisca Kaufmann, MD  Cardiologist: Jenkins Rouge, MD    No chief complaint on file.   History of Present Illness:    Charles Castro is a 74 y.o. male with past medical history of CAD (s/p STEMI in 05/2016 with DES to mid-RCA), HTN, HLD, GERD, Type 2 DM, and tobacco use who presents to the office today for follow-up.  Admitted to [facility] from 04/26/2018 to 04/27/2018 for evaluation of chest discomfort. His pain was overall felt to be atypical for a cardiac etiology his symptoms started after he consumed a taco  Cyclic troponin values remained negative and his EKG showed no acute ischemic changes. An echocardiogram was performed and showed a preserved EF of 60% with no regional wall motion abnormalities. No changes were made to his medication regimen and he was discharged home.  No angina on plavix  Labs reviewed LDL 36 10/02/19    Active planting flowers and vegetables but deer have gotten some  Compliant with meds not angina Still smoking and uses inhaler some    Past Medical History:  Diagnosis Date  . Anemia   . Colon polyp   . COPD (chronic obstructive pulmonary disease) ([place])   . Diverticulosis   . DM type 2 with diabetic dyslipidemia ([place])   . HTN (hypertension)   . MI (myocardial infarction) ([place]) 05/28/2016   DES to RCA  . Nicotine dependence   . STEMI (ST elevation myocardial infarction) ([place])   . Type 2 diabetes mellitus ([place])     Past Surgical History:  Procedure Laterality Date  . CARDIAC CATHETERIZATION N/A 05/28/2016   Procedure: Left Heart Cath and Coronary Angiography;  Surgeon: Nelva Bush, MD;  Location: [place] [facility];  Service: Cardiovascular;  Laterality: N/A;  . CARDIAC CATHETERIZATION N/A 05/28/2016   Procedure: Coronary Stent Intervention;  Surgeon: Nelva Bush, MD;  Location: [place] [facility];  Service: Cardiovascular;   Laterality: N/A;    Current Medications: Outpatient Medications Prior to Visit  Medication Sig Dispense Refill  . acetaminophen (TYLENOL) 325 MG tablet Take 650 mg by mouth as needed.    Marland Kitchen aspirin EC 81 MG EC tablet Take 1 tablet (81 mg total) by mouth daily. 30 tablet 11  . atorvastatin (LIPITOR) 80 MG tablet Take 1 tablet (80 mg total) by mouth daily. 90 tablet 3  . budesonide-formoterol (SYMBICORT) 160-4.5 MCG/ACT inhaler Inhale 2 puffs into the lungs daily as needed. 1 Inhaler 5  . clopidogrel (PLAVIX) 75 MG tablet Take 1 tablet (75 mg total) by mouth daily. 90 tablet 3  . glipiZIDE (GLUCOTROL XL) 5 MG 24 hr tablet Take 1 tablet (5 mg total) by mouth daily. 90 tablet 3  . lisinopril (ZESTRIL) 2.5 MG tablet Take 1 tablet (2.5 mg total) by mouth daily. 90 tablet 3  . metFORMIN (GLUCOPHAGE-XR) 500 MG 24 hr tablet Take 1 tablet (500 mg total) by mouth See admin instructions. 1 tablet with breakfast & 2 tablets with supper 90 tablet 3  . nitroGLYCERIN (NITROSTAT) 0.4 MG SL tablet Place 1 tablet (0.4 mg total) under the tongue every 5 (five) minutes x 3 doses as needed for chest pain. 25 tablet 3  . pantoprazole (PROTONIX) 20 MG tablet Take 1 tablet (20 mg total) by mouth daily. 90 tablet 3   No facility-administered medications prior to visit.     Allergies:  Patient has no known allergies.   Social History   Socioeconomic History  . Marital status: Widowed    Spouse name: Not on file  . Number of children: 1  . Years of education: Not on file  . Highest education level: High school graduate  Occupational History  . Occupation: Retired    Comment: 2008  Tobacco Use  . Smoking status: Former Smoker    Packs/day: 0.50    Years: 30.00    Pack years: 15.00    Types: Cigarettes    Start date: 05/15/1968    Quit date: 06/10/2019    Years since quitting: 0.5  . Smokeless tobacco: Never Used  Vaping Use  . Vaping Use: Never used  Substance and Sexual Activity  . Alcohol use: Not  Currently  . Drug use: No  . Sexual activity: Not Currently    Birth control/protection: None  Other Topics Concern  . Not on file  Social History Narrative  . Not on file   Social Determinants of Health   Financial Resource Strain: Low Risk   . Difficulty of Paying Living Expenses: Not hard at all  Food Insecurity: No Food Insecurity  . Worried About Charity fundraiser in the Last Year: Never true  . Ran Out of Food in the Last Year: Never true  Transportation Needs: No Transportation Needs  . Lack of Transportation (Medical): No  . Lack of Transportation (Non-Medical): No  Physical Activity: Insufficiently Active  . Days of Exercise per Week: 4 days  . Minutes of Exercise per Session: 30 min  Stress: No Stress Concern Present  . Feeling of Stress : Not at all  Social Connections: Moderately Integrated  . Frequency of Communication with Friends and Family: More than three times a week  . Frequency of Social Gatherings with Friends and Family: More than three times a week  . Attends Religious Services: More than 4 times per year  . Active Member of Clubs or Organizations: Yes  . Attends Archivist Meetings: More than 4 times per year  . Marital Status: Widowed     Family History:  The patient's family history includes CAD in his brother and father; Colon cancer in his mother; Diabetes in his brother, father, and sister.   Review of Systems:   Please see the history of present illness.     General:  No chills, fever, night sweats or weight changes.  Cardiovascular:  No chest pain, dyspnea on exertion, edema, orthopnea, palpitations, paroxysmal nocturnal dyspnea. Positive for chest pain (now resolved).  Dermatological: No rash, lesions/masses Respiratory: No cough, dyspnea Urologic: No hematuria, dysuria Abdominal:   No nausea, vomiting, diarrhea, bright red blood per rectum, melena, or hematemesis Neurologic:  No visual changes, wkns, changes in mental  status. All other systems reviewed and are otherwise negative except as noted above.   Physical Exam:    VS:  BP 120/70   Pulse 78   Temp (!) 97 F (36.1 C)   Ht 5\' 11"  (1.803 m)   Wt 164 lb 3.2 oz (74.5 kg)   SpO2 90%   BMI 22.90 kg/m    Affect appropriate Healthy:  appears stated age HEENT: normal Neck supple with no adenopathy JVP normal no bruits no thyromegaly Lungs bilateral exp  wheezing and good diaphragmatic motion Heart:  S1/S2 no murmur, no rub, gallop or click PMI normal Abdomen: benighn, BS positve, no tenderness, no AAA no bruit.  No HSM or HJR Distal pulses intact  with no bruits No edema Neuro non-focal Skin warm and dry No muscular weakness   Wt Readings from Last 3 Encounters:  01/05/20 164 lb 3.2 oz (74.5 kg)  01/04/20 162 lb (73.5 kg)  10/02/19 172 lb (78 kg)     Studies/Labs Reviewed:   EKG:  SR RBBB no acute changes 01/05/20  Recent Labs: 01/04/2020: ALT 13; BUN 16; Creatinine, Ser 0.89; Potassium 4.5; Sodium 139   Lipid Panel    Component Value Date/Time   CHOL 85 (L) 10/02/2019 1342   TRIG 60 10/02/2019 1342   HDL 35 (L) 10/02/2019 1342   CHOLHDL 2.4 10/02/2019 1342   CHOLHDL 2.0 06/08/2016 0827   VLDL 11 06/08/2016 0827   LDLCALC 36 10/02/2019 1342    Additional studies/ records that were reviewed today include:   Cardiac Catheterization: 05/2016 Conclusions: 1. Significant one vessel coronary artery disease with 60% proximal and 100% mid RCA stenoses. 2. Mild to moderate, nonobstructive CAD involving the LMCA, LAD, LCx, and distal RCA. 3. Normal left ventricular filling pressure. 4. Successful PCI to proximal and mid RCA with placement of a Promus Premier 3.0 x 38 mm drug-eluting stent (post dilated to 3.5 mm) with 0% residual stenosis and TIMI-3 flow.  Recommendations: 1. Dual antiplatelet therapy with aspirin and ticagrelor for at least 12 months. 2. Aggressive secondary prevention. 3. Obtain transthoracic echocardiogram  in AM for evaluation of LV function. 4. Remove right femoral vein sheath when ACT < 150 seconds.    Echocardiogram: 04/26/2018 Study Conclusions  - Left ventricle: The cavity size was normal. Wall thickness was   increased in a pattern of mild LVH. Systolic function was normal.   The estimated ejection fraction was 60%. Wall motion was normal;   there were no regional wall motion abnormalities. Left   ventricular diastolic function parameters were normal. - Aortic valve: Trileaflet; mildly thickened leaflets.   Plan:   In order of problems listed above:  1. CAD - s/p STEMI in 05/2016 with DES to mid-RCA. Admitted for chest pain 04/26/18  felt to be atypical for a cardiac etiology and ruled-out for ACS. Echo showed a preserved EF of 60% with no regional wall motion abnormalities. On plavix  No angina and active continue medical Rx   2. HTN - Well controlled.  Continue current medications and low sodium Dash type diet.     3. HLD - followed by PCP. Goal LDL is < 70 with known CAD. Remains on Atorvastatin 80mg  daily. LDL 36 normal LFTls   4. Tobacco Use - he continues to smoke 0.5 ppd. Cessation advised. No intention of quitting at this time. Lung cancer screening CT ordered   5. Bronchitis/Smoking:  Lung cancer screening CT ordered counseled on cessation < 10 minutes    Lung cancer screening CT smoker   F/u in a year if tests ok    Jenkins Rouge

## 2020-01-05 ENCOUNTER — Ambulatory Visit: Payer: Medicare HMO | Admitting: Cardiovascular Disease

## 2020-01-05 ENCOUNTER — Encounter: Payer: Self-pay | Admitting: Cardiovascular Disease

## 2020-01-05 VITALS — BP 120/70 | HR 78 | Temp 97.0°F | Ht 71.0 in | Wt 164.2 lb

## 2020-01-05 DIAGNOSIS — I10 Essential (primary) hypertension: Secondary | ICD-10-CM

## 2020-01-05 DIAGNOSIS — Z720 Tobacco use: Secondary | ICD-10-CM | POA: Diagnosis not present

## 2020-01-05 LAB — CMP14+EGFR
ALT: 13 IU/L (ref 0–44)
AST: 19 IU/L (ref 0–40)
Albumin/Globulin Ratio: 1.6 (ref 1.2–2.2)
Albumin: 4.8 g/dL — ABNORMAL HIGH (ref 3.7–4.7)
Alkaline Phosphatase: 110 IU/L (ref 48–121)
BUN/Creatinine Ratio: 18 (ref 10–24)
BUN: 16 mg/dL (ref 8–27)
Bilirubin Total: 0.3 mg/dL (ref 0.0–1.2)
CO2: 19 mmol/L — ABNORMAL LOW (ref 20–29)
Calcium: 10.3 mg/dL — ABNORMAL HIGH (ref 8.6–10.2)
Chloride: 103 mmol/L (ref 96–106)
Creatinine, Ser: 0.89 mg/dL (ref 0.76–1.27)
GFR calc Af Amer: 99 mL/min/{1.73_m2} (ref >59)
GFR calc non Af Amer: 86 mL/min/{1.73_m2} (ref >59)
Globulin, Total: 3 g/dL (ref 1.5–4.5)
Glucose: 95 mg/dL (ref 65–99)
Potassium: 4.5 mmol/L (ref 3.5–5.2)
Sodium: 139 mmol/L (ref 134–144)
Total Protein: 7.8 g/dL (ref 6.0–8.5)

## 2020-01-05 LAB — MICROALBUMIN / CREATININE URINE RATIO
Creatinine, Urine: 40.5 mg/dL
Microalb/Creat Ratio: 17 mg/g creat (ref 0–29)
Microalbumin, Urine: 6.9 ug/mL

## 2020-01-05 NOTE — Patient Instructions (Signed)
Medication Instructions:  Your physician recommends that you continue on your current medications as directed. Please refer to the Current Medication list given to you today.  *If you need a refill on your cardiac medications before your next appointment, please call your pharmacy*   Lab Work: None   If you have labs (blood work) drawn today and your tests are completely normal, you will receive your results only by: Marland Kitchen MyChart Message (if you have MyChart) OR . A paper copy in the mail If you have any lab test that is abnormal or we need to change your treatment, we will call you to review the results.   Testing/Procedures: NONE   Follow-Up: At [facility], you and your health needs are our priority.  As part of our continuing mission to provide you with exceptional heart care, we have created designated Provider Care Teams.  These Care Teams include your primary Cardiologist (physician) and Advanced Practice Providers (APPs -  Physician Assistants and Nurse Practitioners) who all work together to provide you with the care you need, when you need it.  We recommend signing up for the patient portal called "MyChart".  Sign up information is provided on this After Visit Summary.  MyChart is used to connect with patients for Virtual Visits (Telemedicine).  Patients are able to view lab/test results, encounter notes, upcoming appointments, etc.  Non-urgent messages can be sent to your provider as well.   To learn more about what you can do with MyChart, go to [email or web address].    Your next appointment:   1 year(s)  The format for your next appointment:   In Person  Provider:   Jenkins Rouge, MD   Other Instructions Thank you for choosing [facility]!

## 2020-01-08 ENCOUNTER — Telehealth: Payer: Self-pay | Admitting: Family Medicine

## 2020-01-08 NOTE — Telephone Encounter (Signed)
Yes if it is a CT scan that he has no issue, go ahead and do the CT scan, it is unlikely that the cardiologist would have ordered an MRI for this anyways because the CT scan is sufficient for the lungs telling him he is okay to get a CT scan because it is not magnetic like an MRI.

## 2020-01-08 NOTE — Telephone Encounter (Signed)
Son aware ok for patient to have CT scan

## 2020-01-08 NOTE — Telephone Encounter (Signed)
Advised patients son that cardiologist ordered a CT scan for lung cancer screening due to tobacco use and bronchitis.  Patient had CT scan in 2011 of the abdomen.  Patient should not have any issue with CT scan due to metal in stomach.

## 2020-01-09 ENCOUNTER — Ambulatory Visit: Payer: Medicare HMO | Admitting: Cardiovascular Disease

## 2020-01-16 ENCOUNTER — Telehealth: Payer: Self-pay | Admitting: Family Medicine

## 2020-01-16 MED ORDER — ONETOUCH ULTRA VI STRP: ORAL_STRIP | 12 refills | Status: DC

## 2020-01-16 NOTE — Telephone Encounter (Signed)
Rx sent to pharmacy   

## 2020-01-16 NOTE — Telephone Encounter (Signed)
°  Prescription Request  01/16/2020  What is the name of the medication or equipment? Diabetic test strips--ultra one touch  Have you contacted your pharmacy to request a refill? (if applicable) no he was here two weeks ago for checkup apt  Which pharmacy would you like this sent to? [facility]    Patient notified that their request is being sent to the clinical staff for review and that they should receive a response within 2 business days.

## 2020-01-17 DIAGNOSIS — R69 Illness, unspecified: Secondary | ICD-10-CM | POA: Diagnosis not present

## 2020-01-22 LAB — HM DIABETES EYE EXAM

## 2020-01-24 ENCOUNTER — Other Ambulatory Visit: Payer: Self-pay

## 2020-01-24 ENCOUNTER — Ambulatory Visit (HOSPITAL_COMMUNITY)
Admission: RE | Admit: 2020-01-24 | Discharge: 2020-01-24 | Disposition: A | Discharge status: Home / Self Care | Payer: Medicare HMO | Source: Ambulatory Visit | Attending: Cardiovascular Disease | Admitting: Cardiovascular Disease

## 2020-01-24 DIAGNOSIS — Z122 Encounter for screening for malignant neoplasm of respiratory organs: Secondary | ICD-10-CM | POA: Insufficient documentation

## 2020-01-24 DIAGNOSIS — F17210 Nicotine dependence, cigarettes, uncomplicated: Secondary | ICD-10-CM | POA: Diagnosis not present

## 2020-01-24 DIAGNOSIS — I700 Atherosclerosis of aorta: Secondary | ICD-10-CM | POA: Diagnosis not present

## 2020-01-24 DIAGNOSIS — J439 Emphysema, unspecified: Secondary | ICD-10-CM | POA: Insufficient documentation

## 2020-01-24 DIAGNOSIS — Z87891 Personal history of nicotine dependence: Secondary | ICD-10-CM | POA: Diagnosis not present

## 2020-01-24 DIAGNOSIS — Z720 Tobacco use: Secondary | ICD-10-CM

## 2020-01-24 DIAGNOSIS — R69 Illness, unspecified: Secondary | ICD-10-CM | POA: Diagnosis not present

## 2020-01-26 ENCOUNTER — Telehealth: Payer: Self-pay | Admitting: Cardiovascular Disease

## 2020-01-26 NOTE — Telephone Encounter (Signed)
Calling for CT results

## 2020-01-26 NOTE — Telephone Encounter (Signed)
Chest CT as pt is smoker.  Done yesterday, not resulted yet, will forward to [place]

## 2020-01-30 NOTE — Telephone Encounter (Signed)
Josue Hector, MD  01/27/2020 11:27 AM EDT     No lung cancer emyphysema f/u lung cancer CT In a year    Lm for pt to call back

## 2020-01-31 NOTE — Telephone Encounter (Signed)
Gave results to son, Edd Arbour

## 2020-02-11 DIAGNOSIS — R69 Illness, unspecified: Secondary | ICD-10-CM | POA: Diagnosis not present

## 2020-03-10 DIAGNOSIS — R69 Illness, unspecified: Secondary | ICD-10-CM | POA: Diagnosis not present

## 2020-03-21 IMAGING — DX DG CHEST 2V
2 series · 2 of 2 positions shown · non-contrast
Comparison: 04/26/2018

CLINICAL DATA: Cough, COPD, smoker, question pneumonia

EXAM:
CHEST - 2 VIEW

[chest pa]
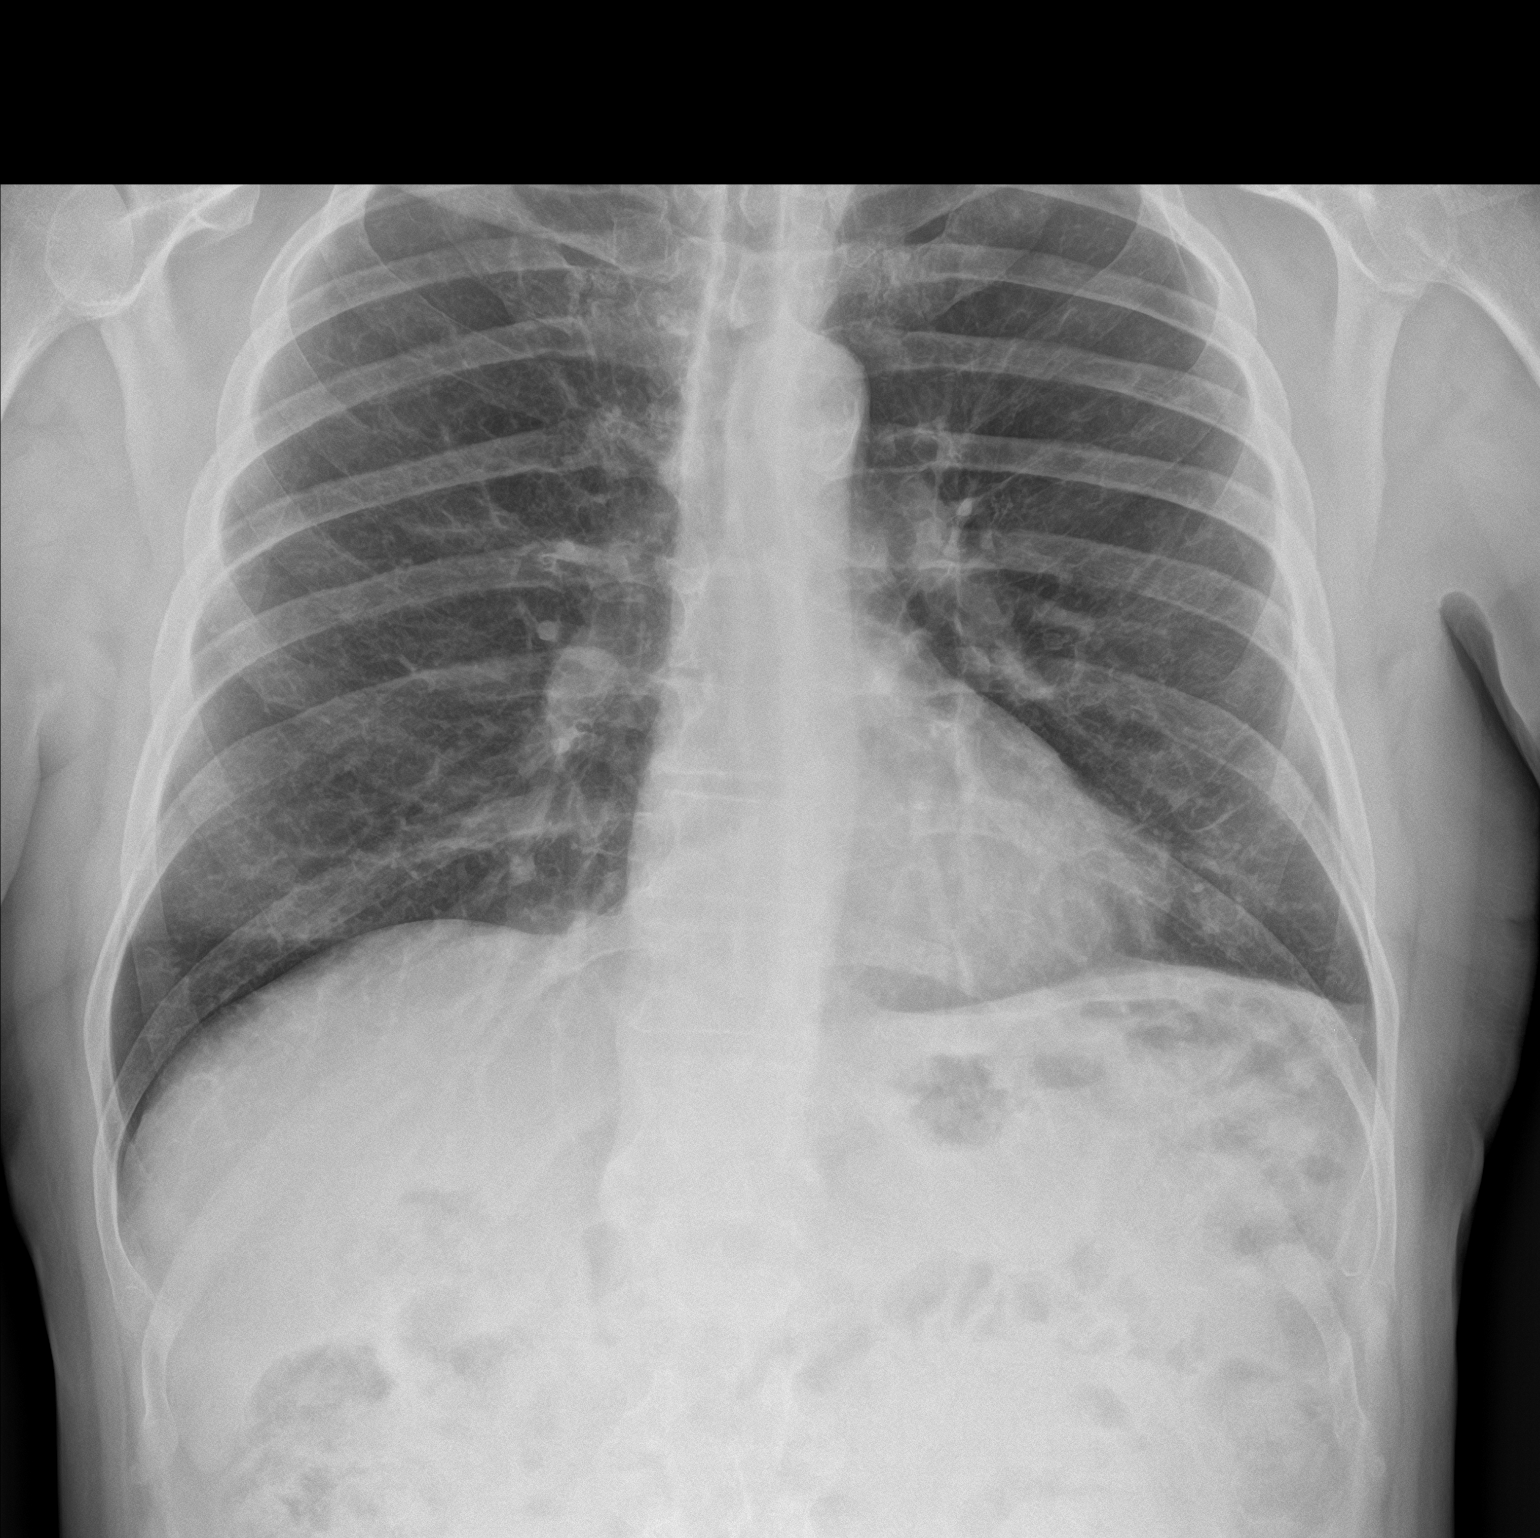

[chest lat]
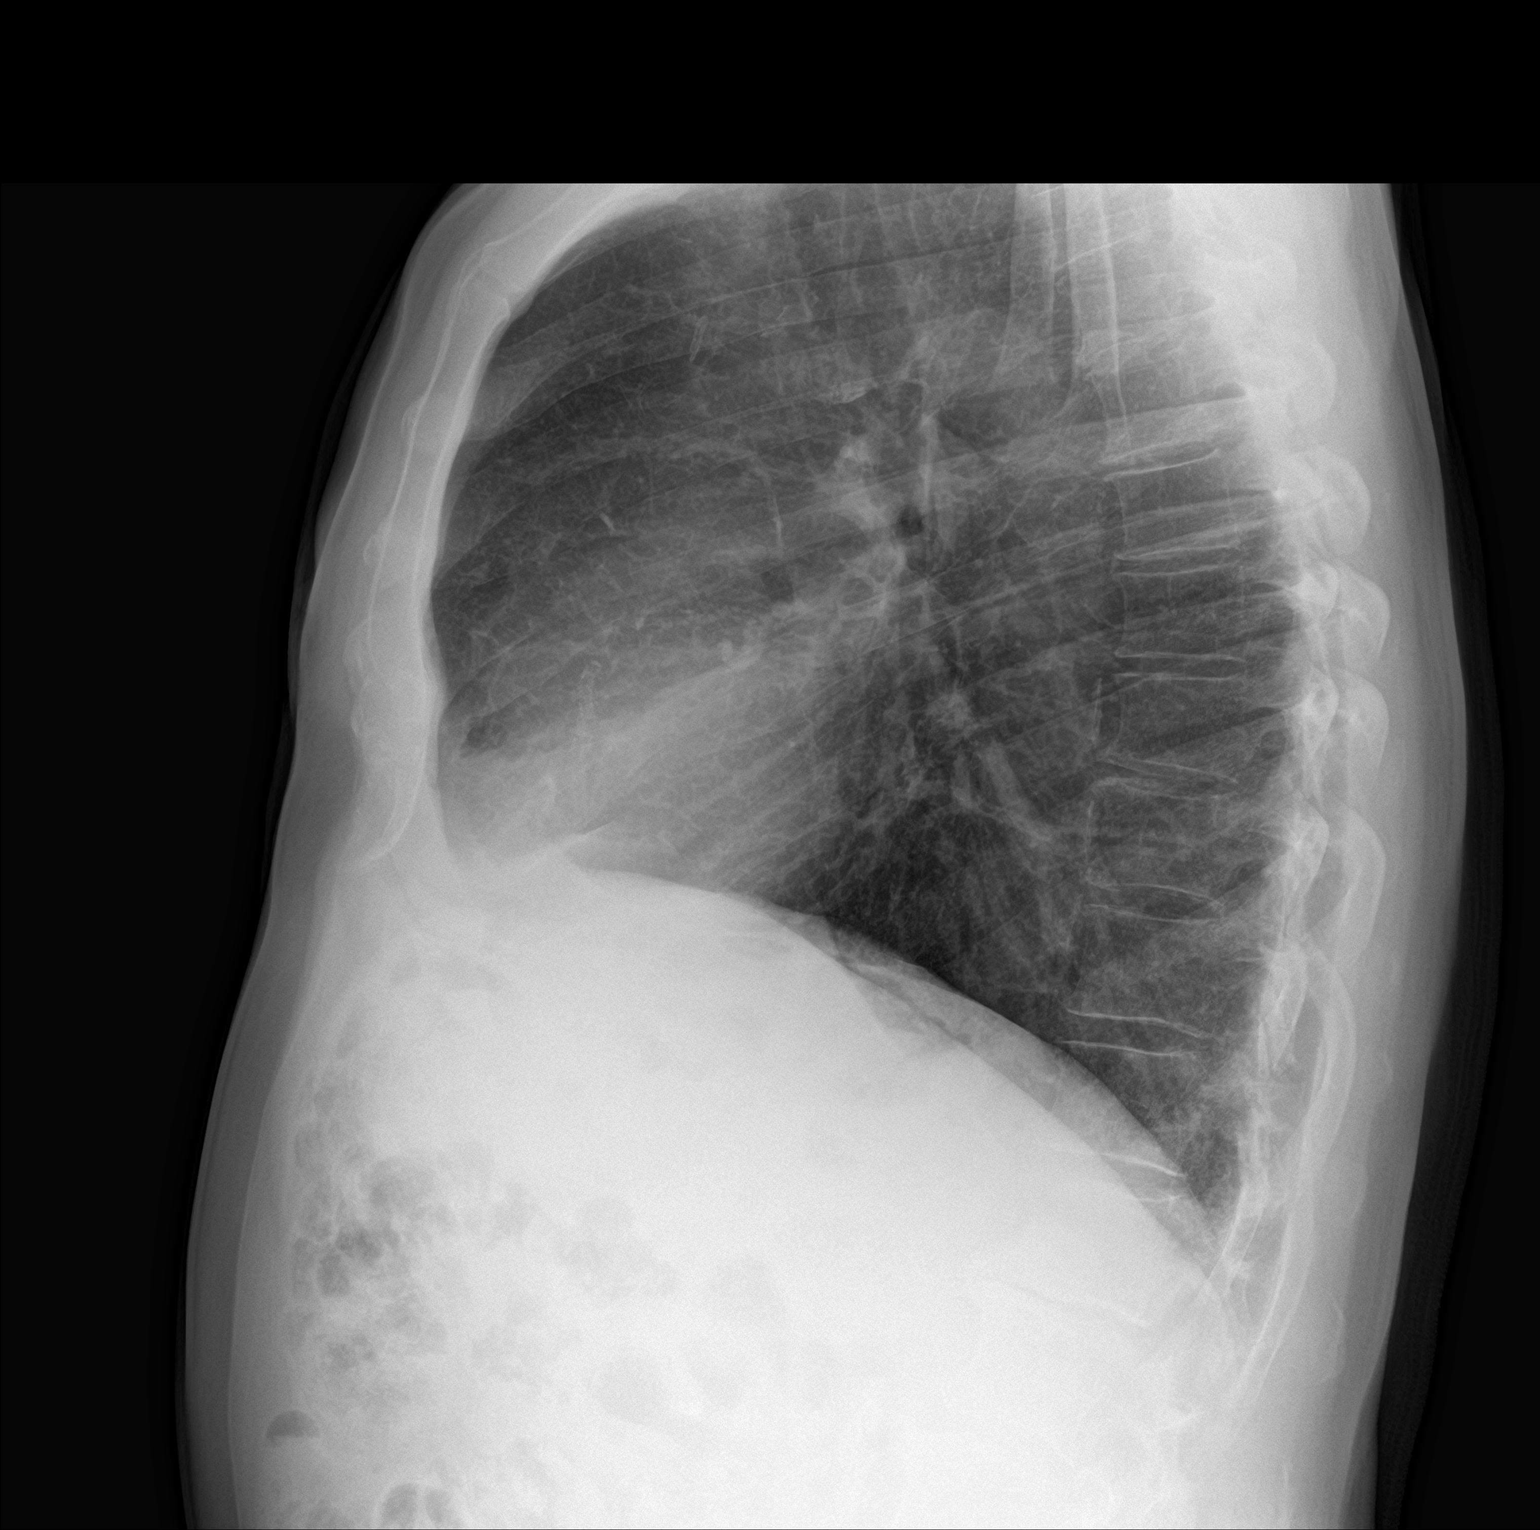

[2 of 2 positions shown; findings below may reference images not displayed]

FINDINGS: Normal heart size, mediastinal contours, and pulmonary vascularity.

Chronic bronchitic changes.

No acute infiltrate, pleural effusion or pneumothorax.

Bones unremarkable.
IMPRESSION: Chronic bronchitic changes without infiltrate.

## 2020-04-07 DIAGNOSIS — R69 Illness, unspecified: Secondary | ICD-10-CM | POA: Diagnosis not present

## 2020-05-07 DIAGNOSIS — R69 Illness, unspecified: Secondary | ICD-10-CM | POA: Diagnosis not present

## 2020-05-08 ENCOUNTER — Encounter: Payer: Self-pay | Admitting: Family Medicine

## 2020-05-08 ENCOUNTER — Ambulatory Visit (INDEPENDENT_AMBULATORY_CARE_PROVIDER_SITE_OTHER): Payer: Medicare HMO | Admitting: Family Medicine

## 2020-05-08 ENCOUNTER — Other Ambulatory Visit: Payer: Self-pay

## 2020-05-08 VITALS — BP 142/58 | HR 63 | Temp 97.7°F | Ht 71.0 in | Wt 166.0 lb

## 2020-05-08 DIAGNOSIS — E1159 Type 2 diabetes mellitus with other circulatory complications: Secondary | ICD-10-CM | POA: Diagnosis not present

## 2020-05-08 DIAGNOSIS — Z23 Encounter for immunization: Secondary | ICD-10-CM | POA: Diagnosis not present

## 2020-05-08 DIAGNOSIS — E782 Mixed hyperlipidemia: Secondary | ICD-10-CM | POA: Diagnosis not present

## 2020-05-08 DIAGNOSIS — E1169 Type 2 diabetes mellitus with other specified complication: Secondary | ICD-10-CM

## 2020-05-08 DIAGNOSIS — J439 Emphysema, unspecified: Secondary | ICD-10-CM

## 2020-05-08 DIAGNOSIS — I152 Hypertension secondary to endocrine disorders: Secondary | ICD-10-CM | POA: Diagnosis not present

## 2020-05-08 LAB — BAYER DCA HB A1C WAIVED: HB A1C (BAYER DCA - WAIVED): 6.1 % (ref <7.0)

## 2020-05-08 MED ORDER — ATORVASTATIN CALCIUM 80 MG PO TABS: 80.0000 mg | ORAL_TABLET | Freq: Every day | ORAL | 3 refills | Status: DC

## 2020-05-08 MED ORDER — METFORMIN HCL ER 500 MG PO TB24: 500.0000 mg | ORAL_TABLET | Freq: Two times a day (BID) | ORAL | 3 refills | Status: DC

## 2020-05-08 MED ORDER — BUDESONIDE-FORMOTEROL FUMARATE 160-4.5 MCG/ACT IN AERO: 2.0000 | INHALATION_SPRAY | Freq: Every day | RESPIRATORY_TRACT | 11 refills | Status: DC | PRN

## 2020-05-08 MED ORDER — PANTOPRAZOLE SODIUM 20 MG PO TBEC
20.0000 mg | DELAYED_RELEASE_TABLET | Freq: Every day | ORAL | 3 refills | Status: DC
Start: 2020-05-08 — End: 2021-06-05

## 2020-05-08 MED ORDER — CLOPIDOGREL BISULFATE 75 MG PO TABS: 75.0000 mg | ORAL_TABLET | Freq: Every day | ORAL | 3 refills | Status: DC

## 2020-05-08 MED ORDER — GLIPIZIDE ER 5 MG PO TB24: 5.0000 mg | ORAL_TABLET | Freq: Every day | ORAL | 3 refills | Status: DC

## 2020-05-08 MED ORDER — LISINOPRIL 2.5 MG PO TABS: 2.5000 mg | ORAL_TABLET | Freq: Every day | ORAL | 3 refills | Status: DC

## 2020-05-08 NOTE — Progress Notes (Signed)
BP (!) 142/58   Pulse 63   Temp 97.7 F (36.5 C)   Ht 5' 11" (1.803 m)   Wt 166 lb (75.3 kg)   SpO2 94%   BMI 23.15 kg/m    Subjective:   Patient ID: Charles Castro, male    DOB: 09-23-1945, 74 y.o.   MRN: [id number]  HPI: Charles Castro is a 74 y.o. male presenting on 05/08/2020 for Medical Management of Chronic Issues and Diabetes   HPI Type 2 diabetes mellitus Patient comes in today for recheck of his diabetes. Patient has been currently taking Metformin and glipizide. Patient is currently on an ACE inhibitor/ARB. Patient has seen an ophthalmologist this year. Patient denies any issues with their feet. The symptom started onset as an adult glipizide and Metformin ARE RELATED TO DM   Hyperlipidemia Patient is coming in for recheck of his hyperlipidemia. The patient is currently taking atorvastatin and fish oil. They deny any issues with myalgias or history of liver damage from it. They deny any focal numbness or weakness or chest pain.   Hypertension Patient is currently on lisinopril, and their blood pressure today is 142/58. Patient denies any lightheadedness or dizziness. Patient denies headaches, blurred vision, chest pains, shortness of breath, or weakness. Denies any side effects from medication and is content with current medication.   COPD Patient is coming in for COPD recheck today.  He is currently on Symbicort.  He has a mild chronic cough but denies any major coughing spells or wheezing spells.  He has 2nighttime symptoms per week and 2daytime symptoms per week currently.   Relevant past medical, surgical, family and social history reviewed and updated as indicated. Interim medical history since our last visit reviewed. Allergies and medications reviewed and updated.  Review of Systems  Constitutional: Negative for chills and fever.  Eyes: Negative for visual disturbance.  Respiratory: Negative for shortness of breath and wheezing.   Cardiovascular: Negative for  chest pain and leg swelling.  Musculoskeletal: Negative for back pain and gait problem.  Skin: Negative for rash.  Neurological: Negative for dizziness, weakness and light-headedness.  All other systems reviewed and are negative.   Per HPI unless specifically indicated above   Allergies as of 05/08/2020   No Known Allergies     Medication List       Accurate as of May 08, 2020 11:46 AM. If you have any questions, ask your nurse or doctor.        acetaminophen 325 MG tablet Commonly known as: TYLENOL Take 650 mg by mouth as needed.   aspirin 81 MG EC tablet Take 1 tablet (81 mg total) by mouth daily.   atorvastatin 80 MG tablet Commonly known as: LIPITOR Take 1 tablet (80 mg total) by mouth daily.   budesonide-formoterol 160-4.5 MCG/ACT inhaler Commonly known as: SYMBICORT Inhale 2 puffs into the lungs daily as needed.   clopidogrel 75 MG tablet Commonly known as: PLAVIX Take 1 tablet (75 mg total) by mouth daily.   Fish Oil 1000 MG Caps Take by mouth.   glipiZIDE 5 MG 24 hr tablet Commonly known as: GLUCOTROL XL Take 1 tablet (5 mg total) by mouth daily.   lisinopril 2.5 MG tablet Commonly known as: ZESTRIL Take 1 tablet (2.5 mg total) by mouth daily.   metFORMIN 500 MG 24 hr tablet Commonly known as: GLUCOPHAGE-XR Take 1 tablet (500 mg total) by mouth in the morning and at bedtime. What changed:   when to take  this  additional instructions Changed by: Joshua A Dettinger, MD   nitroGLYCERIN 0.4 MG SL tablet Commonly known as: NITROSTAT Place 1 tablet (0.4 mg total) under the tongue every 5 (five) minutes x 3 doses as needed for chest pain.   OneTouch Ultra test strip Generic drug: glucose blood Use as instructed   pantoprazole 20 MG tablet Commonly known as: PROTONIX Take 1 tablet (20 mg total) by mouth daily.        Objective:   BP (!) 142/58   Pulse 63   Temp 97.7 F (36.5 C)   Ht 5' 11" (1.803 m)   Wt 166 lb (75.3 kg)   SpO2  94%   BMI 23.15 kg/m   Wt Readings from Last 3 Encounters:  05/08/20 166 lb (75.3 kg)  01/05/20 164 lb 3.2 oz (74.5 kg)  01/04/20 162 lb (73.5 kg)    Physical Exam Vitals and nursing note reviewed.  Constitutional:      General: He is not in acute distress.    Appearance: He is well-developed. He is not diaphoretic.  Eyes:     General: No scleral icterus.    Conjunctiva/sclera: Conjunctivae normal.  Neck:     Thyroid: No thyromegaly.  Cardiovascular:     Rate and Rhythm: Normal rate and regular rhythm.     Heart sounds: Normal heart sounds. No murmur heard.   Pulmonary:     Effort: Pulmonary effort is normal. No respiratory distress.     Breath sounds: Normal breath sounds. No wheezing.  Musculoskeletal:        General: Normal range of motion.     Cervical back: Neck supple.  Lymphadenopathy:     Cervical: No cervical adenopathy.  Skin:    General: Skin is warm and dry.     Findings: No rash.  Neurological:     Mental Status: He is alert and oriented to person, place, and time.     Coordination: Coordination normal.  Psychiatric:        Behavior: Behavior normal.     Results for orders placed or performed in visit on 01/24/20  HM DIABETES EYE EXAM  Result Value Ref Range   HM Diabetic Eye Exam No Retinopathy No Retinopathy    Assessment & Plan:   Problem List Items Addressed This Visit      Cardiovascular and Mediastinum   Hypertension associated with diabetes (HCC)   Relevant Medications   atorvastatin (LIPITOR) 80 MG tablet   clopidogrel (PLAVIX) 75 MG tablet   glipiZIDE (GLUCOTROL XL) 5 MG 24 hr tablet   lisinopril (ZESTRIL) 2.5 MG tablet   metFORMIN (GLUCOPHAGE-XR) 500 MG 24 hr tablet   Other Relevant Orders   CBC with Differential/Platelet   CMP14+EGFR     Respiratory   COPD (chronic obstructive pulmonary disease) (HCC)   Relevant Medications   budesonide-formoterol (SYMBICORT) 160-4.5 MCG/ACT inhaler   Other Relevant Orders   CBC with  Differential/Platelet     Endocrine   DM (diabetes mellitus) (HCC) - Primary   Relevant Medications   atorvastatin (LIPITOR) 80 MG tablet   glipiZIDE (GLUCOTROL XL) 5 MG 24 hr tablet   lisinopril (ZESTRIL) 2.5 MG tablet   metFORMIN (GLUCOPHAGE-XR) 500 MG 24 hr tablet   Other Relevant Orders   Bayer DCA Hb A1c Waived   CBC with Differential/Platelet     Other   HLD (hyperlipidemia)   Relevant Medications   atorvastatin (LIPITOR) 80 MG tablet   lisinopril (ZESTRIL) 2.5 MG tablet     Other Relevant Orders   Lipid panel    Other Visit Diagnoses    Flu vaccine need       Relevant Orders   Flu Vaccine QUAD High Dose(Fluad) (Completed)      Patient's hemoglobin A1c is 6.1, no change in medication, continue currently. Follow up plan: Return in about 3 months (around 08/08/2020), or if symptoms worsen or fail to improve, for Diabetes and hypertension.  Counseling provided for all of the vaccine components Orders Placed This Encounter  Procedures  . Flu Vaccine QUAD High Dose(Fluad)  . Bayer DCA Hb A1c Waived  . CBC with Differential/Platelet  . CMP14+EGFR  . Lipid panel    Caryl Pina, MD [facility] 05/08/2020, 11:46 AM

## 2020-05-09 LAB — LIPID PANEL
Chol/HDL Ratio: 2.6 ratio (ref 0.0–5.0)
Cholesterol, Total: 90 mg/dL — ABNORMAL LOW (ref 100–199)
HDL: 35 mg/dL — ABNORMAL LOW (ref >39)
LDL Chol Calc (NIH): 38 mg/dL (ref 0–99)
Triglycerides: 84 mg/dL (ref 0–149)
VLDL Cholesterol Cal: 17 mg/dL (ref 5–40)

## 2020-05-09 LAB — CMP14+EGFR
ALT: 11 IU/L (ref 0–44)
AST: 14 IU/L (ref 0–40)
Albumin/Globulin Ratio: 2 (ref 1.2–2.2)
Albumin: 4.4 g/dL (ref 3.7–4.7)
Alkaline Phosphatase: 84 IU/L (ref 44–121)
BUN/Creatinine Ratio: 17 (ref 10–24)
BUN: 12 mg/dL (ref 8–27)
Bilirubin Total: 0.5 mg/dL (ref 0.0–1.2)
CO2: 25 mmol/L (ref 20–29)
Calcium: 9.6 mg/dL (ref 8.6–10.2)
Chloride: 103 mmol/L (ref 96–106)
Creatinine, Ser: 0.7 mg/dL — ABNORMAL LOW (ref 0.76–1.27)
GFR calc Af Amer: 110 mL/min/{1.73_m2} (ref >59)
GFR calc non Af Amer: 95 mL/min/{1.73_m2} (ref >59)
Globulin, Total: 2.2 g/dL (ref 1.5–4.5)
Glucose: 87 mg/dL (ref 65–99)
Potassium: 4.6 mmol/L (ref 3.5–5.2)
Sodium: 141 mmol/L (ref 134–144)
Total Protein: 6.6 g/dL (ref 6.0–8.5)

## 2020-05-09 LAB — CBC WITH DIFFERENTIAL/PLATELET
Basophils Absolute: 0.1 10*3/uL (ref 0.0–0.2)
Basos: 1 %
EOS (ABSOLUTE): 0.3 10*3/uL (ref 0.0–0.4)
Eos: 4 %
Hematocrit: 38.2 % (ref 37.5–51.0)
Hemoglobin: 12.1 g/dL — ABNORMAL LOW (ref 13.0–17.7)
Immature Grans (Abs): 0 10*3/uL (ref 0.0–0.1)
Immature Granulocytes: 0 %
Lymphocytes Absolute: 1.7 10*3/uL (ref 0.7–3.1)
Lymphs: 25 %
MCH: 27.3 pg (ref 26.6–33.0)
MCHC: 31.7 g/dL (ref 31.5–35.7)
MCV: 86 fL (ref 79–97)
Monocytes Absolute: 0.5 10*3/uL (ref 0.1–0.9)
Monocytes: 7 %
Neutrophils Absolute: 4.3 10*3/uL (ref 1.4–7.0)
Neutrophils: 63 %
Platelets: 278 10*3/uL (ref 150–450)
RBC: 4.43 x10E6/uL (ref 4.14–5.80)
RDW: 16.1 % — ABNORMAL HIGH (ref 11.6–15.4)
WBC: 6.9 10*3/uL (ref 3.4–10.8)

## 2020-06-06 DIAGNOSIS — R69 Illness, unspecified: Secondary | ICD-10-CM | POA: Diagnosis not present

## 2020-07-06 DIAGNOSIS — R69 Illness, unspecified: Secondary | ICD-10-CM | POA: Diagnosis not present

## 2020-08-07 ENCOUNTER — Other Ambulatory Visit: Payer: Self-pay | Admitting: Family Medicine

## 2020-08-07 DIAGNOSIS — E1159 Type 2 diabetes mellitus with other circulatory complications: Secondary | ICD-10-CM

## 2020-08-07 DIAGNOSIS — E782 Mixed hyperlipidemia: Secondary | ICD-10-CM

## 2020-08-08 ENCOUNTER — Encounter: Payer: Self-pay | Admitting: Family Medicine

## 2020-08-08 ENCOUNTER — Ambulatory Visit (INDEPENDENT_AMBULATORY_CARE_PROVIDER_SITE_OTHER): Payer: Medicare HMO | Admitting: Family Medicine

## 2020-08-08 ENCOUNTER — Other Ambulatory Visit: Payer: Self-pay

## 2020-08-08 VITALS — BP 139/84 | HR 88 | Ht 71.0 in | Wt 172.0 lb

## 2020-08-08 DIAGNOSIS — R7303 Prediabetes: Secondary | ICD-10-CM

## 2020-08-08 DIAGNOSIS — I152 Hypertension secondary to endocrine disorders: Secondary | ICD-10-CM

## 2020-08-08 DIAGNOSIS — E782 Mixed hyperlipidemia: Secondary | ICD-10-CM

## 2020-08-08 DIAGNOSIS — E1159 Type 2 diabetes mellitus with other circulatory complications: Secondary | ICD-10-CM

## 2020-08-08 DIAGNOSIS — E1169 Type 2 diabetes mellitus with other specified complication: Secondary | ICD-10-CM | POA: Insufficient documentation

## 2020-08-08 LAB — BAYER DCA HB A1C WAIVED: HB A1C (BAYER DCA - WAIVED): 6.7 % (ref <7.0)

## 2020-08-08 MED ORDER — CLOPIDOGREL BISULFATE 75 MG PO TABS: 75.0000 mg | ORAL_TABLET | Freq: Every day | ORAL | 3 refills | Status: DC

## 2020-08-08 MED ORDER — ATORVASTATIN CALCIUM 80 MG PO TABS: 80.0000 mg | ORAL_TABLET | Freq: Every day | ORAL | 3 refills | Status: DC

## 2020-08-08 NOTE — Progress Notes (Signed)
BP 139/84   Pulse 88   Ht 5\' 11"  (1.803 m)   Wt 172 lb (78 kg)   SpO2 97%   BMI 23.99 kg/m    Subjective:   Patient ID: Charles Castro, male    DOB: 06-08-1946, 75 y.o.   MRN: [id number]  HPI: Charles Castro is a 75 y.o. male presenting on 08/08/2020 for Medical Management of Chronic Issues and Diabetes   HPI Type 2 diabetes mellitus Patient comes in today for recheck of his diabetes. Patient has been currently taking glipizide and metformin. Patient is currently on an ACE inhibitor/ARB. Patient has seen an ophthalmologist this year. Patient denies any issues with their feet. The symptom started onset as an adult hypertension and hyperlipidemia ARE RELATED TO DM   Hypertension Patient is currently on lisinopril 2.5 mg, and their blood pressure today is 139/84. Patient denies any lightheadedness or dizziness. Patient denies headaches, blurred vision, chest pains, shortness of breath, or weakness. Denies any side effects from medication and is content with current medication.   Hyperlipidemia Patient is coming in for recheck of his hyperlipidemia. The patient is currently taking  Lipitor and fish oil. They deny any issues with myalgias or history of liver damage from it. They deny any focal numbness or weakness or chest pain.   Relevant past medical, surgical, family and social history reviewed and updated as indicated. Interim medical history since our last visit reviewed. Allergies and medications reviewed and updated.  Review of Systems  Constitutional: Negative for chills and fever.  Respiratory: Negative for shortness of breath and wheezing.   Cardiovascular: Negative for chest pain and leg swelling.  Musculoskeletal: Negative for back pain and gait problem.  Skin: Negative for rash.  All other systems reviewed and are negative.   Per HPI unless specifically indicated above   Allergies as of 08/08/2020   No Known Allergies     Medication List       Accurate as of  August 08, 2020  9:31 AM. If you have any questions, ask your nurse or doctor.        acetaminophen 325 MG tablet Commonly known as: TYLENOL Take 650 mg by mouth as needed.   aspirin 81 MG EC tablet Take 1 tablet (81 mg total) by mouth daily.   atorvastatin 80 MG tablet Commonly known as: LIPITOR Take 1 tablet by mouth once daily   budesonide-formoterol 160-4.5 MCG/ACT inhaler Commonly known as: SYMBICORT Inhale 2 puffs into the lungs daily as needed.   clopidogrel 75 MG tablet Commonly known as: PLAVIX Take 1 tablet by mouth once daily   Fish Oil 1000 MG Caps Take by mouth.   glipiZIDE 5 MG 24 hr tablet Commonly known as: GLUCOTROL XL Take 1 tablet (5 mg total) by mouth daily.   lisinopril 2.5 MG tablet Commonly known as: ZESTRIL Take 1 tablet (2.5 mg total) by mouth daily.   metFORMIN 500 MG 24 hr tablet Commonly known as: GLUCOPHAGE-XR Take 1 tablet (500 mg total) by mouth in the morning and at bedtime.   nitroGLYCERIN 0.4 MG SL tablet Commonly known as: NITROSTAT Place 1 tablet (0.4 mg total) under the tongue every 5 (five) minutes x 3 doses as needed for chest pain.   OneTouch Ultra test strip Generic drug: glucose blood Use as instructed   pantoprazole 20 MG tablet Commonly known as: PROTONIX Take 1 tablet (20 mg total) by mouth daily.        Objective:   BP  139/84   Pulse 88   Ht 5\' 11"  (1.803 m)   Wt 172 lb (78 kg)   SpO2 97%   BMI 23.99 kg/m   Wt Readings from Last 3 Encounters:  08/08/20 172 lb (78 kg)  05/08/20 166 lb (75.3 kg)  01/05/20 164 lb 3.2 oz (74.5 kg)    Physical Exam Vitals and nursing note reviewed.  Constitutional:      General: He is not in acute distress.    Appearance: He is well-developed and well-nourished. He is not diaphoretic.  Eyes:     General: No scleral icterus.    Extraocular Movements: EOM normal.     Conjunctiva/sclera: Conjunctivae normal.  Neck:     Thyroid: No thyromegaly.  Cardiovascular:      Rate and Rhythm: Normal rate and regular rhythm.     Pulses: Intact distal pulses.     Heart sounds: Normal heart sounds. No murmur heard.   Pulmonary:     Effort: Pulmonary effort is normal. No respiratory distress.     Breath sounds: Normal breath sounds. No wheezing.  Musculoskeletal:        General: No edema. Normal range of motion.     Cervical back: Neck supple.  Lymphadenopathy:     Cervical: No cervical adenopathy.  Skin:    General: Skin is warm and dry.     Findings: No rash.  Neurological:     Mental Status: He is alert and oriented to person, place, and time.     Coordination: Coordination normal.  Psychiatric:        Mood and Affect: Mood and affect normal.        Behavior: Behavior normal.       Assessment & Plan:   Problem List Items Addressed This Visit      Cardiovascular and Mediastinum   Hypertension associated with diabetes ([place])   Relevant Medications   atorvastatin (LIPITOR) 80 MG tablet   clopidogrel (PLAVIX) 75 MG tablet     Endocrine   DM (diabetes mellitus) (HCC) - Primary   Relevant Medications   atorvastatin (LIPITOR) 80 MG tablet   Other Relevant Orders   Bayer DCA Hb A1c Waived (Completed)   Type 2 diabetes mellitus with other specified complication (HCC)   Relevant Medications   atorvastatin (LIPITOR) 80 MG tablet   Other Relevant Orders   Bayer DCA Hb A1c Waived (Completed)     Other   HLD (hyperlipidemia)   Relevant Medications   atorvastatin (LIPITOR) 80 MG tablet      Continue current medication.  A1c looks good at 6.7 but up from previous discussed diet and exercise.  No change in medication but will monitor and refocus on diet for next time. Follow up plan: Return in about 3 months (around 11/06/2020), or if symptoms worsen or fail to improve, for Type 2 diabetes and hypertension and cholesterol.  Counseling provided for all of the vaccine components Orders Placed This Encounter  Procedures  . Bayer [facility] Hb A1c [place], MD [facility] 08/08/2020, 9:31 AM

## 2020-08-12 ENCOUNTER — Ambulatory Visit: Payer: Medicare HMO

## 2020-10-07 ENCOUNTER — Telehealth: Payer: Self-pay | Admitting: *Deleted

## 2020-10-07 DIAGNOSIS — J439 Emphysema, unspecified: Secondary | ICD-10-CM

## 2020-10-07 NOTE — Telephone Encounter (Signed)
Fax from [facility] 160-4.5 mcg/act INH Note: product not on formulary Advair disk is preferred Please advise

## 2020-10-09 MED ORDER — FLUTICASONE-SALMETEROL 250-50 MCG/DOSE IN AEPB: 1.0000 | INHALATION_SPRAY | Freq: Two times a day (BID) | RESPIRATORY_TRACT | 3 refills | Status: DC

## 2020-10-09 NOTE — Addendum Note (Signed)
Addended by: Caryl Pina on: 10/09/2020 10:15 AM   Modules accepted: Orders

## 2020-10-09 NOTE — Telephone Encounter (Signed)
Sent Advair for the patient because his other inhaler Symbicort was no longer covered by insurance

## 2020-10-14 ENCOUNTER — Ambulatory Visit (INDEPENDENT_AMBULATORY_CARE_PROVIDER_SITE_OTHER): Payer: Medicare HMO | Admitting: *Deleted

## 2020-10-14 DIAGNOSIS — Z0000 Encounter for general adult medical examination without abnormal findings: Secondary | ICD-10-CM | POA: Diagnosis not present

## 2020-10-14 NOTE — Progress Notes (Signed)
MEDICARE ANNUAL WELLNESS VISIT  10/14/2020  Telephone Visit Disclaimer This Medicare AWV was conducted by telephone due to national recommendations for restrictions regarding the COVID-19 Pandemic (e.g. social distancing).  I verified, using two identifiers, that I am speaking with Charles Castro or their authorized healthcare agent. I discussed the limitations, risks, security, and privacy concerns of performing an evaluation and management service by telephone and the potential availability of an in-person appointment in the future. The patient expressed understanding and agreed to proceed.  Location of Patient: home Location of Provider (nurse): office  Subjective:    Charles Castro is a 75 y.o. male patient of Dettinger, Fransisca Kaufmann, MD who had a Medicare Annual Wellness Visit today via telephone. Yovan is Retired and lives with their family. he has 1 children. he reports that he is socially active and does interact with friends/family regularly. he is minimally physically active and enjoys walking and woodworking.  Patient Care Team: Dettinger, Fransisca Kaufmann, MD as PCP - General (Family Medicine) Josue Hector, MD as PCP - Cardiology (Cardiology)  Advanced Directives 10/14/2020 08/10/2019 04/26/2018 04/26/2018 05/30/2016  Does Patient Have a Medical Advance Directive? Yes Yes No No No  Type of Paramedic of [place];Living will;Out of facility DNR (pink MOST or yellow form) [place];Living will - - -  Does patient want to make changes to medical advance directive? No - Patient declined No - Patient declined - - -  Copy of [place] in Chart? No - copy requested No - copy requested - - -  Would patient like information on creating a medical advance directive? - - No - Patient declined No - Patient declined No - patient declined information    Hospital Utilization Over the Past 12 Months: # of hospitalizations or ER visits: 0 #  of surgeries: 0  Review of Systems    Patient reports that his overall health is unchanged compared to last year.  History obtained from chart review and the patient  Patient Reported Readings  CBG 150  Pain Assessment Pain : No/denies pain     Current Medications & Allergies (verified) Allergies as of 10/14/2020   No Known Allergies     Medication List       Accurate as of October 14, 2020  4:24 PM. If you have any questions, ask your nurse or doctor.        acetaminophen 325 MG tablet Commonly known as: TYLENOL Take 650 mg by mouth as needed.   aspirin 81 MG EC tablet Take 1 tablet (81 mg total) by mouth daily.   atorvastatin 80 MG tablet Commonly known as: LIPITOR Take 1 tablet (80 mg total) by mouth daily.   budesonide-formoterol 160-4.5 MCG/ACT inhaler Commonly known as: SYMBICORT Inhale 2 puffs into the lungs daily as needed.   clopidogrel 75 MG tablet Commonly known as: PLAVIX Take 1 tablet (75 mg total) by mouth daily.   Fish Oil 1000 MG Caps Take by mouth.   Fluticasone-Salmeterol 250-50 MCG/DOSE Aepb Commonly known as: Advair Diskus Inhale 1 puff into the lungs 2 (two) times daily.   glipiZIDE 5 MG 24 hr tablet Commonly known as: GLUCOTROL XL Take 1 tablet (5 mg total) by mouth daily.   lisinopril 2.5 MG tablet Commonly known as: ZESTRIL Take 1 tablet (2.5 mg total) by mouth daily.   metFORMIN 500 MG 24 hr tablet Commonly known as: GLUCOPHAGE-XR Take 1 tablet (500 mg total) by  mouth in the morning and at bedtime.   nitroGLYCERIN 0.4 MG SL tablet Commonly known as: NITROSTAT Place 1 tablet (0.4 mg total) under the tongue every 5 (five) minutes x 3 doses as needed for chest pain.   OneTouch Ultra test strip Generic drug: glucose blood Use as instructed   pantoprazole 20 MG tablet Commonly known as: PROTONIX Take 1 tablet (20 mg total) by mouth daily.       History (reviewed): Past Medical History:  Diagnosis Date  . Anemia   .  Colon polyp   . COPD (chronic obstructive pulmonary disease) ([place])   . Diverticulosis   . DM type 2 with diabetic dyslipidemia ([place])   . HTN (hypertension)   . MI (myocardial infarction) ([place]) 05/28/2016   DES to RCA  . Nicotine dependence   . STEMI (ST elevation myocardial infarction) ([place])   . Type 2 diabetes mellitus ([place])    Past Surgical History:  Procedure Laterality Date  . CARDIAC CATHETERIZATION N/A 05/28/2016   Procedure: Left Heart Cath and Coronary Angiography;  Surgeon: Nelva Bush, MD;  Location: [facility];  Service: Cardiovascular;  Laterality: N/A;  . CARDIAC CATHETERIZATION N/A 05/28/2016   Procedure: Coronary Stent Intervention;  Surgeon: Nelva Bush, MD;  Location: [facility];  Service: Cardiovascular;  Laterality: N/A;   Family History  Problem Relation Age of Onset  . CAD Father         with  multiple MI, first at age 71  . Diabetes Father   . CAD Brother        First Mi at age 32,  . Diabetes Brother   . Colon cancer Mother        in her 74s   . Diabetes Sister    Social History   Socioeconomic History  . Marital status: Widowed    Spouse name: Not on file  . Number of children: 1  . Years of education: Not on file  . Highest education level: High school graduate  Occupational History  . Occupation: Retired    Comment: 2008  Tobacco Use  . Smoking status: Former Smoker    Packs/day: 0.50    Years: 30.00    Pack years: 15.00    Types: Cigarettes    Start date: 05/15/1968    Quit date: 06/10/2019    Years since quitting: 1.3  . Smokeless tobacco: Never Used  Vaping Use  . Vaping Use: Never used  Substance and Sexual Activity  . Alcohol use: Not Currently  . Drug use: No  . Sexual activity: Not Currently    Birth control/protection: None  Other Topics Concern  . Not on file  Social History Narrative  . Not on file   Social Determinants of Health   Financial Resource Strain: Low Risk   . Difficulty of Paying  Living Expenses: Not hard at all  Food Insecurity: No Food Insecurity  . Worried About Charity fundraiser in the Last Year: Never true  . Ran Out of Food in the Last Year: Never true  Transportation Needs: No Transportation Needs  . Lack of Transportation (Medical): No  . Lack of Transportation (Non-Medical): No  Physical Activity: Not on file  Stress: No Stress Concern Present  . Feeling of Stress : Only a little  Social Connections: Moderately Isolated  . Frequency of Communication with Friends and Family: More than three times a week  . Frequency of Social Gatherings with Friends and Family: More than  three times a week  . Attends Religious Services: 1 to 4 times per year  . Active Member of Clubs or Organizations: No  . Attends Archivist Meetings: Never  . Marital Status: Widowed    Activities of Daily Living In your present state of health, do you have any difficulty performing the following activities: 10/14/2020  Hearing? N  Vision? N  Difficulty concentrating or making decisions? N  Walking or climbing stairs? N  Dressing or bathing? N  Doing errands, shopping? N  Preparing Food and eating ? N  Using the Toilet? N  In the past six months, have you accidently leaked urine? N  Do you have problems with loss of bowel control? N  Managing your Medications? N  Managing your Finances? N  Housekeeping or managing your Housekeeping? N  Some recent data might be hidden    Patient Education/ Literacy How often do you need to have someone help you when you read instructions, pamphlets, or other written materials from your doctor or pharmacy?: 1 - Never What is the last grade level you completed in school?: 8th  Exercise Current Exercise Habits: Home exercise routine, Type of exercise: walking, Time (Minutes): 35, Frequency (Times/Week): 4, Weekly Exercise (Minutes/Week): 140, Intensity: Mild, Exercise limited by: None identified  Diet Patient reports consuming 2  meals a day and 2 snack(s) a day Patient reports that his primary diet is: Regular, Diabetic Patient reports that she does have regular access to food.   Depression Screen PHQ 2/9 Scores 10/14/2020 08/08/2020 05/08/2020 01/04/2020 10/02/2019 08/10/2019 05/29/2019  PHQ - 2 Score 0 0 0 0 0 0 0     Fall Risk Fall Risk  10/14/2020 08/08/2020 05/08/2020 01/04/2020 10/02/2019  Falls in the past year? 0 0 0 0 0     Objective:  Charles Castro seemed alert and oriented and he participated appropriately during our telephone visit.  Blood Pressure Weight BMI  BP Readings from Last 3 Encounters:  08/08/20 139/84  05/08/20 (!) 142/58  01/05/20 120/70   Wt Readings from Last 3 Encounters:  08/08/20 172 lb (78 kg)  05/08/20 166 lb (75.3 kg)  01/05/20 164 lb 3.2 oz (74.5 kg)   BMI Readings from Last 1 Encounters:  08/08/20 23.99 kg/m    *Unable to obtain current vital signs, weight, and BMI due to telephone visit type  Hearing/Vision  . Farhan did not seem to have difficulty with hearing/understanding during the telephone conversation . Reports that he has had a formal eye exam by an eye care professional within the past year . Reports that he has not had a formal hearing evaluation within the past year *Unable to fully assess hearing and vision during telephone visit type  Cognitive Function: 6CIT Screen 10/14/2020 08/10/2019  What Year? 0 points 0 points  What month? 0 points 0 points  What time? 0 points 0 points  Count back from 20 0 points 0 points  Months in reverse 0 points 4 points  Repeat phrase 0 points 4 points  Total Score 0 8   (Normal:0-7, Significant for Dysfunction: >8)  Normal Cognitive Function Screening: Yes   Immunization & Health Maintenance Record Immunization History  Administered Date(s) Administered  . Fluad Quad(high Dose 65+) 05/04/2019, 05/08/2020  . Influenza, High Dose Seasonal PF 05/07/2015, 05/12/2017, 05/17/2018  . Influenza, Seasonal, Injecte, Preservative  Fre 05/31/2016  . Influenza,inj,Quad PF,6+ Mos 05/31/2016  . Influenza,trivalent, recombinat, inj, PF 05/01/2014  . Moderna Sars-Covid-2 Vaccination 10/11/2019, 11/08/2019  .  Pneumococcal Conjugate-13 08/19/2016, 02/15/2018  . Pneumococcal Polysaccharide-23 02/04/2015  . Tdap 02/04/2015    Health Maintenance  Topic Date Due  . Hepatitis C Screening  Never done  . FOOT EXAM  10/01/2020  . COVID-19 Vaccine (3 - Booster for Moderna series) 11/06/2020 (Originally 05/09/2020)  . COLONOSCOPY (Pts 45-36yrs Insurance coverage will need to be confirmed)  02/05/2021 (Originally 08/05/2020)  . OPHTHALMOLOGY EXAM  01/21/2021  . HEMOGLOBIN A1C  02/05/2021  . TETANUS/TDAP  02/03/2025  . INFLUENZA VACCINE  Completed  . PNA vac Low Risk Adult  Completed  . HPV VACCINES  Aged Out       Assessment  This is a routine wellness examination for Charles Castro.  Health Maintenance: Due or Overdue Health Maintenance Due  Topic Date Due  . Hepatitis C Screening  Never done  . FOOT EXAM  10/01/2020    Charles Castro does not need a referral for Community Assistance: Care Management:   no Social Work:    no Prescription Assistance:  no Nutrition/Diabetes Education:  no   Plan:  Personalized Goals Goals Addressed            This Visit's Progress   . DIET - INCREASE WATER INTAKE   Not on track    Try to drink 6-8 glasses of water daily    . Increase physical activity        Personalized Health Maintenance & Screening Recommendations   Foot exam Hep C test Shingles vaccine  Lung Cancer Screening Recommended: no (Low Dose CT Chest recommended if Age 63-80 years, 30 pack-year currently smoking OR have quit w/in past 15 years) Hepatitis C Screening recommended: yes HIV Screening recommended: no  Advanced Directives: Written information was not prepared per patient's request.  Referrals & Orders No orders of the defined types were placed in this encounter.   Follow-up  Plan . Follow-up with Dettinger, Fransisca Kaufmann, MD as planned on 11/07/20 needs foot exam at this appointment. Offer shingles vaccine.Hep C test due. . Pt is active with family. . Pt is independent of all ADL's . No hearing or vision difficulties per pt. . Pt has living will, POA and DNR, copies requested for our records. . Voices no healthcare concerns at this time. . AVS printed and mailed to pt.     I have personally reviewed and noted the following in the patient's chart:   . Medical and social history . Use of alcohol, tobacco or illicit drugs  . Current medications and supplements . Functional ability and status . Nutritional status . Physical activity . Advanced directives . List of other physicians . Hospitalizations, surgeries, and ER visits in previous 12 months . Vitals . Screenings to include cognitive, depression, and falls . Referrals and appointments  In addition, I have reviewed and discussed with Charles Castro certain preventive protocols, quality metrics, and best practice recommendations. A written personalized care plan for preventive services as well as general preventive health recommendations is available and can be mailed to the patient at his request.      Rana Snare, LPN  [id number]/03/[id number]

## 2020-10-15 ENCOUNTER — Ambulatory Visit (INDEPENDENT_AMBULATORY_CARE_PROVIDER_SITE_OTHER): Payer: Medicare HMO | Admitting: Family Medicine

## 2020-10-15 ENCOUNTER — Encounter: Payer: Self-pay | Admitting: Family Medicine

## 2020-10-15 VITALS — BP 145/71 | HR 73 | Temp 99.0°F

## 2020-10-15 DIAGNOSIS — J441 Chronic obstructive pulmonary disease with (acute) exacerbation: Secondary | ICD-10-CM | POA: Diagnosis not present

## 2020-10-15 MED ORDER — AZITHROMYCIN 250 MG PO TABS: ORAL_TABLET | ORAL | 0 refills | Status: DC

## 2020-10-15 MED ORDER — METHYLPREDNISOLONE 4 MG PO TBPK: ORAL_TABLET | ORAL | 0 refills | Status: DC

## 2020-10-15 NOTE — Progress Notes (Signed)
Assessment & Plan:  1. COPD exacerbation ([place]) Discussed symptom management as well. Encouraged Flonase and antihistamine daily in addition to prescriptions sent.  - azithromycin (ZITHROMAX Z-PAK) 250 MG tablet; Take 2 tablets (500 mg) PO today, then 1 tablet (250 mg) PO daily x4 days.  Dispense: 6 tablet; Refill: 0 - methylPREDNISolone (MEDROL DOSEPAK) 4 MG TBPK tablet; Use as directed.  Dispense: 21 each; Refill: 0   Follow up plan: Return if symptoms worsen or fail to improve.  Hendricks Limes, MSN, APRN, FNP-C [facility]  Subjective:   Patient ID: Charles Castro, male    DOB: July 14, 1946, 75 y.o.   MRN: [id number]  HPI: Charles Castro is a 75 y.o. male presenting on 10/15/2020 for Sore Throat (X 3 weeks. Patient states it is just scratchy. He did have sinus problems but all of those cleared up.  Patients voice is horse. )  Patient complains of cough, chest congestion, sore throat, postnasal drainage and hoarseness.  He is coughing more than his usual and it is productive.  He reports he was previously having sinus symptoms including pressure in his face, runny nose, sneezing, and head congestion, but the symptoms have resolved.  Onset of symptoms was 3 weeks ago, initial symptoms have improved but now he is having symptoms down in his chest. He is drinking plenty of fluids. Evaluation to date: none. He has a history of COPD. He does not smoke. Patient has been vaccinated against COVID-19.   ROS: Negative unless specifically indicated above in HPI.   Relevant past medical history reviewed and updated as indicated.   Allergies and medications reviewed and updated.   Current Outpatient Medications:  .  acetaminophen (TYLENOL) 325 MG tablet, Take 650 mg by mouth as needed., Disp: , Rfl:  .  aspirin EC 81 MG EC tablet, Take 1 tablet (81 mg total) by mouth daily., Disp: 30 tablet, Rfl: 11 .  atorvastatin (LIPITOR) 80 MG tablet, Take 1 tablet (80 mg total) by mouth  daily., Disp: 90 tablet, Rfl: 3 .  budesonide-formoterol (SYMBICORT) 160-4.5 MCG/ACT inhaler, Inhale 2 puffs into the lungs daily as needed., Disp: 10.2 g, Rfl: 11 .  clopidogrel (PLAVIX) 75 MG tablet, Take 1 tablet (75 mg total) by mouth daily., Disp: 90 tablet, Rfl: 3 .  Fluticasone-Salmeterol (ADVAIR DISKUS) 250-50 MCG/DOSE AEPB, Inhale 1 puff into the lungs 2 (two) times daily., Disp: 1 each, Rfl: 3 .  glipiZIDE (GLUCOTROL XL) 5 MG 24 hr tablet, Take 1 tablet (5 mg total) by mouth daily., Disp: 90 tablet, Rfl: 3 .  glucose blood (ONETOUCH ULTRA) test strip, Use as instructed, Disp: 100 each, Rfl: 12 .  lisinopril (ZESTRIL) 2.5 MG tablet, Take 1 tablet (2.5 mg total) by mouth daily., Disp: 90 tablet, Rfl: 3 .  metFORMIN (GLUCOPHAGE-XR) 500 MG 24 hr tablet, Take 1 tablet (500 mg total) by mouth in the morning and at bedtime., Disp: 180 tablet, Rfl: 3 .  nitroGLYCERIN (NITROSTAT) 0.4 MG SL tablet, Place 1 tablet (0.4 mg total) under the tongue every 5 (five) minutes x 3 doses as needed for chest pain., Disp: 25 tablet, Rfl: 3 .  Omega-3 Fatty Acids (FISH OIL) 1000 MG CAPS, Take by mouth., Disp: , Rfl:  .  pantoprazole (PROTONIX) 20 MG tablet, Take 1 tablet (20 mg total) by mouth daily., Disp: 90 tablet, Rfl: 3  No Known Allergies  Objective:   BP (!) 145/71   Pulse 73   Temp 99 F (37.2 C) (  Temporal)   SpO2 93%    Physical Exam Vitals reviewed.  Constitutional:      General: He is not in acute distress.    Appearance: Normal appearance. He is not ill-appearing, toxic-appearing or diaphoretic.  HENT:     Head: Normocephalic and atraumatic.     Right Ear: Tympanic membrane, ear canal and external ear normal. There is no impacted cerumen.     Left Ear: Tympanic membrane, ear canal and external ear normal. There is no impacted cerumen.     Nose: Nose normal. No congestion or rhinorrhea.     Mouth/Throat:     Mouth: Mucous membranes are moist.     Pharynx: Oropharynx is clear. No  oropharyngeal exudate or posterior oropharyngeal erythema.  Eyes:     General: No scleral icterus.       Right eye: No discharge.        Left eye: No discharge.     Conjunctiva/sclera: Conjunctivae normal.  Cardiovascular:     Rate and Rhythm: Normal rate and regular rhythm.     Heart sounds: Normal heart sounds. No murmur heard. No friction rub. No gallop.   Pulmonary:     Effort: Pulmonary effort is normal. No respiratory distress.     Breath sounds: Normal breath sounds. No stridor. No wheezing, rhonchi or rales.  Musculoskeletal:        General: Normal range of motion.     Cervical back: Normal range of motion.  Lymphadenopathy:     Cervical: No cervical adenopathy.  Skin:    General: Skin is warm and dry.  Neurological:     Mental Status: He is alert and oriented to person, place, and time. Mental status is at baseline.  Psychiatric:        Mood and Affect: Mood normal.        Behavior: Behavior normal.        Thought Content: Thought content normal.        Judgment: Judgment normal.

## 2020-11-07 ENCOUNTER — Other Ambulatory Visit: Payer: Self-pay

## 2020-11-07 ENCOUNTER — Encounter: Payer: Self-pay | Admitting: Family Medicine

## 2020-11-07 ENCOUNTER — Ambulatory Visit (INDEPENDENT_AMBULATORY_CARE_PROVIDER_SITE_OTHER): Payer: Medicare HMO | Admitting: Family Medicine

## 2020-11-07 VITALS — BP 137/73 | HR 89 | Ht 71.0 in | Wt 171.0 lb

## 2020-11-07 DIAGNOSIS — E782 Mixed hyperlipidemia: Secondary | ICD-10-CM | POA: Diagnosis not present

## 2020-11-07 DIAGNOSIS — E1169 Type 2 diabetes mellitus with other specified complication: Secondary | ICD-10-CM | POA: Diagnosis not present

## 2020-11-07 DIAGNOSIS — Z23 Encounter for immunization: Secondary | ICD-10-CM

## 2020-11-07 DIAGNOSIS — E1159 Type 2 diabetes mellitus with other circulatory complications: Secondary | ICD-10-CM

## 2020-11-07 DIAGNOSIS — I152 Hypertension secondary to endocrine disorders: Secondary | ICD-10-CM | POA: Diagnosis not present

## 2020-11-07 LAB — BAYER DCA HB A1C WAIVED: HB A1C (BAYER DCA - WAIVED): 6.2 % (ref <7.0)

## 2020-11-07 NOTE — Progress Notes (Signed)
BP 137/73   Pulse 89   Ht 5' 11"  (1.803 m)   Wt 171 lb (77.6 kg)   SpO2 94%   BMI 23.85 kg/m    Subjective:   Patient ID: Charles Castro, male    DOB: [birth year], 75 y.o.   MRN: [id number]  HPI: Charles Castro is a 75 y.o. male presenting on 11/07/2020 for Medical Management of Chronic Issues and Diabetes   HPI Type 2 diabetes mellitus Patient comes in today for recheck of his diabetes. Patient has been currently taking glipizide and Metformin. Patient is currently on an ACE inhibitor/ARB. Patient has not seen an ophthalmologist this year. Patient denies any issues with their feet. The symptom started onset as an adult hypertension and hyperlipidemia ARE RELATED TO DM   Hypertension Patient is currently on lisinopril, and their blood pressure today is 137/73. Patient denies any lightheadedness or dizziness. Patient denies headaches, blurred vision, chest pains, shortness of breath, or weakness. Denies any side effects from medication and is content with current medication.   Hyperlipidemia Patient is coming in for recheck of his hyperlipidemia. The patient is currently taking fish oil and atorvastatin. They deny any issues with myalgias or history of liver damage from it. They deny any focal numbness or weakness or chest pain.   Relevant past medical, surgical, family and social history reviewed and updated as indicated. Interim medical history since our last visit reviewed. Allergies and medications reviewed and updated.  Review of Systems  Constitutional: Negative for chills and fever.  Eyes: Negative for visual disturbance.  Respiratory: Negative for shortness of breath and wheezing.   Cardiovascular: Negative for chest pain and leg swelling.  Musculoskeletal: Negative for back pain and gait problem.  Skin: Negative for rash.  Neurological: Negative for dizziness, weakness and light-headedness.  All other systems reviewed and are negative.   Per HPI unless specifically  indicated above   Allergies as of 11/07/2020   No Known Allergies     Medication List       Accurate as of November 07, 2020  7:58 AM. If you have any questions, ask your nurse or doctor.        STOP taking these medications   azithromycin 250 MG tablet Commonly known as: Zithromax Z-Pak Stopped by: Worthy Rancher, MD   budesonide-formoterol 160-4.5 MCG/ACT inhaler Commonly known as: SYMBICORT Stopped by: Fransisca Kaufmann Ashleymarie Granderson, MD     TAKE these medications   acetaminophen 325 MG tablet Commonly known as: TYLENOL Take 650 mg by mouth as needed.   aspirin 81 MG EC tablet Take 1 tablet (81 mg total) by mouth daily.   atorvastatin 80 MG tablet Commonly known as: LIPITOR Take 1 tablet (80 mg total) by mouth daily.   clopidogrel 75 MG tablet Commonly known as: PLAVIX Take 1 tablet (75 mg total) by mouth daily.   Fish Oil 1000 MG Caps Take by mouth.   Fluticasone-Salmeterol 250-50 MCG/DOSE Aepb Commonly known as: Advair Diskus Inhale 1 puff into the lungs 2 (two) times daily.   glipiZIDE 5 MG 24 hr tablet Commonly known as: GLUCOTROL XL Take 1 tablet (5 mg total) by mouth daily.   lisinopril 2.5 MG tablet Commonly known as: ZESTRIL Take 1 tablet (2.5 mg total) by mouth daily.   metFORMIN 500 MG 24 hr tablet Commonly known as: GLUCOPHAGE-XR Take 1 tablet (500 mg total) by mouth in the morning and at bedtime.   methylPREDNISolone 4 MG Tbpk tablet Commonly known as: MEDROL  DOSEPAK Use as directed.   nitroGLYCERIN 0.4 MG SL tablet Commonly known as: NITROSTAT Place 1 tablet (0.4 mg total) under the tongue every 5 (five) minutes x 3 doses as needed for chest pain.   OneTouch Ultra test strip Generic drug: glucose blood Use as instructed   pantoprazole 20 MG tablet Commonly known as: PROTONIX Take 1 tablet (20 mg total) by mouth daily.        Objective:   BP 137/73   Pulse 89   Ht 5' 11"  (1.803 m)   Wt 171 lb (77.6 kg)   SpO2 94%   BMI 23.85  kg/m   Wt Readings from Last 3 Encounters:  11/07/20 171 lb (77.6 kg)  08/08/20 172 lb (78 kg)  05/08/20 166 lb (75.3 kg)    Physical Exam Vitals and nursing note reviewed.  Constitutional:      General: He is not in acute distress.    Appearance: He is well-developed. He is not diaphoretic.  Eyes:     General: No scleral icterus.    Conjunctiva/sclera: Conjunctivae normal.  Neck:     Thyroid: No thyromegaly.  Cardiovascular:     Rate and Rhythm: Normal rate and regular rhythm.     Heart sounds: Normal heart sounds. No murmur heard.   Pulmonary:     Effort: Pulmonary effort is normal. No respiratory distress.     Breath sounds: Normal breath sounds. No wheezing.  Musculoskeletal:        General: Normal range of motion.     Cervical back: Neck supple.  Lymphadenopathy:     Cervical: No cervical adenopathy.  Skin:    General: Skin is warm and dry.     Findings: No rash.  Neurological:     Mental Status: He is alert and oriented to person, place, and time.     Coordination: Coordination normal.  Psychiatric:        Behavior: Behavior normal.       Assessment & Plan:   Problem List Items Addressed This Visit      Cardiovascular and Mediastinum   Hypertension associated with diabetes ([place])   Relevant Orders   CMP14+EGFR   Lipid panel     Endocrine   Type 2 diabetes mellitus with other specified complication ([place]) - Primary   Relevant Orders   CBC with Differential/Platelet   CMP14+EGFR   Lipid panel   Bayer DCA Hb A1c Waived     Other   HLD (hyperlipidemia)   Relevant Orders   Lipid panel      Continue current medication, will check blood work. Follow up plan: Return in about 3 months (around 02/06/2021), or if symptoms worsen or fail to improve, for Diabetes and hypertension and COPD.  Counseling provided for all of the vaccine components No orders of the defined types were placed in this encounter.   Caryl Pina, MD [facility] 11/07/2020, 7:58 AM

## 2020-11-08 LAB — CBC WITH DIFFERENTIAL/PLATELET
Basophils Absolute: 0.1 10*3/uL (ref 0.0–0.2)
Basos: 1 %
EOS (ABSOLUTE): 0.9 10*3/uL — ABNORMAL HIGH (ref 0.0–0.4)
Eos: 8 %
Hematocrit: 36.2 % — ABNORMAL LOW (ref 37.5–51.0)
Hemoglobin: 11.4 g/dL — ABNORMAL LOW (ref 13.0–17.7)
Immature Grans (Abs): 0 10*3/uL (ref 0.0–0.1)
Immature Granulocytes: 0 %
Lymphocytes Absolute: 1.8 10*3/uL (ref 0.7–3.1)
Lymphs: 17 %
MCH: 27 pg (ref 26.6–33.0)
MCHC: 31.5 g/dL (ref 31.5–35.7)
MCV: 86 fL (ref 79–97)
Monocytes Absolute: 0.7 10*3/uL (ref 0.1–0.9)
Monocytes: 7 %
Neutrophils Absolute: 7.3 10*3/uL — ABNORMAL HIGH (ref 1.4–7.0)
Neutrophils: 67 %
Platelets: 277 10*3/uL (ref 150–450)
RBC: 4.22 x10E6/uL (ref 4.14–5.80)
RDW: 15.2 % (ref 11.6–15.4)
WBC: 10.8 10*3/uL (ref 3.4–10.8)

## 2020-11-08 LAB — CMP14+EGFR
ALT: 15 IU/L (ref 0–44)
AST: 16 IU/L (ref 0–40)
Albumin/Globulin Ratio: 1.8 (ref 1.2–2.2)
Albumin: 4.4 g/dL (ref 3.7–4.7)
Alkaline Phosphatase: 87 IU/L (ref 44–121)
BUN/Creatinine Ratio: 16 (ref 10–24)
BUN: 13 mg/dL (ref 8–27)
Bilirubin Total: 0.3 mg/dL (ref 0.0–1.2)
CO2: 20 mmol/L (ref 20–29)
Calcium: 9.8 mg/dL (ref 8.6–10.2)
Chloride: 99 mmol/L (ref 96–106)
Creatinine, Ser: 0.81 mg/dL (ref 0.76–1.27)
Globulin, Total: 2.4 g/dL (ref 1.5–4.5)
Glucose: 114 mg/dL — ABNORMAL HIGH (ref 65–99)
Potassium: 4.5 mmol/L (ref 3.5–5.2)
Sodium: 137 mmol/L (ref 134–144)
Total Protein: 6.8 g/dL (ref 6.0–8.5)
eGFR: 94 mL/min/{1.73_m2} (ref >59)

## 2020-11-08 LAB — LIPID PANEL
Chol/HDL Ratio: 2.5 ratio (ref 0.0–5.0)
Cholesterol, Total: 89 mg/dL — ABNORMAL LOW (ref 100–199)
HDL: 36 mg/dL — ABNORMAL LOW (ref >39)
LDL Chol Calc (NIH): 37 mg/dL (ref 0–99)
Triglycerides: 73 mg/dL (ref 0–149)
VLDL Cholesterol Cal: 16 mg/dL (ref 5–40)

## 2021-01-01 NOTE — Progress Notes (Signed)
Cardiology Office Note    Date:  01/01/2021   ID:  Charles Castro, DOB 1946-03-25, MRN [id number]  PCP:  Dettinger, Fransisca Kaufmann, MD  Cardiologist: Jenkins Rouge, MD    No chief complaint on file.   History of Present Illness:    Charles Castro is a 75 y.o. male with past medical history of CAD (s/p STEMI in 05/2016 with DES to mid-RCA), HTN, HLD, GERD, Type 2 DM, and tobacco use who presents to the office today for follow-up. Seen in [facility] 04/26/18 with atypical chest pain after eating a taco R/O. TTE with normal EF 60% no RWMA;s Done 04/26/18    No angina on plavix Compliant with meds  LDL at goal 37 on statin 11/07/20  A1c improved 6.2   Active planting flowers and vegetables but deer have gotten some Son is with him 3 days/week and has A farm in [place] new nitro   Still smoking and uses inhaler some  Lung cancer screening CT negative 01/25/20    Past Medical History:  Diagnosis Date   Anemia    Colon polyp    COPD (chronic obstructive pulmonary disease) ([place])    Diverticulosis    DM type 2 with diabetic dyslipidemia ([place])    HTN (hypertension)    MI (myocardial infarction) ([place]) 05/28/2016   DES to RCA   Nicotine dependence    STEMI (ST elevation myocardial infarction) ([place])    Type 2 diabetes mellitus ([place])     Past Surgical History:  Procedure Laterality Date   CARDIAC CATHETERIZATION N/A 05/28/2016   Procedure: Left Heart Cath and Coronary Angiography;  Surgeon: Nelva Bush, MD;  Location: [facility];  Service: Cardiovascular;  Laterality: N/A;   CARDIAC CATHETERIZATION N/A 05/28/2016   Procedure: Coronary Stent Intervention;  Surgeon: Nelva Bush, MD;  Location: [facility];  Service: Cardiovascular;  Laterality: N/A;    Current Medications: Outpatient Medications Prior to Visit  Medication Sig Dispense Refill   acetaminophen (TYLENOL) 325 MG tablet Take 650 mg by mouth as needed.     aspirin EC 81 MG EC tablet Take 1 tablet (81 mg  total) by mouth daily. 30 tablet 11   atorvastatin (LIPITOR) 80 MG tablet Take 1 tablet (80 mg total) by mouth daily. 90 tablet 3   clopidogrel (PLAVIX) 75 MG tablet Take 1 tablet (75 mg total) by mouth daily. 90 tablet 3   Fluticasone-Salmeterol (ADVAIR DISKUS) 250-50 MCG/DOSE AEPB Inhale 1 puff into the lungs 2 (two) times daily. 1 each 3   glipiZIDE (GLUCOTROL XL) 5 MG 24 hr tablet Take 1 tablet (5 mg total) by mouth daily. 90 tablet 3   glucose blood (ONETOUCH ULTRA) test strip Use as instructed 100 each 12   lisinopril (ZESTRIL) 2.5 MG tablet Take 1 tablet (2.5 mg total) by mouth daily. 90 tablet 3   metFORMIN (GLUCOPHAGE-XR) 500 MG 24 hr tablet Take 1 tablet (500 mg total) by mouth in the morning and at bedtime. 180 tablet 3   nitroGLYCERIN (NITROSTAT) 0.4 MG SL tablet Place 1 tablet (0.4 mg total) under the tongue every 5 (five) minutes x 3 doses as needed for chest pain. 25 tablet 3   Omega-3 Fatty Acids (FISH OIL) 1000 MG CAPS Take by mouth.     pantoprazole (PROTONIX) 20 MG tablet Take 1 tablet (20 mg total) by mouth daily. 90 tablet 3   No facility-administered medications prior to visit.     Allergies:  Patient has no known allergies.   Social History   Socioeconomic History   Marital status: Widowed    Spouse name: Not on file   Number of children: 1   Years of education: Not on file   Highest education level: High school graduate  Occupational History   Occupation: Retired    Comment: 2008  Tobacco Use   Smoking status: Former Smoker    Packs/day: 0.50    Years: 30.00    Pack years: 15.00    Types: Cigarettes    Start date: 05/15/1968    Quit date: 06/10/2019    Years since quitting: 1.5   Smokeless tobacco: Never Used  Vaping Use   Vaping Use: Never used  Substance and Sexual Activity   Alcohol use: Not Currently   Drug use: No   Sexual activity: Not Currently    Birth control/protection: None  Other Topics Concern   Not on file  Social History Narrative    Not on file   Social Determinants of Health   Financial Resource Strain: Low Risk    Difficulty of Paying Living Expenses: Not hard at all  Food Insecurity: No Food Insecurity   Worried About Charity fundraiser in the Last Year: Never true   [place] in the Last Year: Never true  Transportation Needs: No Transportation Needs   Lack of Transportation (Medical): No   Lack of Transportation (Non-Medical): No  Physical Activity: Not on file  Stress: No Stress Concern Present   Feeling of Stress : Only a little  Social Connections: Moderately Isolated   Frequency of Communication with Friends and Family: More than three times a week   Frequency of Social Gatherings with Friends and Family: More than three times a week   Attends Religious Services: 1 to 4 times per year   Active Member of [employer] or Organizations: No   Attends Archivist Meetings: Never   Marital Status: Widowed     Family History:  The patient's family history includes CAD in his brother and father; Colon cancer in his mother; Diabetes in his brother, father, and sister.   Review of Systems:   Please see the history of present illness.     General:  No chills, fever, night sweats or weight changes.  Cardiovascular:  No chest pain, dyspnea on exertion, edema, orthopnea, palpitations, paroxysmal nocturnal dyspnea. Positive for chest pain (now resolved).  Dermatological: No rash, lesions/masses Respiratory: No cough, dyspnea Urologic: No hematuria, dysuria Abdominal:   No nausea, vomiting, diarrhea, bright red blood per rectum, melena, or hematemesis Neurologic:  No visual changes, wkns, changes in mental status. All other systems reviewed and are otherwise negative except as noted above.   Physical Exam:    VS:  There were no vitals taken for this visit.   Affect appropriate Healthy:  appears stated age 40: normal Neck supple with no adenopathy JVP normal no bruits no thyromegaly Lungs  bilateral exp  wheezing and good diaphragmatic motion Heart:  S1/S2 no murmur, no rub, gallop or click PMI normal Abdomen: benighn, BS positve, no tenderness, no AAA no bruit.  No HSM or HJR Distal pulses intact with no bruits No edema Neuro non-focal Skin warm and dry No muscular weakness   Wt Readings from Last 3 Encounters:  11/07/20 77.6 kg  08/08/20 78 kg  05/08/20 75.3 kg     Studies/Labs Reviewed:   EKG:  SR RBBB no acute changes 01/05/20  01/08/2021 SR rate  [place] [facility]   Recent Labs: 11/07/2020: ALT 15; BUN 13; Creatinine, Ser 0.81; Hemoglobin 11.4; Platelets 277; Potassium 4.5; Sodium 137   Lipid Panel    Component Value Date/Time   CHOL 89 (L) 11/07/2020 0815   TRIG 73 11/07/2020 0815   HDL 36 (L) 11/07/2020 0815   CHOLHDL 2.5 11/07/2020 0815   CHOLHDL 2.0 06/08/2016 0827   VLDL 11 06/08/2016 0827   LDLCALC 37 11/07/2020 0815    Additional studies/ records that were reviewed today include:   Cardiac Catheterization: 05/2016 Conclusions: Significant one vessel coronary artery disease with 60% proximal and 100% mid RCA stenoses. Mild to moderate, nonobstructive CAD involving the LMCA, LAD, LCx, and distal RCA. Normal left ventricular filling pressure. Successful PCI to proximal and mid RCA with placement of a Promus Premier 3.0 x 38 mm drug-eluting stent (post dilated to 3.5 mm) with 0% residual stenosis and TIMI-3 flow.   Recommendations: Dual antiplatelet therapy with aspirin and ticagrelor for at least 12 months. Aggressive secondary prevention. Obtain transthoracic echocardiogram in AM for evaluation of LV function. Remove right femoral vein sheath when ACT < 150 seconds.     Echocardiogram: 04/26/2018 Study Conclusions   - Left ventricle: The cavity size was normal. Wall thickness was   increased in a pattern of mild LVH. Systolic function was normal.   The estimated ejection fraction was 60%. Wall motion was normal;   there were no regional wall  motion abnormalities. Left   ventricular diastolic function parameters were normal. - Aortic valve: Trileaflet; mildly thickened leaflets.   Plan:   In order of problems listed above:  1. CAD - s/p STEMI in 05/2016 with DES to mid-RCA. Admitted for chest pain 04/26/18  felt to be atypical for a cardiac etiology and ruled-out for ACS. Echo showed a preserved EF of 60% with no regional wall motion abnormalities. On plavix  No angina and active continue medical Rx New nitro called in   2. HTN - Well controlled.  Continue current medications and low sodium Dash type diet.     3. HLD - followed by PCP. Goal LDL is < 70 with known CAD. Remains on Atorvastatin 80mg  daily. LDL 36 normal LFTls   4. Tobacco Use - he continues to smoke 0.5 ppd. Cessation advised. No intention of quitting at this time. Lung cancer screening CT  01/25/20 COPD no cancer or nodules will order f/u   5. DM:  Discussed low carb diet.  Target hemoglobin A1c is 6.5 or less.  Continue current medications. A1c improved at 6.2 labs 11/07/20      Lung cancer screening CT smoker   F/u in a year     Jenkins Rouge

## 2021-01-08 ENCOUNTER — Ambulatory Visit: Payer: Medicare HMO | Admitting: Cardiovascular Disease

## 2021-01-08 ENCOUNTER — Encounter: Payer: Self-pay | Admitting: Cardiovascular Disease

## 2021-01-08 ENCOUNTER — Other Ambulatory Visit: Payer: Self-pay

## 2021-01-08 VITALS — BP 124/70 | HR 91 | Ht 71.0 in | Wt 172.2 lb

## 2021-01-08 DIAGNOSIS — R69 Illness, unspecified: Secondary | ICD-10-CM | POA: Diagnosis not present

## 2021-01-08 DIAGNOSIS — F17200 Nicotine dependence, unspecified, uncomplicated: Secondary | ICD-10-CM

## 2021-01-08 DIAGNOSIS — I2510 Atherosclerotic heart disease of native coronary artery without angina pectoris: Secondary | ICD-10-CM

## 2021-01-08 DIAGNOSIS — E782 Mixed hyperlipidemia: Secondary | ICD-10-CM | POA: Diagnosis not present

## 2021-01-08 DIAGNOSIS — I10 Essential (primary) hypertension: Secondary | ICD-10-CM

## 2021-01-08 NOTE — Patient Instructions (Signed)
Medication Instructions:  Your physician recommends that you continue on your current medications as directed. Please refer to the Current Medication list given to you today.  *If you need a refill on your cardiac medications before your next appointment, please call your pharmacy*   Lab Work: BMET 1 week before chest CT  If you have labs (blood work) drawn today and your tests are completely normal, you will receive your results only by: [place] (if you have MyChart) OR A paper copy in the mail If you have any lab test that is abnormal or we need to change your treatment, we will call you to review the results.   Testing/Procedures: Please schedule chest CT for lung cancer screen.    Follow-Up: At [facility], you and your health needs are our priority.  As part of our continuing mission to provide you with exceptional heart care, we have created designated Provider Care Teams.  These Care Teams include your primary Cardiologist (physician) and Advanced Practice Providers (APPs -  Physician Assistants and Nurse Practitioners) who all work together to provide you with the care you need, when you need it.  We recommend signing up for the patient portal called "MyChart".  Sign up information is provided on this After Visit Summary.  MyChart is used to connect with patients for Virtual Visits (Telemedicine).  Patients are able to view lab/test results, encounter notes, upcoming appointments, etc.  Non-urgent messages can be sent to your provider as well.   To learn more about what you can do with MyChart, go to [email or web address].    Your next appointment:   12 month(s)  The format for your next appointment:   In Person  Provider:   Jenkins Rouge, MD   Other Instructions None

## 2021-02-07 ENCOUNTER — Ambulatory Visit (INDEPENDENT_AMBULATORY_CARE_PROVIDER_SITE_OTHER): Payer: Medicare HMO | Admitting: Family Medicine

## 2021-02-07 ENCOUNTER — Encounter: Payer: Self-pay | Admitting: Family Medicine

## 2021-02-07 ENCOUNTER — Other Ambulatory Visit: Payer: Self-pay

## 2021-02-07 VITALS — BP 140/74 | HR 63 | Ht 71.0 in | Wt 168.0 lb

## 2021-02-07 DIAGNOSIS — E1169 Type 2 diabetes mellitus with other specified complication: Secondary | ICD-10-CM | POA: Diagnosis not present

## 2021-02-07 DIAGNOSIS — E1159 Type 2 diabetes mellitus with other circulatory complications: Secondary | ICD-10-CM | POA: Diagnosis not present

## 2021-02-07 DIAGNOSIS — J439 Emphysema, unspecified: Secondary | ICD-10-CM | POA: Diagnosis not present

## 2021-02-07 DIAGNOSIS — Z23 Encounter for immunization: Secondary | ICD-10-CM | POA: Diagnosis not present

## 2021-02-07 DIAGNOSIS — I152 Hypertension secondary to endocrine disorders: Secondary | ICD-10-CM

## 2021-02-07 DIAGNOSIS — E782 Mixed hyperlipidemia: Secondary | ICD-10-CM | POA: Diagnosis not present

## 2021-02-07 LAB — BAYER DCA HB A1C WAIVED: HB A1C (BAYER DCA - WAIVED): 6.4 % (ref <7.0)

## 2021-02-07 NOTE — Progress Notes (Signed)
BP 140/74   Pulse 63   Ht 5\' 11"  (1.803 m)   Wt 168 lb (76.2 kg)   SpO2 94%   BMI 23.43 kg/m    Subjective:   Patient ID: Charles Castro, male    DOB: 10-01-1945, 75 y.o.   MRN: [id number]  HPI: Charles Castro is a 75 y.o. male presenting on 02/07/2021 for Medical Management of Chronic Issues and Diabetes   HPI Type 2 diabetes mellitus Patient comes in today for recheck of his diabetes. Patient has been currently taking glipizide and metformin, A1c is 6.4. Patient is currently on an ACE inhibitor/ARB. Patient has not seen an ophthalmologist this year. Patient denies any issues with their feet. The symptom started onset as an adult hypertension and hyperlipidemia ARE RELATED TO DM   Hypertension Patient is currently on lisinopril, and their blood pressure today is 140/74. Patient denies any lightheadedness or dizziness. Patient denies headaches, blurred vision, chest pains, shortness of breath, or weakness. Denies any side effects from medication and is content with current medication.   Hyperlipidemia Patient is coming in for recheck of his hyperlipidemia. The patient is currently taking fish oils and Lipitor.  They deny any issues with myalgias or history of liver damage from it. They deny any focal numbness or weakness or chest pain.   COPD Patient is coming in for COPD recheck today.  He is currently on Advair.  He has a mild chronic cough but denies any major coughing spells or wheezing spells.  He has 0nighttime symptoms per week and 0daytime symptoms per week currently.   Relevant past medical, surgical, family and social history reviewed and updated as indicated. Interim medical history since our last visit reviewed. Allergies and medications reviewed and updated.  Review of Systems  Constitutional:  Negative for chills and fever.  Respiratory:  Negative for shortness of breath and wheezing.   Cardiovascular:  Negative for chest pain and leg swelling.  Musculoskeletal:   Negative for back pain and gait problem.  Skin:  Negative for rash.  All other systems reviewed and are negative.  Per HPI unless specifically indicated above   Allergies as of 02/07/2021   No Known Allergies      Medication List        Accurate as of February 07, 2021 10:42 AM. If you have any questions, ask your nurse or doctor.          acetaminophen 325 MG tablet Commonly known as: TYLENOL Take 650 mg by mouth as needed.   aspirin 81 MG EC tablet Take 1 tablet (81 mg total) by mouth daily.   atorvastatin 80 MG tablet Commonly known as: LIPITOR Take 1 tablet (80 mg total) by mouth daily.   clopidogrel 75 MG tablet Commonly known as: PLAVIX Take 1 tablet (75 mg total) by mouth daily.   Fish Oil 1000 MG Caps Take by mouth.   Fluticasone-Salmeterol 250-50 MCG/DOSE Aepb Commonly known as: Advair Diskus Inhale 1 puff into the lungs 2 (two) times daily.   glipiZIDE 5 MG 24 hr tablet Commonly known as: GLUCOTROL XL Take 1 tablet (5 mg total) by mouth daily.   lisinopril 2.5 MG tablet Commonly known as: ZESTRIL Take 1 tablet (2.5 mg total) by mouth daily.   metFORMIN 500 MG 24 hr tablet Commonly known as: GLUCOPHAGE-XR Take 1 tablet (500 mg total) by mouth in the morning and at bedtime.   nitroGLYCERIN 0.4 MG SL tablet Commonly known as: NITROSTAT Place 1 tablet (  0.4 mg total) under the tongue every 5 (five) minutes x 3 doses as needed for chest pain.   OneTouch Ultra test strip Generic drug: glucose blood Use as instructed   pantoprazole 20 MG tablet Commonly known as: PROTONIX Take 1 tablet (20 mg total) by mouth daily.         Objective:   BP 140/74   Pulse 63   Ht 5\' 11"  (1.803 m)   Wt 168 lb (76.2 kg)   SpO2 94%   BMI 23.43 kg/m   Wt Readings from Last 3 Encounters:  02/07/21 168 lb (76.2 kg)  01/08/21 172 lb 3.2 oz (78.1 kg)  11/07/20 171 lb (77.6 kg)    Physical Exam Vitals and nursing note reviewed.  Constitutional:      General:  He is not in acute distress.    Appearance: He is well-developed. He is not diaphoretic.  Eyes:     General: No scleral icterus.    Conjunctiva/sclera: Conjunctivae normal.  Neck:     Thyroid: No thyromegaly.  Cardiovascular:     Rate and Rhythm: Normal rate and regular rhythm.     Heart sounds: Normal heart sounds. No murmur heard. Pulmonary:     Effort: Pulmonary effort is normal. No respiratory distress.     Breath sounds: Normal breath sounds. No wheezing.  Musculoskeletal:        General: Normal range of motion.     Cervical back: Neck supple.  Lymphadenopathy:     Cervical: No cervical adenopathy.  Skin:    General: Skin is warm and dry.     Findings: No rash.  Neurological:     Mental Status: He is alert and oriented to person, place, and time.     Coordination: Coordination normal.  Psychiatric:        Behavior: Behavior normal.      Assessment & Plan:   Problem List Items Addressed This Visit       Cardiovascular and Mediastinum   Hypertension associated with diabetes ([place])     Respiratory   COPD (chronic obstructive pulmonary disease) ([place])     Endocrine   Type 2 diabetes mellitus with other specified complication ([facility]) - Primary   Relevant Orders   Bayer DCA Hb A1c Waived     Other   HLD (hyperlipidemia)    A1c looks good at 6.4, no change medication, follow-up in 3 to 4 months Follow up plan: Return if symptoms worsen or fail to improve, for 3 to 54-month diabetes and hypertension cholesterol recheck.  Counseling provided for all of the vaccine components Orders Placed This Encounter  Procedures   Bayer [place] Hb A1c [place] Khristen Cheyney, MD [facility] 02/07/2021, 10:42 AM

## 2021-02-07 NOTE — Addendum Note (Signed)
Addended by: Alphonzo Dublin on: 02/07/2021 11:39 AM   Modules accepted: Orders

## 2021-02-16 ENCOUNTER — Encounter: Payer: Self-pay | Admitting: Gastroenterology

## 2021-02-17 ENCOUNTER — Other Ambulatory Visit (HOSPITAL_COMMUNITY)
Admission: RE | Admit: 2021-02-17 | Discharge: 2021-02-17 | Disposition: A | Discharge status: Home / Self Care | Payer: Medicare HMO | Source: Ambulatory Visit | Attending: Cardiovascular Disease | Admitting: Cardiovascular Disease

## 2021-02-17 ENCOUNTER — Other Ambulatory Visit: Payer: Self-pay

## 2021-02-17 DIAGNOSIS — I10 Essential (primary) hypertension: Secondary | ICD-10-CM | POA: Insufficient documentation

## 2021-02-17 DIAGNOSIS — F17200 Nicotine dependence, unspecified, uncomplicated: Secondary | ICD-10-CM

## 2021-02-17 DIAGNOSIS — I2510 Atherosclerotic heart disease of native coronary artery without angina pectoris: Secondary | ICD-10-CM

## 2021-02-17 DIAGNOSIS — R69 Illness, unspecified: Secondary | ICD-10-CM | POA: Diagnosis not present

## 2021-02-17 LAB — BASIC METABOLIC PANEL
Anion gap: 7 (ref 5–15)
BUN: 12 mg/dL (ref 8–23)
CO2: 25 mmol/L (ref 22–32)
Calcium: 9.2 mg/dL (ref 8.9–10.3)
Chloride: 102 mmol/L (ref 98–111)
Creatinine, Ser: 0.65 mg/dL (ref 0.61–1.24)
GFR, Estimated: >60 mL/min (ref >60)
Glucose, Bld: 118 mg/dL — ABNORMAL HIGH (ref 70–99)
Potassium: 4.2 mmol/L (ref 3.5–5.1)
Sodium: 134 mmol/L — ABNORMAL LOW (ref 135–145)

## 2021-02-24 ENCOUNTER — Other Ambulatory Visit: Payer: Self-pay

## 2021-02-24 ENCOUNTER — Ambulatory Visit (HOSPITAL_COMMUNITY)
Admission: RE | Admit: 2021-02-24 | Discharge: 2021-02-24 | Disposition: A | Discharge status: Home / Self Care | Payer: Medicare HMO | Source: Ambulatory Visit | Attending: Cardiovascular Disease | Admitting: Cardiovascular Disease

## 2021-02-24 DIAGNOSIS — R69 Illness, unspecified: Secondary | ICD-10-CM | POA: Diagnosis not present

## 2021-02-24 DIAGNOSIS — I2510 Atherosclerotic heart disease of native coronary artery without angina pectoris: Secondary | ICD-10-CM | POA: Diagnosis not present

## 2021-02-24 DIAGNOSIS — Z122 Encounter for screening for malignant neoplasm of respiratory organs: Secondary | ICD-10-CM | POA: Diagnosis not present

## 2021-02-24 DIAGNOSIS — F17200 Nicotine dependence, unspecified, uncomplicated: Secondary | ICD-10-CM

## 2021-02-24 DIAGNOSIS — F17210 Nicotine dependence, cigarettes, uncomplicated: Secondary | ICD-10-CM | POA: Diagnosis not present

## 2021-02-24 DIAGNOSIS — J439 Emphysema, unspecified: Secondary | ICD-10-CM | POA: Insufficient documentation

## 2021-02-24 DIAGNOSIS — I700 Atherosclerosis of aorta: Secondary | ICD-10-CM | POA: Insufficient documentation

## 2021-04-02 ENCOUNTER — Encounter: Payer: Self-pay | Admitting: Family Medicine

## 2021-04-02 ENCOUNTER — Ambulatory Visit (INDEPENDENT_AMBULATORY_CARE_PROVIDER_SITE_OTHER): Payer: Medicare HMO | Admitting: Family Medicine

## 2021-04-02 DIAGNOSIS — J329 Chronic sinusitis, unspecified: Secondary | ICD-10-CM

## 2021-04-02 DIAGNOSIS — J40 Bronchitis, not specified as acute or chronic: Secondary | ICD-10-CM | POA: Diagnosis not present

## 2021-04-02 MED ORDER — PSEUDOEPHEDRINE-GUAIFENESIN ER 60-600 MG PO TB12: 1.0000 | ORAL_TABLET | Freq: Two times a day (BID) | ORAL | 0 refills | Status: AC

## 2021-04-02 MED ORDER — AZITHROMYCIN 250 MG PO TABS: ORAL_TABLET | ORAL | 0 refills | Status: DC

## 2021-04-02 NOTE — Progress Notes (Signed)
Subjective:    Patient ID: Charles Castro, male    DOB: [birth year], 75 y.o.   MRN: [id number]   HPI: Charles Castro is a 75 y.o. male presenting for Symptoms include congestion, facial pain, nasal congestion,  productive cough, post nasal drip and sinus pressure. There is no fever, chills, or sweats. Onset of symptoms was a week ago, gradually worsening since that time. Maybe a little dyspneic. Recurs when the weather changes. Covid was positive a month ago. Negative 6 days later. Hasn't tested this time.     Depression screen [facility] 2/9 02/07/2021 11/07/2020 10/14/2020 08/08/2020 05/08/2020  Decreased Interest 3 0 0 0 0  Down, Depressed, Hopeless 0 0 0 0 0  PHQ - 2 Score 3 0 0 0 0  Altered sleeping 0 - - - -  Tired, decreased energy 0 - - - -  Change in appetite 0 - - - -  Feeling bad or failure about yourself  0 - - - -  Trouble concentrating 0 - - - -  Moving slowly or fidgety/restless 0 - - - -  Suicidal thoughts 0 - - - -  PHQ-9 Score 3 - - - -     Relevant past medical, surgical, family and social history reviewed and updated as indicated.  Interim medical history since our last visit reviewed. Allergies and medications reviewed and updated.  ROS:  Review of Systems   Social History   Tobacco Use  Smoking Status Former   Packs/day: 0.50   Years: 30.00   Pack years: 15.00   Types: Cigarettes   Start date: 05/15/1968   Quit date: 06/10/2019   Years since quitting: 1.8  Smokeless Tobacco Never       Objective:     Wt Readings from Last 3 Encounters:  02/07/21 168 lb (76.2 kg)  01/08/21 172 lb 3.2 oz (78.1 kg)  11/07/20 171 lb (77.6 kg)     Exam deferred. Pt. Harboring due to COVID 19. Phone visit performed.   Assessment & Plan:   1. Sinobronchitis     Meds ordered this encounter  Medications   azithromycin (ZITHROMAX Z-PAK) 250 MG tablet    Sig: Take two right away Then one a day for the next 4 days.    Dispense:  6 each    Refill:  0    pseudoephedrine-guaifenesin (MUCINEX D) 60-600 MG 12 hr tablet    Sig: Take 1 tablet by mouth every 12 (twelve) hours for 14 days. As needed for congestion    Dispense:  20 tablet    Refill:  0    Pt. Will do a home Covid test and contact the office with results.      Diagnoses and all orders for this visit:  Sinobronchitis  Other orders -     azithromycin (ZITHROMAX Z-PAK) 250 MG tablet; Take two right away Then one a day for the next 4 days. -     pseudoephedrine-guaifenesin (MUCINEX D) 60-600 MG 12 hr tablet; Take 1 tablet by mouth every 12 (twelve) hours for 14 days. As needed for congestion   Virtual Visit via telephone Note  I discussed the limitations, risks, security and privacy concerns of performing an evaluation and management service by telephone and the availability of in person appointments. The patient was identified with two identifiers. Pt.expressed understanding and agreed to proceed. Pt. Is at home. Dr. Livia Snellen is in his office.  Follow Up Instructions:   I discussed the assessment and  treatment plan with the patient. The patient was provided an opportunity to ask questions and all were answered. The patient agreed with the plan and demonstrated an understanding of the instructions.   The patient was advised to call back or seek an in-person evaluation if the symptoms worsen or if the condition fails to improve as anticipated.   Total minutes including chart review and phone contact time: 13   Follow up plan: Return if symptoms worsen or fail to improve.  Charles Fraise, MD [place]

## 2021-06-04 ENCOUNTER — Other Ambulatory Visit: Payer: Self-pay | Admitting: Family Medicine

## 2021-06-09 ENCOUNTER — Encounter: Payer: Self-pay | Admitting: Family Medicine

## 2021-06-09 ENCOUNTER — Other Ambulatory Visit: Payer: Self-pay

## 2021-06-09 ENCOUNTER — Ambulatory Visit (INDEPENDENT_AMBULATORY_CARE_PROVIDER_SITE_OTHER): Payer: Medicare HMO | Admitting: Family Medicine

## 2021-06-09 VITALS — BP 148/58 | HR 65 | Ht 71.0 in | Wt 164.0 lb

## 2021-06-09 DIAGNOSIS — Z23 Encounter for immunization: Secondary | ICD-10-CM

## 2021-06-09 DIAGNOSIS — E782 Mixed hyperlipidemia: Secondary | ICD-10-CM

## 2021-06-09 DIAGNOSIS — E1169 Type 2 diabetes mellitus with other specified complication: Secondary | ICD-10-CM

## 2021-06-09 DIAGNOSIS — I152 Hypertension secondary to endocrine disorders: Secondary | ICD-10-CM

## 2021-06-09 DIAGNOSIS — E1159 Type 2 diabetes mellitus with other circulatory complications: Secondary | ICD-10-CM

## 2021-06-09 DIAGNOSIS — Z125 Encounter for screening for malignant neoplasm of prostate: Secondary | ICD-10-CM | POA: Diagnosis not present

## 2021-06-09 LAB — BAYER DCA HB A1C WAIVED: HB A1C (BAYER DCA - WAIVED): 5.5 % (ref 4.8–5.6)

## 2021-06-09 MED ORDER — METFORMIN HCL ER 500 MG PO TB24
500.0000 mg | ORAL_TABLET | Freq: Two times a day (BID) | ORAL | 3 refills | Status: DC
Start: 2021-06-09 — End: 2021-12-08

## 2021-06-09 MED ORDER — CLOPIDOGREL BISULFATE 75 MG PO TABS: 75.0000 mg | ORAL_TABLET | Freq: Every day | ORAL | 3 refills | Status: DC

## 2021-06-09 MED ORDER — LISINOPRIL 2.5 MG PO TABS
2.5000 mg | ORAL_TABLET | Freq: Every day | ORAL | 3 refills | Status: DC
Start: 2021-06-09 — End: 2022-06-11

## 2021-06-09 MED ORDER — PANTOPRAZOLE SODIUM 20 MG PO TBEC: 20.0000 mg | DELAYED_RELEASE_TABLET | Freq: Every day | ORAL | 3 refills | Status: DC

## 2021-06-09 MED ORDER — ATORVASTATIN CALCIUM 80 MG PO TABS: 80.0000 mg | ORAL_TABLET | Freq: Every day | ORAL | 3 refills | Status: DC

## 2021-06-09 NOTE — Progress Notes (Signed)
BP (!) 148/58   Pulse 65   Ht 5' 11" (1.803 m)   Wt 164 lb (74.4 kg)   SpO2 97%   BMI 22.87 kg/m    Subjective:   Patient ID: Charles Castro, male    DOB: June 10, 1946, 75 y.o.   MRN: [id number]  HPI: Charles Castro is a 75 y.o. male presenting on 06/09/2021 for Medical Management of Chronic Issues and Diabetes   HPI Type 2 diabetes mellitus Patient comes in today for recheck of his diabetes. Patient has been currently taking metformin and glipizide and A1c is 5.5 today. Patient is currently on an ACE inhibitor/ARB. Patient has seen an ophthalmologist this year. Patient denies any issues with their feet. The symptom started onset as an adult hypertension and hyperlipidemia ARE RELATED TO DM   Hypertension Patient is currently on lisinopril, and their blood pressure today is 148/58. Patient denies any lightheadedness or dizziness. Patient denies headaches, blurred vision, chest pains, shortness of breath, or weakness. Denies any side effects from medication and is content with current medication.   Hyperlipidemia Patient is coming in for recheck of his hyperlipidemia. The patient is currently taking atorvastatin. They deny any issues with myalgias or history of liver damage from it. They deny any focal numbness or weakness or chest pain.   Relevant past medical, surgical, family and social history reviewed and updated as indicated. Interim medical history since our last visit reviewed. Allergies and medications reviewed and updated.  Review of Systems  Constitutional:  Negative for chills and fever.  Eyes:  Negative for visual disturbance.  Respiratory:  Negative for shortness of breath and wheezing.   Cardiovascular:  Negative for chest pain and leg swelling.  Musculoskeletal:  Negative for back pain and gait problem.  Skin:  Negative for rash.  Neurological:  Negative for dizziness, weakness and light-headedness.  All other systems reviewed and are negative.  Per HPI unless  specifically indicated above   Allergies as of 06/09/2021   No Known Allergies      Medication List        Accurate as of June 09, 2021  9:39 AM. If you have any questions, ask your nurse or doctor.          STOP taking these medications    azithromycin 250 MG tablet Commonly known as: Zithromax Z-Pak Stopped by: Fransisca Kaufmann Dettinger, MD   glipiZIDE 5 MG 24 hr tablet Commonly known as: GLUCOTROL XL Stopped by: Fransisca Kaufmann Dettinger, MD       TAKE these medications    acetaminophen 325 MG tablet Commonly known as: TYLENOL Take 650 mg by mouth as needed.   aspirin 81 MG EC tablet Take 1 tablet (81 mg total) by mouth daily.   atorvastatin 80 MG tablet Commonly known as: LIPITOR Take 1 tablet (80 mg total) by mouth daily.   clopidogrel 75 MG tablet Commonly known as: PLAVIX Take 1 tablet (75 mg total) by mouth daily.   Fish Oil 1000 MG Caps Take by mouth.   Fluticasone-Salmeterol 250-50 MCG/DOSE Aepb Commonly known as: Advair Diskus Inhale 1 puff into the lungs 2 (two) times daily.   lisinopril 2.5 MG tablet Commonly known as: ZESTRIL Take 1 tablet (2.5 mg total) by mouth daily.   metFORMIN 500 MG 24 hr tablet Commonly known as: GLUCOPHAGE-XR Take 1 tablet (500 mg total) by mouth in the morning and at bedtime.   nitroGLYCERIN 0.4 MG SL tablet Commonly known as: NITROSTAT Place 1 tablet (  0.4 mg total) under the tongue every 5 (five) minutes x 3 doses as needed for chest pain.   OneTouch Ultra test strip Generic drug: glucose blood Use as instructed   pantoprazole 20 MG tablet Commonly known as: PROTONIX Take 1 tablet (20 mg total) by mouth daily.         Objective:   BP (!) 148/58   Pulse 65   Ht 5' 11" (1.803 m)   Wt 164 lb (74.4 kg)   SpO2 97%   BMI 22.87 kg/m   Wt Readings from Last 3 Encounters:  06/09/21 164 lb (74.4 kg)  02/07/21 168 lb (76.2 kg)  01/08/21 172 lb 3.2 oz (78.1 kg)    Physical Exam Vitals and nursing note  reviewed.  Constitutional:      General: He is not in acute distress.    Appearance: He is well-developed. He is not diaphoretic.  Eyes:     General: No scleral icterus.    Conjunctiva/sclera: Conjunctivae normal.  Neck:     Thyroid: No thyromegaly.  Cardiovascular:     Rate and Rhythm: Normal rate and regular rhythm.     Heart sounds: Normal heart sounds. No murmur heard. Pulmonary:     Effort: Pulmonary effort is normal. No respiratory distress.     Breath sounds: Normal breath sounds. No wheezing.  Musculoskeletal:        General: No swelling. Normal range of motion.     Cervical back: Neck supple.  Lymphadenopathy:     Cervical: No cervical adenopathy.  Skin:    General: Skin is warm and dry.     Findings: No rash.  Neurological:     Mental Status: He is alert and oriented to person, place, and time.     Coordination: Coordination normal.  Psychiatric:        Behavior: Behavior normal.      Assessment & Plan:   Problem List Items Addressed This Visit       Cardiovascular and Mediastinum   Hypertension associated with diabetes ([place])   Relevant Medications   atorvastatin (LIPITOR) 80 MG tablet   clopidogrel (PLAVIX) 75 MG tablet   lisinopril (ZESTRIL) 2.5 MG tablet   metFORMIN (GLUCOPHAGE-XR) 500 MG 24 hr tablet   Other Relevant Orders   CBC with Differential/Platelet   CMP14+EGFR   Lipid panel   Bayer DCA Hb A1c Waived   PSA, total and free     Endocrine   Type 2 diabetes mellitus with other specified complication (HCC) - Primary   Relevant Medications   atorvastatin (LIPITOR) 80 MG tablet   lisinopril (ZESTRIL) 2.5 MG tablet   metFORMIN (GLUCOPHAGE-XR) 500 MG 24 hr tablet   Other Relevant Orders   CBC with Differential/Platelet   CMP14+EGFR   Lipid panel   Bayer DCA Hb A1c Waived   PSA, total and free     Other   HLD (hyperlipidemia)   Relevant Medications   atorvastatin (LIPITOR) 80 MG tablet   lisinopril (ZESTRIL) 2.5 MG tablet   Other  Relevant Orders   CBC with Differential/Platelet   CMP14+EGFR   Lipid panel   Bayer DCA Hb A1c Waived   PSA, total and free   Other Visit Diagnoses     Need for immunization against influenza       Relevant Orders   Flu Vaccine QUAD High Dose(Fluad) (Completed)       A1c looks good at 5.5, will stop glipizide, continue other medicine, keep close eye on the  blood pressure, allowing some permissive hypertension because of age. Follow up plan: Return in about 6 months (around 12/07/2021), or if symptoms worsen or fail to improve, for Diabetes and hypertension.  Counseling provided for all of the vaccine components Orders Placed This Encounter  Procedures   Flu Vaccine QUAD High Dose(Fluad)   CBC with Differential/Platelet   CMP14+EGFR   Lipid panel   Bayer DCA Hb A1c Waived   PSA, total and free    Caryl Pina, MD [facility] 06/09/2021, 9:39 AM

## 2021-06-10 LAB — CBC WITH DIFFERENTIAL/PLATELET
Basophils Absolute: 0.1 10*3/uL (ref 0.0–0.2)
Basos: 1 %
EOS (ABSOLUTE): 0.9 10*3/uL — ABNORMAL HIGH (ref 0.0–0.4)
Eos: 10 %
Hematocrit: 35.5 % — ABNORMAL LOW (ref 37.5–51.0)
Hemoglobin: 11.2 g/dL — ABNORMAL LOW (ref 13.0–17.7)
Immature Grans (Abs): 0 10*3/uL (ref 0.0–0.1)
Immature Granulocytes: 0 %
Lymphocytes Absolute: 1.8 10*3/uL (ref 0.7–3.1)
Lymphs: 21 %
MCH: 26 pg — ABNORMAL LOW (ref 26.6–33.0)
MCHC: 31.5 g/dL (ref 31.5–35.7)
MCV: 82 fL (ref 79–97)
Monocytes Absolute: 0.6 10*3/uL (ref 0.1–0.9)
Monocytes: 7 %
Neutrophils Absolute: 5.5 10*3/uL (ref 1.4–7.0)
Neutrophils: 61 %
Platelets: 302 10*3/uL (ref 150–450)
RBC: 4.31 x10E6/uL (ref 4.14–5.80)
RDW: 16 % — ABNORMAL HIGH (ref 11.6–15.4)
WBC: 8.9 10*3/uL (ref 3.4–10.8)

## 2021-06-10 LAB — LIPID PANEL
Chol/HDL Ratio: 2.1 ratio (ref 0.0–5.0)
Cholesterol, Total: 85 mg/dL — ABNORMAL LOW (ref 100–199)
HDL: 40 mg/dL (ref >39)
LDL Chol Calc (NIH): 32 mg/dL (ref 0–99)
Triglycerides: 55 mg/dL (ref 0–149)
VLDL Cholesterol Cal: 13 mg/dL (ref 5–40)

## 2021-06-10 LAB — CMP14+EGFR
ALT: 15 IU/L (ref 0–44)
AST: 17 IU/L (ref 0–40)
Albumin/Globulin Ratio: 1.8 (ref 1.2–2.2)
Albumin: 4.2 g/dL (ref 3.7–4.7)
Alkaline Phosphatase: 94 IU/L (ref 44–121)
BUN/Creatinine Ratio: 19 (ref 10–24)
BUN: 13 mg/dL (ref 8–27)
Bilirubin Total: 0.2 mg/dL (ref 0.0–1.2)
CO2: 25 mmol/L (ref 20–29)
Calcium: 9.3 mg/dL (ref 8.6–10.2)
Chloride: 103 mmol/L (ref 96–106)
Creatinine, Ser: 0.69 mg/dL — ABNORMAL LOW (ref 0.76–1.27)
Globulin, Total: 2.3 g/dL (ref 1.5–4.5)
Glucose: 111 mg/dL — ABNORMAL HIGH (ref 70–99)
Potassium: 5.2 mmol/L (ref 3.5–5.2)
Sodium: 138 mmol/L (ref 134–144)
Total Protein: 6.5 g/dL (ref 6.0–8.5)
eGFR: 98 mL/min/{1.73_m2} (ref >59)

## 2021-06-10 LAB — PSA, TOTAL AND FREE
PSA, Free Pct: 35 %
PSA, Free: 0.21 ng/mL
Prostate Specific Ag, Serum: 0.6 ng/mL (ref 0.0–4.0)

## 2021-07-15 ENCOUNTER — Encounter: Payer: Self-pay | Admitting: Family Medicine

## 2021-07-15 ENCOUNTER — Telehealth: Payer: Self-pay | Admitting: Family Medicine

## 2021-07-15 ENCOUNTER — Ambulatory Visit (INDEPENDENT_AMBULATORY_CARE_PROVIDER_SITE_OTHER): Payer: Medicare HMO | Admitting: Family Medicine

## 2021-07-15 VITALS — BP 152/76 | HR 85 | Temp 97.5°F | Ht 71.0 in | Wt 157.5 lb

## 2021-07-15 DIAGNOSIS — R040 Epistaxis: Secondary | ICD-10-CM

## 2021-07-15 LAB — HEMOGLOBIN, FINGERSTICK: Hemoglobin: 12.2 g/dL — ABNORMAL LOW (ref 12.6–17.7)

## 2021-07-15 NOTE — Progress Notes (Signed)
Acute Office Visit  Subjective:    Patient ID: Charles Castro, male    DOB: September 21, 1945, 75 y.o.   MRN: [id number]  Chief Complaint  Patient presents with   Epistaxis    HPI Patient is in today for nosebleeds. This has occurred the last 3 nights around 3:30 am. This is occurring from both sides. He reports that it will bleed for a few hours each time. He has tried pinching his nose without improvement. They only thing that will stop the bleeding is to go outside in the cold. He has been feeling lightheaded. He does take Plavix. He denies URI symptoms.   Past Medical History:  Diagnosis Date   Anemia    Colon polyp    COPD (chronic obstructive pulmonary disease) (HCC)    Diverticulosis    DM type 2 with diabetic dyslipidemia (HCC)    HTN (hypertension)    MI (myocardial infarction) ([place]) 05/28/2016   DES to RCA   Nicotine dependence    STEMI (ST elevation myocardial infarction) ([place])    Type 2 diabetes mellitus ([place])     Past Surgical History:  Procedure Laterality Date   CARDIAC CATHETERIZATION N/A 05/28/2016   Procedure: Left Heart Cath and Coronary Angiography;  Surgeon: Nelva Bush, MD;  Location: [facility];  Service: Cardiovascular;  Laterality: N/A;   CARDIAC CATHETERIZATION N/A 05/28/2016   Procedure: Coronary Stent Intervention;  Surgeon: Nelva Bush, MD;  Location: [facility];  Service: Cardiovascular;  Laterality: N/A;    Family History  Problem Relation Age of Onset   CAD Father         with  multiple MI, first at age 41   Diabetes Father    CAD Brother        First Mi at age 68,   Diabetes Brother    Colon cancer Mother        in her 33s    Diabetes Sister     Social History   Socioeconomic History   Marital status: Widowed    Spouse name: Not on file   Number of children: 1   Years of education: Not on file   Highest education level: High school graduate  Occupational History   Occupation: Retired    Comment: 2008   Tobacco Use   Smoking status: Former    Packs/day: 0.50    Years: 30.00    Pack years: 15.00    Types: Cigarettes    Start date: 05/15/1968    Quit date: 06/10/2019    Years since quitting: 2.0   Smokeless tobacco: Never  Vaping Use   Vaping Use: Never used  Substance and Sexual Activity   Alcohol use: Not Currently   Drug use: No   Sexual activity: Not Currently    Birth control/protection: None  Other Topics Concern   Not on file  Social History Narrative   Not on file   Social Determinants of Health   Financial Resource Strain: Low Risk    Difficulty of Paying Living Expenses: Not hard at all  Food Insecurity: No Food Insecurity   Worried About Charity fundraiser in the Last Year: Never true   [place] in the Last Year: Never true  Transportation Needs: No Transportation Needs   Lack of Transportation (Medical): No   Lack of Transportation (Non-Medical): No  Physical Activity: Not on file  Stress: No Stress Concern Present   Feeling of Stress : Only  a little  Social Connections: Moderately Isolated   Frequency of Communication with Friends and Family: More than three times a week   Frequency of Social Gatherings with Friends and Family: More than three times a week   Attends Religious Services: 1 to 4 times per year   Active Member of [employer] or Organizations: No   Attends Archivist Meetings: Never   Marital Status: Widowed  Intimate Partner Violence: Not on file    Outpatient Medications Prior to Visit  Medication Sig Dispense Refill   acetaminophen (TYLENOL) 325 MG tablet Take 650 mg by mouth as needed.     aspirin EC 81 MG EC tablet Take 1 tablet (81 mg total) by mouth daily. 30 tablet 11   atorvastatin (LIPITOR) 80 MG tablet Take 1 tablet (80 mg total) by mouth daily. 90 tablet 3   clopidogrel (PLAVIX) 75 MG tablet Take 1 tablet (75 mg total) by mouth daily. 90 tablet 3   Fluticasone-Salmeterol (ADVAIR DISKUS) 250-50 MCG/DOSE AEPB Inhale 1  puff into the lungs 2 (two) times daily. 1 each 3   glucose blood (ONETOUCH ULTRA) test strip Use as instructed 100 each 12   metFORMIN (GLUCOPHAGE-XR) 500 MG 24 hr tablet Take 1 tablet (500 mg total) by mouth in the morning and at bedtime. 180 tablet 3   nitroGLYCERIN (NITROSTAT) 0.4 MG SL tablet Place 1 tablet (0.4 mg total) under the tongue every 5 (five) minutes x 3 doses as needed for chest pain. 25 tablet 3   Omega-3 Fatty Acids (FISH OIL) 1000 MG CAPS Take by mouth.     pantoprazole (PROTONIX) 20 MG tablet Take 1 tablet (20 mg total) by mouth daily. 90 tablet 3   lisinopril (ZESTRIL) 2.5 MG tablet Take 1 tablet (2.5 mg total) by mouth daily. (Patient not taking: Reported on 07/15/2021) 90 tablet 3   No facility-administered medications prior to visit.    No Known Allergies  Review of Systems As per HPI.    Objective:    Physical Exam Vitals and nursing note reviewed.  Constitutional:      General: He is not in acute distress.    Appearance: He is not ill-appearing, toxic-appearing or diaphoretic.  HENT:     Head: Normocephalic and atraumatic.     Nose:     Right Nostril: Epistaxis (dried blood) present.     Left Nostril: Epistaxis (dried blood) present.     Mouth/Throat:     Mouth: Mucous membranes are moist.     Pharynx: Oropharynx is clear.  Pulmonary:     Effort: Pulmonary effort is normal. No respiratory distress.  Skin:    General: Skin is warm and dry.  Neurological:     Mental Status: He is alert and oriented to person, place, and time.  Psychiatric:        Mood and Affect: Mood normal.        Behavior: Behavior normal.    BP (!) 152/76    Pulse 85    Temp (!) 97.5 F (36.4 C) (Temporal)    Ht 5' 11" (1.803 m)    Wt 157 lb 8 oz (71.4 kg)    BMI 21.97 kg/m  Wt Readings from Last 3 Encounters:  07/15/21 157 lb 8 oz (71.4 kg)  06/09/21 164 lb (74.4 kg)  02/07/21 168 lb (76.2 kg)    There are no preventive care reminders to display for this  patient.  There are no preventive care reminders to display for this  patient.   Lab Results  Component Value Date   TSH 3.456 05/28/2016   Lab Results  Component Value Date   WBC 8.9 06/09/2021   HGB 11.2 (L) 06/09/2021   HCT 35.5 (L) 06/09/2021   MCV 82 06/09/2021   PLT 302 06/09/2021   Lab Results  Component Value Date   NA 138 06/09/2021   K 5.2 06/09/2021   CO2 25 06/09/2021   GLUCOSE 111 (H) 06/09/2021   BUN 13 06/09/2021   CREATININE 0.69 (L) 06/09/2021   BILITOT 0.2 06/09/2021   ALKPHOS 94 06/09/2021   AST 17 06/09/2021   ALT 15 06/09/2021   PROT 6.5 06/09/2021   ALBUMIN 4.2 06/09/2021   CALCIUM 9.3 06/09/2021   ANIONGAP 7 02/17/2021   EGFR 98 06/09/2021   Lab Results  Component Value Date   CHOL 85 (L) 06/09/2021   Lab Results  Component Value Date   HDL 40 06/09/2021   Lab Results  Component Value Date   LDLCALC 32 06/09/2021   Lab Results  Component Value Date   TRIG 55 06/09/2021   Lab Results  Component Value Date   CHOLHDL 2.1 06/09/2021   Lab Results  Component Value Date   HGBA1C 5.5 06/09/2021       Assessment & Plan:   Charles Castro was seen today for epistaxis.  Diagnoses and all orders for this visit:  Epistaxis Hgb 12.2 today. Unable to visualize lesion today. Currently not bleeding. Discussed nasal saline, humidifier, pinching nose for bleeding. Discussed urgent referral to ENT if symptoms worsen or persist. Return to office for new or worsening symptoms, or if symptoms persist.  -     Hemoglobin, fingerstick  The patient indicates understanding of these issues and agrees with the plan.  Gwenlyn Perking, FNP

## 2021-07-15 NOTE — Patient Instructions (Signed)
Nosebleed, Adult A nosebleed is when blood comes out of the nose. Nosebleeds are common and can be caused by many things. They are usually not a sign of a serious medical problem. Follow these instructions at home: When you have a nosebleed:  Sit down. Tilt your head a little forward. Follow these steps: Pinch your nose with a clean towel or tissue. Keep pinching your nose for 5 minutes. Do not let go. After 5 minutes, let go of your nose. If there is still bleeding, do these steps again. Keep doing these steps until the bleeding stops. Do not put tissues or other things in your nose to stop the bleeding. Avoid lying down or putting your head back. Use a nose spray decongestant as told by your doctor. After a nosebleed: Try not to blow your nose or sniffle for several hours. Try not to strain, lift, or bend at the waist for several days. Aspirin and blood-thinning medicines make bleeding more likely. If you take these medicines: Ask your doctor if you should stop taking them or if you should change how much you take. Do not stop taking the medicine unless your doctor tells you to. If your nosebleed was caused by dryness, use over-the-counter saline nasal spray or gel and humidifier as told by your doctor. This will keep the inside of your nose moist and allow it to heal. If you need to use one of these products: Choose one that is water-soluble. Use only as much as you need and use it only as often as needed. Do not lie down right away after you use it. If you get nosebleeds often, talk with your doctor about treatments. These may include: Nasal cautery. A chemical swab or electrical device is used to lightly burn tiny blood vessels inside the nose. This helps stop or prevent nosebleeds. Nasal packing. A gauze or other material is placed in the nose to keep constant pressure on the bleeding area. Contact a doctor if: You have a fever. You get nosebleeds often. You are getting  nosebleeds more often than usual. You bruise very easily. You have something stuck in your nose. You have bleeding in your mouth. You vomit or cough up brown material. You get a nosebleed after you start a new medicine. Get help right away if: You have a nosebleed after you fall or hurt your head. Your nosebleed does not go away after 20 minutes. You feel dizzy or weak. You have unusual bleeding from other parts of your body. You have unusual bruising on other parts of your body. You get sweaty. You vomit blood. Summary Nosebleeds are common. They are usually not a sign of a serious medical problem. When you have a nosebleed, sit down and tilt your head a little forward. Pinch your nose with a clean tissue for 5 minutes. Use saline spray or saline gel and a humidifier as told by your doctor. Get help right away if your nosebleed does not go away after 20 minutes. This information is not intended to replace advice given to you by your health care provider. Make sure you discuss any questions you have with your health care provider. Document Revised: 05/11/2019 Document Reviewed: 05/11/2019 Elsevier Patient Education  2022 [employer].

## 2021-07-16 NOTE — Telephone Encounter (Signed)
Pt was seen in office 07/15/21

## 2021-07-16 NOTE — Telephone Encounter (Signed)
She can also place ice packs on the nose to slow it down.  But yes if they cannot stop it then he needs to come in so we can take a look at them.

## 2021-09-25 ENCOUNTER — Ambulatory Visit (INDEPENDENT_AMBULATORY_CARE_PROVIDER_SITE_OTHER): Payer: Medicare HMO | Admitting: Family Medicine

## 2021-09-25 ENCOUNTER — Encounter: Payer: Self-pay | Admitting: Family Medicine

## 2021-09-25 VITALS — BP 129/65 | HR 71 | Temp 97.3°F | Ht 71.0 in | Wt 154.6 lb

## 2021-09-25 DIAGNOSIS — J441 Chronic obstructive pulmonary disease with (acute) exacerbation: Secondary | ICD-10-CM

## 2021-09-25 MED ORDER — METHYLPREDNISOLONE 4 MG PO TBPK: ORAL_TABLET | ORAL | 0 refills | Status: DC

## 2021-09-25 MED ORDER — AZITHROMYCIN 250 MG PO TABS: ORAL_TABLET | ORAL | 0 refills | Status: DC

## 2021-09-25 NOTE — Progress Notes (Signed)
? ?Assessment & Plan:  ?1. COPD with acute exacerbation ([place]) ?Education provided on COPD exacerbations. Continue symptom management. ?- azithromycin (ZITHROMAX Z-PAK) 250 MG tablet; Take 2 tablets (500 mg) PO today, then 1 tablet (250 mg) PO daily x4 days.  Dispense: 6 tablet; Refill: 0 ?- methylPREDNISolone (MEDROL DOSEPAK) 4 MG TBPK tablet; Use as directed.  Dispense: 21 each; Refill: 0 ? ? ?Follow up plan: Return if symptoms worsen or fail to improve. ? ?Hendricks Limes, MSN, APRN, FNP-C ?[place] ? ?Subjective:  ? ?Patient ID: Charles Castro, male    DOB: 1945/11/14, 76 y.o.   MRN: [id number] ? ?HPI: ?Charles Castro is a 76 y.o. male presenting on 09/25/2021 for Nasal Congestion (Per pt light-headed, coughing, can't breath, sinus congestion, has been going on for about  2weeks) ? ?Patient complains of cough, head/chest congestion, headache, runny nose, facial pain/pressure, postnasal drainage, shortness of breath, and wheezing. Cough is productive of thicker white sputum; states he never gets up this much. Onset of symptoms was 2 weeks ago, unchanged since that time. He is drinking plenty of fluids. Evaluation to date: none. Treatment to date:  Mucinex, Tylenol Severe Sinus, and Albuterol . He has a history of COPD. He does smoke.  ? ? ?ROS: Negative unless specifically indicated above in HPI.  ? ?Relevant past medical history reviewed and updated as indicated.  ? ?Allergies and medications reviewed and updated. ? ? ?Current Outpatient Medications:  ?  acetaminophen (TYLENOL) 325 MG tablet, Take 650 mg by mouth as needed., Disp: , Rfl:  ?  aspirin EC 81 MG EC tablet, Take 1 tablet (81 mg total) by mouth daily., Disp: 30 tablet, Rfl: 11 ?  atorvastatin (LIPITOR) 80 MG tablet, Take 1 tablet (80 mg total) by mouth daily., Disp: 90 tablet, Rfl: 3 ?  clopidogrel (PLAVIX) 75 MG tablet, Take 1 tablet (75 mg total) by mouth daily., Disp: 90 tablet, Rfl: 3 ?  Fluticasone-Salmeterol (ADVAIR DISKUS)  250-50 MCG/DOSE AEPB, Inhale 1 puff into the lungs 2 (two) times daily., Disp: 1 each, Rfl: 3 ?  glucose blood (ONETOUCH ULTRA) test strip, Use as instructed, Disp: 100 each, Rfl: 12 ?  lisinopril (ZESTRIL) 2.5 MG tablet, Take 1 tablet (2.5 mg total) by mouth daily., Disp: 90 tablet, Rfl: 3 ?  metFORMIN (GLUCOPHAGE-XR) 500 MG 24 hr tablet, Take 1 tablet (500 mg total) by mouth in the morning and at bedtime., Disp: 180 tablet, Rfl: 3 ?  nitroGLYCERIN (NITROSTAT) 0.4 MG SL tablet, Place 1 tablet (0.4 mg total) under the tongue every 5 (five) minutes x 3 doses as needed for chest pain., Disp: 25 tablet, Rfl: 3 ?  Omega-3 Fatty Acids (FISH OIL) 1000 MG CAPS, Take by mouth., Disp: , Rfl:  ?  pantoprazole (PROTONIX) 20 MG tablet, Take 1 tablet (20 mg total) by mouth daily., Disp: 90 tablet, Rfl: 3 ? ?No Known Allergies ? ?Objective:  ? ?BP 129/65   Pulse 71   Temp (!) 97.3 ?F (36.3 ?C) (Temporal)   Ht 5\' 11"  (1.803 m)   Wt 154 lb 9.6 oz (70.1 kg)   SpO2 94%   BMI 21.56 kg/m?   ? ?Physical Exam ?Vitals reviewed.  ?Constitutional:   ?   General: He is not in acute distress. ?   Appearance: Normal appearance. He is not ill-appearing, toxic-appearing or diaphoretic.  ?HENT:  ?   Head: Normocephalic and atraumatic.  ?   Right Ear: Tympanic membrane, ear canal and external ear normal.  There is no impacted cerumen.  ?   Left Ear: Tympanic membrane, ear canal and external ear normal. There is no impacted cerumen.  ?   Nose: Nose normal.  ?   Right Sinus: No maxillary sinus tenderness or frontal sinus tenderness.  ?   Left Sinus: No maxillary sinus tenderness or frontal sinus tenderness.  ?   Mouth/Throat:  ?   Mouth: Mucous membranes are moist.  ?   Pharynx: Oropharynx is clear. No oropharyngeal exudate or posterior oropharyngeal erythema.  ?   Tonsils: No tonsillar exudate or tonsillar abscesses.  ?Eyes:  ?   General: No scleral icterus.    ?   Right eye: No discharge.     ?   Left eye: No discharge.  ?    Conjunctiva/sclera: Conjunctivae normal.  ?Cardiovascular:  ?   Rate and Rhythm: Normal rate.  ?   Heart sounds: Normal heart sounds.  ?Pulmonary:  ?   Effort: Pulmonary effort is normal. No respiratory distress.  ?   Breath sounds: Wheezing and rhonchi present. No rales.  ?Musculoskeletal:     ?   General: Normal range of motion.  ?   Cervical back: Normal range of motion.  ?Lymphadenopathy:  ?   Cervical: No cervical adenopathy.  ?Skin: ?   General: Skin is warm and dry.  ?Neurological:  ?   Mental Status: He is alert and oriented to person, place, and time. Mental status is at baseline.  ?Psychiatric:     ?   Mood and Affect: Mood normal.     ?   Behavior: Behavior normal.     ?   Thought Content: Thought content normal.     ?   Judgment: Judgment normal.  ? ? ? ? ? ? ?

## 2021-10-16 ENCOUNTER — Ambulatory Visit (INDEPENDENT_AMBULATORY_CARE_PROVIDER_SITE_OTHER): Payer: Medicare HMO

## 2021-10-16 VITALS — Wt 154.0 lb

## 2021-10-16 DIAGNOSIS — Z0000 Encounter for general adult medical examination without abnormal findings: Secondary | ICD-10-CM

## 2021-10-16 NOTE — Progress Notes (Signed)
? ?Subjective:  ? Charles Castro is a 76 y.o. male who presents for Medicare Annual/Subsequent preventive examination. ? ?Virtual Visit via Telephone Note ? ?I connected with  Charles Castro on 10/16/21 at  2:45 PM EDT by telephone and verified that I am speaking with the correct person using two identifiers. ? ?Location: ?Patient: Home ?Provider: [facility] ?Persons participating in the virtual visit: patient/Nurse Health Advisor ?  ?I discussed the limitations, risks, security and privacy concerns of performing an evaluation and management service by telephone and the availability of in person appointments. The patient expressed understanding and agreed to proceed. ? ?Interactive audio and video telecommunications were attempted between this nurse and patient, however failed, due to patient having technical difficulties OR patient did not have access to video capability.  We continued and completed visit with audio only. ? ?Some vital signs may be absent or patient reported.  ? ?Charles Castro Charles Ano, LPN  ? ?Review of Systems    ? ?Cardiac Risk Factors include: advanced age (>8mn, >>62women);diabetes mellitus;male gender;dyslipidemia;hypertension;sedentary lifestyle;smoking/ tobacco exposure;Other (see comment), Risk factor comments: hx of MI, CAD, COPD ? ?   ?Objective:  ?  ?Today's Vitals  ? 10/16/21 1447  ?Weight: 154 lb (69.9 kg)  ? ?Body mass index is 21.48 kg/m?. ? ? ?  10/16/2021  ?  3:00 PM 10/14/2020  ?  4:19 PM 08/10/2019  ?  1:38 PM 04/26/2018  ? 12:48 PM 04/26/2018  ?  9:26 AM 05/30/2016  ?  9:00 AM  ?Advanced Directives  ?Does Patient Have a Medical Advance Directive? Yes Yes Yes No No No  ?Type of [place]of [place] will;Out of facility DNR (pink MOST or yellow form) [place] will     ?Does patient want to make changes to medical advance directive?  No - Patient declined No - Patient declined     ?Copy of [place]? No - copy requested No - copy requested No - copy requested     ?Would patient like information on creating a medical advance directive?    No - Patient declined No - Patient declined No - patient declined information  ? ? ?Current Medications (verified) ?Outpatient Encounter Medications as of 10/16/2021  ?Medication Sig  ? acetaminophen (TYLENOL) 325 MG tablet Take 650 mg by mouth as needed.  ? aspirin EC 81 MG EC tablet Take 1 tablet (81 mg total) by mouth daily.  ? atorvastatin (LIPITOR) 80 MG tablet Take 1 tablet (80 mg total) by mouth daily.  ? clopidogrel (PLAVIX) 75 MG tablet Take 1 tablet (75 mg total) by mouth daily.  ? Fluticasone-Salmeterol (ADVAIR DISKUS) 250-50 MCG/DOSE AEPB Inhale 1 puff into the lungs 2 (two) times daily.  ? glucose blood (ONETOUCH ULTRA) test strip Use as instructed  ? lisinopril (ZESTRIL) 2.5 MG tablet Take 1 tablet (2.5 mg total) by mouth daily.  ? metFORMIN (GLUCOPHAGE-XR) 500 MG 24 hr tablet Take 1 tablet (500 mg total) by mouth in the morning and at bedtime.  ? nitroGLYCERIN (NITROSTAT) 0.4 MG SL tablet Place 1 tablet (0.4 mg total) under the tongue every 5 (five) minutes x 3 doses as needed for chest pain.  ? Omega-3 Fatty Acids (FISH OIL) 1000 MG CAPS Take by mouth.  ? pantoprazole (PROTONIX) 20 MG tablet Take 1 tablet (20 mg total) by mouth daily.  ? [DISCONTINUED] azithromycin (ZITHROMAX Z-PAK) 250 MG tablet Take 2 tablets (500  mg) PO today, then 1 tablet (250 mg) PO daily x4 days. (Patient not taking: Reported on 10/16/2021)  ? [DISCONTINUED] methylPREDNISolone (MEDROL DOSEPAK) 4 MG TBPK tablet Use as directed. (Patient not taking: Reported on 10/16/2021)  ? ?No facility-administered encounter medications on file as of 10/16/2021.  ? ? ?Allergies (verified) ?Patient has no known allergies.  ? ?History: ?Past Medical History:  ?Diagnosis Date  ? Anemia   ? Colon polyp   ? COPD (chronic obstructive pulmonary disease) ([place])   ? Diverticulosis   ? DM  type 2 with diabetic dyslipidemia ([place])   ? HTN (hypertension)   ? MI (myocardial infarction) ([place]) 05/28/2016  ? DES to RCA  ? Nicotine dependence   ? STEMI (ST elevation myocardial infarction) ([place])   ? Type 2 diabetes mellitus ([place])   ? ?Past Surgical History:  ?Procedure Laterality Date  ? CARDIAC CATHETERIZATION N/A 05/28/2016  ? Procedure: Left Heart Cath and Coronary Angiography;  Surgeon: Nelva Bush, MD;  Location: [facility];  Service: Cardiovascular;  Laterality: N/A;  ? CARDIAC CATHETERIZATION N/A 05/28/2016  ? Procedure: Coronary Stent Intervention;  Surgeon: Nelva Bush, MD;  Location: [facility];  Service: Cardiovascular;  Laterality: N/A;  ? ?Family History  ?Problem Relation Age of Onset  ? CAD Father   ?      with  multiple MI, first at age 81  ? Diabetes Father   ? CAD Brother   ?     First Mi at age 39,  ? Diabetes Brother   ? Colon cancer Mother   ?     in her 25s   ? Diabetes Sister   ? ?Social History  ? ?Socioeconomic History  ? Marital status: Widowed  ?  Spouse name: Not on file  ? Number of children: 1  ? Years of education: Not on file  ? Highest education level: High school graduate  ?Occupational History  ? Occupation: Retired  ?  Comment: 2008  ?Tobacco Use  ? Smoking status: Every Day  ?  Packs/day: 0.50  ?  Years: 30.00  ?  Pack years: 15.00  ?  Types: Cigarettes  ?  Start date: 05/15/1968  ?  Last attempt to quit: 06/10/2019  ?  Years since quitting: 2.3  ? Smokeless tobacco: Never  ? Tobacco comments:  ?  Quit for about a year - now back to smoking about 5 cigarettes per day - trying to quit  ?Vaping Use  ? Vaping Use: Never used  ?Substance and Sexual Activity  ? Alcohol use: Not Currently  ? Drug use: No  ? Sexual activity: Not Currently  ?  Birth control/protection: None  ?Other Topics Concern  ? Not on file  ?Social History Narrative  ? Not on file  ? ?Social Determinants of Health  ? ?Financial Resource Strain: Low Risk   ? Difficulty of Paying Living  Expenses: Not hard at all  ?Food Insecurity: No Food Insecurity  ? Worried About Charity fundraiser in the Last Year: Never true  ? Ran Out of Food in the Last Year: Never true  ?Transportation Needs: No Transportation Needs  ? Lack of Transportation (Medical): No  ? Lack of Transportation (Non-Medical): No  ?Physical Activity: Insufficiently Active  ? Days of Exercise per Week: 4 days  ? Minutes of Exercise per Session: 30 min  ?Stress: No Stress Concern Present  ? Feeling of Stress : Only a little  ?Social Connections: Moderately Integrated  ?  Frequency of Communication with Friends and Family: More than three times a week  ? Frequency of Social Gatherings with Friends and Family: More than three times a week  ? Attends Religious Services: 1 to 4 times per year  ? Active Member of Clubs or Organizations: Yes  ? Attends Archivist Meetings: 1 to 4 times per year  ? Marital Status: Widowed  ? ? ?Tobacco Counseling ?Ready to quit: Not Answered ?Counseling given: Yes ?Tobacco comments: Quit for about a year - now back to smoking about 5 cigarettes per day - trying to quit ? ? ?Clinical Intake: ? ?Pre-visit preparation completed: Yes ? ?Pain : No/denies pain ? ?  ? ?BMI - recorded: 21.48 ?Nutritional Status: BMI of 19-24  Normal ?Nutritional Risks: None ?Diabetes: No ? ?How often do you need to have someone help you when you read instructions, pamphlets, or other written materials from your doctor or pharmacy?: 1 - Never ? ?Diabetic? Nutrition Risk Assessment: ? ?Has the patient had any N/V/D within the last 2 months?  No  ?Does the patient have any non-healing wounds?  No  ?Has the patient had any unintentional weight loss or weight gain?  No  ? ?Diabetes: ? ?Is the patient diabetic?  Yes  ?If diabetic, was a CBG obtained today?  No  ?Did the patient bring in their glucometer from home?  No  ?How often do you monitor your CBG's? Once daily fasting - 167 this am per patient.  ? ?Financial Strains and  Diabetes Management: ? ?Are you having any financial strains with the device, your supplies or your medication? No .  ?Does the patient want to be seen by Chronic Care Management for management of their diabetes?

## 2021-10-16 NOTE — Patient Instructions (Signed)
Charles Castro , ?Thank you for taking time to come for your Medicare Wellness Visit. I appreciate your ongoing commitment to your health goals. Please review the following plan we discussed and let me know if I can assist you in the future.  ? ?Screening recommendations/referrals: ?Colonoscopy: Done 08/05/2017 - repeat in 3 years *patient declined ?Recommended yearly ophthalmology/optometry visit for glaucoma screening and checkup ?Recommended yearly dental visit for hygiene and checkup ? ?Vaccinations: ?Influenza vaccine: Done 06/09/2021 - Repeat annually ?Pneumococcal vaccine: Done 02/04/2015 & 02/15/2018 - ask about [id number] ?Tdap vaccine: Done 02/04/2015 - Repeat in 10 years ?Shingles vaccine: Done 11/07/2020 & 02/07/2021   ?Covid-19: Done 10/11/2019 & 11/08/2019 - + one booster - we need date please. ? ?Advanced directives: Please bring a copy of your health care power of attorney and living will to the office to be added to your chart at your convenience.  ? ?Conditions/risks identified: Aim for 30 minutes of exercise or brisk walking, 6-8 glasses of water, and 5 servings of fruits and vegetables each day.  ? ?Next appointment: Follow up in one year for your annual wellness visit.  ? ?Preventive Care 55 Years and Older, Male ? ?Preventive care refers to lifestyle choices and visits with your health care provider that can promote health and wellness. ?What does preventive care include? ?A yearly physical exam. This is also called an annual well check. ?Dental exams once or twice a year. ?Routine eye exams. Ask your health care provider how often you should have your eyes checked. ?Personal lifestyle choices, including: ?Daily care of your teeth and gums. ?Regular physical activity. ?Eating a healthy diet. ?Avoiding tobacco and drug use. ?Limiting alcohol use. ?Practicing safe sex. ?Taking low doses of aspirin every day. ?Taking vitamin and mineral supplements as recommended by your health care provider. ?What happens  during an annual well check? ?The services and screenings done by your health care provider during your annual well check will depend on your age, overall health, lifestyle risk factors, and family history of disease. ?Counseling  ?Your health care provider may ask you questions about your: ?Alcohol use. ?Tobacco use. ?Drug use. ?Emotional well-being. ?Home and relationship well-being. ?Sexual activity. ?Eating habits. ?History of falls. ?Memory and ability to understand (cognition). ?Work and work Statistician. ?Screening  ?You may have the following tests or measurements: ?Height, weight, and BMI. ?Blood pressure. ?Lipid and cholesterol levels. These may be checked every 5 years, or more frequently if you are over 34 years old. ?Skin check. ?Lung cancer screening. You may have this screening every year starting at age 30 if you have a 30-pack-year history of smoking and currently smoke or have quit within the past 15 years. ?Fecal occult blood test (FOBT) of the stool. You may have this test every year starting at age 59. ?Flexible sigmoidoscopy or colonoscopy. You may have a sigmoidoscopy every 5 years or a colonoscopy every 10 years starting at age 7. ?Prostate cancer screening. Recommendations will vary depending on your family history and other risks. ?Hepatitis C blood test. ?Hepatitis B blood test. ?Sexually transmitted disease (STD) testing. ?Diabetes screening. This is done by checking your blood sugar (glucose) after you have not eaten for a while (fasting). You may have this done every 1-3 years. ?Abdominal aortic aneurysm (AAA) screening. You may need this if you are a current or former smoker. ?Osteoporosis. You may be screened starting at age 86 if you are at high risk. ?Talk with your health care provider about your test results, treatment  options, and if necessary, the need for more tests. ?Vaccines  ?Your health care provider may recommend certain vaccines, such as: ?Influenza vaccine. This is  recommended every year. ?Tetanus, diphtheria, and acellular pertussis (Tdap, Td) vaccine. You may need a Td booster every 10 years. ?Zoster vaccine. You may need this after age 30. ?Pneumococcal 13-valent conjugate (PCV13) vaccine. One dose is recommended after age 43. ?Pneumococcal polysaccharide (PPSV23) vaccine. One dose is recommended after age 49. ?Talk to your health care provider about which screenings and vaccines you need and how often you need them. ?This information is not intended to replace advice given to you by your health care provider. Make sure you discuss any questions you have with your health care provider. ?Document Released: 08/09/2015 Document Revised: 04/01/2016 Document Reviewed: 05/14/2015 ?Elsevier Interactive Patient Education ? 2017 [place]. ? ?Fall Prevention in the Home ?Falls can cause injuries. They can happen to people of all ages. There are many things you can do to make your home safe and to help prevent falls. ?What can I do on the outside of my home? ?Regularly fix the edges of walkways and driveways and fix any cracks. ?Remove anything that might make you trip as you walk through a door, such as a raised step or threshold. ?Trim any bushes or trees on the path to your home. ?Use bright outdoor lighting. ?Clear any walking paths of anything that might make someone trip, such as rocks or tools. ?Regularly check to see if handrails are loose or broken. Make sure that both sides of any steps have handrails. ?Any raised decks and porches should have guardrails on the edges. ?Have any leaves, snow, or ice cleared regularly. ?Use sand or salt on walking paths during winter. ?Clean up any spills in your garage right away. This includes oil or grease spills. ?What can I do in the bathroom? ?Use night lights. ?Install grab bars by the toilet and in the tub and shower. Do not use towel bars as grab bars. ?Use non-skid mats or decals in the tub or shower. ?If you need to sit down in  the shower, use a plastic, non-slip stool. ?Keep the floor dry. Clean up any water that spills on the floor as soon as it happens. ?Remove soap buildup in the tub or shower regularly. ?Attach bath mats securely with double-sided non-slip rug tape. ?Do not have throw rugs and other things on the floor that can make you trip. ?What can I do in the bedroom? ?Use night lights. ?Make sure that you have a light by your bed that is easy to reach. ?Do not use any sheets or blankets that are too big for your bed. They should not hang down onto the floor. ?Have a firm chair that has side arms. You can use this for support while you get dressed. ?Do not have throw rugs and other things on the floor that can make you trip. ?What can I do in the kitchen? ?Clean up any spills right away. ?Avoid walking on wet floors. ?Keep items that you use a lot in easy-to-reach places. ?If you need to reach something above you, use a strong step stool that has a grab bar. ?Keep electrical cords out of the way. ?Do not use floor polish or wax that makes floors slippery. If you must use wax, use non-skid floor wax. ?Do not have throw rugs and other things on the floor that can make you trip. ?What can I do with my stairs? ?Do not  leave any items on the stairs. ?Make sure that there are handrails on both sides of the stairs and use them. Fix handrails that are broken or loose. Make sure that handrails are as long as the stairways. ?Check any carpeting to make sure that it is firmly attached to the stairs. Fix any carpet that is loose or worn. ?Avoid having throw rugs at the top or bottom of the stairs. If you do have throw rugs, attach them to the floor with carpet tape. ?Make sure that you have a light switch at the top of the stairs and the bottom of the stairs. If you do not have them, ask someone to add them for you. ?What else can I do to help prevent falls? ?Wear shoes that: ?Do not have high heels. ?Have rubber bottoms. ?Are comfortable  and fit you well. ?Are closed at the toe. Do not wear sandals. ?If you use a stepladder: ?Make sure that it is fully opened. Do not climb a closed stepladder. ?Make sure that both sides of the stepladder are lo

## 2021-12-08 ENCOUNTER — Ambulatory Visit (INDEPENDENT_AMBULATORY_CARE_PROVIDER_SITE_OTHER): Payer: Medicare HMO | Admitting: Family Medicine

## 2021-12-08 ENCOUNTER — Encounter: Payer: Self-pay | Admitting: Family Medicine

## 2021-12-08 VITALS — BP 144/65 | HR 67 | Ht 71.0 in | Wt 151.0 lb

## 2021-12-08 DIAGNOSIS — J439 Emphysema, unspecified: Secondary | ICD-10-CM

## 2021-12-08 DIAGNOSIS — E1159 Type 2 diabetes mellitus with other circulatory complications: Secondary | ICD-10-CM

## 2021-12-08 DIAGNOSIS — E1169 Type 2 diabetes mellitus with other specified complication: Secondary | ICD-10-CM | POA: Diagnosis not present

## 2021-12-08 DIAGNOSIS — E782 Mixed hyperlipidemia: Secondary | ICD-10-CM

## 2021-12-08 DIAGNOSIS — I152 Hypertension secondary to endocrine disorders: Secondary | ICD-10-CM | POA: Diagnosis not present

## 2021-12-08 LAB — BAYER DCA HB A1C WAIVED: HB A1C (BAYER DCA - WAIVED): 6.8 % — ABNORMAL HIGH (ref 4.8–5.6)

## 2021-12-08 MED ORDER — METFORMIN HCL ER 500 MG PO TB24: 1000.0000 mg | ORAL_TABLET | Freq: Two times a day (BID) | ORAL | 3 refills | Status: DC

## 2021-12-08 MED ORDER — ONETOUCH ULTRA VI STRP: ORAL_STRIP | 12 refills | Status: DC

## 2021-12-08 NOTE — Progress Notes (Signed)
? ?BP (!) 144/65   Pulse 67   Ht 5' 11"  (1.803 m)   Wt 151 lb (68.5 kg)   SpO2 93%   BMI 21.06 kg/m?   ? ?Subjective:  ? ?Patient ID: Charles Castro, male    DOB: 10/22/1945, 76 y.o.   MRN: [id number] ? ?HPI: ?Charles Castro is a 75 y.o. male presenting on 12/08/2021 for Medical Management of Chronic Issues and Diabetes ? ? ?HPI ?Type 2 diabetes mellitus ?Patient comes in today for recheck of his diabetes. Patient has been currently taking metformin. Patient is currently on an ACE inhibitor/ARB. Patient has not seen an ophthalmologist this year. Patient denies any issues with their feet. The symptom started onset as an adult hypertension and hyperlipidemia ARE RELATED TO DM  ? ?Hypertension ?Patient is currently on lisinopril, and their blood pressure today is 144/65 on recheck. Patient denies any lightheadedness or dizziness. Patient denies headaches, blurred vision, chest pains, shortness of breath, or weakness. Denies any side effects from medication and is content with current medication.  ? ?Hyperlipidemia ?Patient is coming in for recheck of his hyperlipidemia. The patient is currently taking fish oils and atorvastatin. They deny any issues with myalgias or history of liver damage from it. They deny any focal numbness or weakness or chest pain.  ? ?COPD ?Patient is coming in for COPD recheck today.  He is currently on Advair.  He has a mild chronic cough but denies any major coughing spells or wheezing spells.  He has 2nighttime symptoms per week and 4daytime symptoms per week currently.  He feels like he is doing a lot better, he did have a rough winter but he says the Advair is helping him and he has been working on his house doing a remodel and he feels like he does pretty well with it and not having too many major issues. ? ?Relevant past medical, surgical, family and social history reviewed and updated as indicated. Interim medical history since our last visit reviewed. ?Allergies and medications  reviewed and updated. ? ?Review of Systems  ?Constitutional:  Negative for chills and fever.  ?Eyes:  Negative for visual disturbance.  ?Respiratory:  Negative for shortness of breath and wheezing.   ?Cardiovascular:  Negative for chest pain and leg swelling.  ?Musculoskeletal:  Negative for back pain and gait problem.  ?Skin:  Negative for rash.  ?Neurological:  Negative for dizziness, weakness and light-headedness.  ?All other systems reviewed and are negative. ? ?Per HPI unless specifically indicated above ? ? ?Allergies as of 12/08/2021   ?No Known Allergies ?  ? ?  ?Medication List  ?  ? ?  ? Accurate as of Dec 08, 2021  9:31 AM. If you have any questions, ask your nurse or doctor.  ?  ?  ? ?  ? ?acetaminophen 325 MG tablet ?Commonly known as: TYLENOL ?Take 650 mg by mouth as needed. ?  ?aspirin 81 MG EC tablet ?Take 1 tablet (81 mg total) by mouth daily. ?  ?atorvastatin 80 MG tablet ?Commonly known as: LIPITOR ?Take 1 tablet (80 mg total) by mouth daily. ?  ?clopidogrel 75 MG tablet ?Commonly known as: PLAVIX ?Take 1 tablet (75 mg total) by mouth daily. ?  ?Fish Oil 1000 MG Caps ?Take by mouth. ?  ?Fluticasone-Salmeterol 250-50 MCG/DOSE Aepb ?Commonly known as: Advair Diskus ?Inhale 1 puff into the lungs 2 (two) times daily. ?  ?lisinopril 2.5 MG tablet ?Commonly known as: ZESTRIL ?Take 1 tablet (2.5 mg total)  by mouth daily. ?  ?metFORMIN 500 MG 24 hr tablet ?Commonly known as: GLUCOPHAGE-XR ?Take 2 tablets (1,000 mg total) by mouth in the morning and at bedtime. ?What changed: how much to take ?Changed by: Worthy Rancher, MD ?  ?nitroGLYCERIN 0.4 MG SL tablet ?Commonly known as: NITROSTAT ?Place 1 tablet (0.4 mg total) under the tongue every 5 (five) minutes x 3 doses as needed for chest pain. ?  ?OneTouch Ultra test strip ?Generic drug: glucose blood ?Use as instructed ?  ?pantoprazole 20 MG tablet ?Commonly known as: PROTONIX ?Take 1 tablet (20 mg total) by mouth daily. ?  ? ?  ? ? ? ?Objective:  ? ?BP  (!) 144/65   Pulse 67   Ht 5' 11"  (1.803 m)   Wt 151 lb (68.5 kg)   SpO2 93%   BMI 21.06 kg/m?   ?Wt Readings from Last 3 Encounters:  ?12/08/21 151 lb (68.5 kg)  ?10/16/21 154 lb (69.9 kg)  ?09/25/21 154 lb 9.6 oz (70.1 kg)  ?  ?Physical Exam ?Vitals and nursing note reviewed.  ?Constitutional:   ?   General: He is not in acute distress. ?   Appearance: He is well-developed. He is not diaphoretic.  ?Eyes:  ?   General: No scleral icterus. ?   Conjunctiva/sclera: Conjunctivae normal.  ?Neck:  ?   Thyroid: No thyromegaly.  ?Cardiovascular:  ?   Rate and Rhythm: Normal rate and regular rhythm.  ?   Heart sounds: Normal heart sounds. No murmur heard. ?Pulmonary:  ?   Effort: Pulmonary effort is normal. No respiratory distress.  ?   Breath sounds: Wheezing (End expiratory wheezes on both sides) present.  ?Musculoskeletal:     ?   General: No swelling. Normal range of motion.  ?   Cervical back: Neck supple.  ?Lymphadenopathy:  ?   Cervical: No cervical adenopathy.  ?Skin: ?   General: Skin is warm and dry.  ?   Findings: No rash.  ?Neurological:  ?   Mental Status: He is alert and oriented to person, place, and time.  ?   Coordination: Coordination normal.  ?Psychiatric:     ?   Behavior: Behavior normal.  ? ? ? ? ?Assessment & Plan:  ? ?Problem List Items Addressed This Visit   ? ?  ? Cardiovascular and Mediastinum  ? Hypertension associated with diabetes ([facility])  ? Relevant Medications  ? metFORMIN (GLUCOPHAGE-XR) 500 MG 24 hr tablet  ? Other Relevant Orders  ? CBC with Differential/Platelet  ? CMP14+EGFR  ? Lipid panel  ? Bayer DCA Hb A1c Waived  ?  ? Respiratory  ? COPD (chronic obstructive pulmonary disease) ([place])  ?  ? Endocrine  ? Type 2 diabetes mellitus with other specified complication ([place]) - Primary  ? Relevant Medications  ? glucose blood (ONETOUCH ULTRA) test strip  ? metFORMIN (GLUCOPHAGE-XR) 500 MG 24 hr tablet  ? Other Relevant Orders  ? CBC with Differential/Platelet  ? CMP14+EGFR  ? Lipid panel  ?  Bayer DCA Hb A1c Waived  ?  ? Other  ? HLD (hyperlipidemia)  ? Relevant Orders  ? CBC with Differential/Platelet  ? CMP14+EGFR  ? Lipid panel  ? Bayer DCA Hb A1c Waived  ?A1c up slightly at 6.8, will increase metformin to 1000 mg twice daily on the days of the is going to eat more carbohydrates for dinner but other than that continue current medicine. ? ?Blood pressure is decent for age, allowing permissive hypertension.  No changes. ? ?Follow up plan: ?Return in about 3 months (around 03/10/2022), or if symptoms worsen or fail to improve, for Diabetes and hypertension and cholesterol. ? ?Counseling provided for all of the vaccine components ?Orders Placed This Encounter  ?Procedures  ? CBC with Differential/Platelet  ? CMP14+EGFR  ? Lipid panel  ? Bayer DCA Hb A1c Waived  ? ? ?Caryl Pina, MD ?[place] ?12/08/2021, 9:31 AM ? ? ? ? ?

## 2021-12-09 LAB — CMP14+EGFR
ALT: 22 IU/L (ref 0–44)
AST: 24 IU/L (ref 0–40)
Albumin/Globulin Ratio: 1.8 (ref 1.2–2.2)
Albumin: 4.3 g/dL (ref 3.7–4.7)
Alkaline Phosphatase: 88 IU/L (ref 44–121)
BUN/Creatinine Ratio: 18 (ref 10–24)
BUN: 13 mg/dL (ref 8–27)
Bilirubin Total: 0.3 mg/dL (ref 0.0–1.2)
CO2: 23 mmol/L (ref 20–29)
Calcium: 9.6 mg/dL (ref 8.6–10.2)
Chloride: 103 mmol/L (ref 96–106)
Creatinine, Ser: 0.74 mg/dL — ABNORMAL LOW (ref 0.76–1.27)
Globulin, Total: 2.4 g/dL (ref 1.5–4.5)
Glucose: 157 mg/dL — ABNORMAL HIGH (ref 70–99)
Potassium: 4.7 mmol/L (ref 3.5–5.2)
Sodium: 139 mmol/L (ref 134–144)
Total Protein: 6.7 g/dL (ref 6.0–8.5)
eGFR: 96 mL/min/{1.73_m2} (ref >59)

## 2021-12-09 LAB — LIPID PANEL
Chol/HDL Ratio: 2.4 ratio (ref 0.0–5.0)
Cholesterol, Total: 97 mg/dL — ABNORMAL LOW (ref 100–199)
HDL: 41 mg/dL (ref >39)
LDL Chol Calc (NIH): 40 mg/dL (ref 0–99)
Triglycerides: 75 mg/dL (ref 0–149)
VLDL Cholesterol Cal: 16 mg/dL (ref 5–40)

## 2021-12-09 LAB — CBC WITH DIFFERENTIAL/PLATELET
Basophils Absolute: 0.1 10*3/uL (ref 0.0–0.2)
Basos: 1 %
EOS (ABSOLUTE): 0.4 10*3/uL (ref 0.0–0.4)
Eos: 6 %
Hematocrit: 34.5 % — ABNORMAL LOW (ref 37.5–51.0)
Hemoglobin: 10.7 g/dL — ABNORMAL LOW (ref 13.0–17.7)
Immature Grans (Abs): 0 10*3/uL (ref 0.0–0.1)
Immature Granulocytes: 0 %
Lymphocytes Absolute: 1.4 10*3/uL (ref 0.7–3.1)
Lymphs: 18 %
MCH: 26.3 pg — ABNORMAL LOW (ref 26.6–33.0)
MCHC: 31 g/dL — ABNORMAL LOW (ref 31.5–35.7)
MCV: 85 fL (ref 79–97)
Monocytes Absolute: 0.5 10*3/uL (ref 0.1–0.9)
Monocytes: 7 %
Neutrophils Absolute: 5.3 10*3/uL (ref 1.4–7.0)
Neutrophils: 68 %
Platelets: 267 10*3/uL (ref 150–450)
RBC: 4.07 x10E6/uL — ABNORMAL LOW (ref 4.14–5.80)
RDW: 14.9 % (ref 11.6–15.4)
WBC: 7.7 10*3/uL (ref 3.4–10.8)

## 2022-03-12 ENCOUNTER — Ambulatory Visit (INDEPENDENT_AMBULATORY_CARE_PROVIDER_SITE_OTHER): Payer: Medicare HMO | Admitting: Family Medicine

## 2022-03-12 ENCOUNTER — Encounter: Payer: Self-pay | Admitting: Family Medicine

## 2022-03-12 VITALS — BP 136/65 | HR 66 | Temp 97.4°F | Ht 71.0 in | Wt 150.0 lb

## 2022-03-12 DIAGNOSIS — E1159 Type 2 diabetes mellitus with other circulatory complications: Secondary | ICD-10-CM | POA: Diagnosis not present

## 2022-03-12 DIAGNOSIS — E782 Mixed hyperlipidemia: Secondary | ICD-10-CM

## 2022-03-12 DIAGNOSIS — E1169 Type 2 diabetes mellitus with other specified complication: Secondary | ICD-10-CM | POA: Diagnosis not present

## 2022-03-12 DIAGNOSIS — I152 Hypertension secondary to endocrine disorders: Secondary | ICD-10-CM | POA: Diagnosis not present

## 2022-03-12 DIAGNOSIS — J439 Emphysema, unspecified: Secondary | ICD-10-CM

## 2022-03-12 DIAGNOSIS — D649 Anemia, unspecified: Secondary | ICD-10-CM

## 2022-03-12 LAB — CBC WITH DIFFERENTIAL/PLATELET
Basophils Absolute: 0.1 10*3/uL (ref 0.0–0.2)
Basos: 1 %
EOS (ABSOLUTE): 1.1 10*3/uL — ABNORMAL HIGH (ref 0.0–0.4)
Eos: 13 %
Hematocrit: 33.9 % — ABNORMAL LOW (ref 37.5–51.0)
Hemoglobin: 11.3 g/dL — ABNORMAL LOW (ref 13.0–17.7)
Immature Grans (Abs): 0 10*3/uL (ref 0.0–0.1)
Immature Granulocytes: 0 %
Lymphocytes Absolute: 1.9 10*3/uL (ref 0.7–3.1)
Lymphs: 22 %
MCH: 27 pg (ref 26.6–33.0)
MCHC: 33.3 g/dL (ref 31.5–35.7)
MCV: 81 fL (ref 79–97)
Monocytes Absolute: 0.5 10*3/uL (ref 0.1–0.9)
Monocytes: 6 %
Neutrophils Absolute: 5 10*3/uL (ref 1.4–7.0)
Neutrophils: 58 %
Platelets: 265 10*3/uL (ref 150–450)
RBC: 4.18 x10E6/uL (ref 4.14–5.80)
RDW: 16.7 % — ABNORMAL HIGH (ref 11.6–15.4)
WBC: 8.6 10*3/uL (ref 3.4–10.8)

## 2022-03-12 LAB — BAYER DCA HB A1C WAIVED: HB A1C (BAYER DCA - WAIVED): 7 % — ABNORMAL HIGH (ref 4.8–5.6)

## 2022-03-12 NOTE — Progress Notes (Signed)
BP 136/65   Pulse 66   Temp (!) 97.4 F (36.3 C)   Ht '5\' 11"'$  (1.803 m)   Wt 150 lb (68 kg)   SpO2 95%   BMI 20.92 kg/m    Subjective:   Patient ID: Charles Castro, male    DOB: [birth year], 76 y.o.   MRN: [id number]  HPI: Charles Castro is a 76 y.o. male presenting on 03/12/2022 for Medical Management of Chronic Issues, Hyperlipidemia, Hypertension, and Diabetes   HPI Type 2 diabetes mellitus Patient comes in today for recheck of his diabetes. Patient has been currently taking metformin. Patient is currently on an ACE inhibitor/ARB. Patient has not seen an ophthalmologist this year. Patient denies any issues with their feet. The symptom started onset as an adult hypertension and hyperlipidemia ARE RELATED TO DM   Hypertension Patient is currently on lisinopril, and their blood pressure today is 136/65. Patient denies any lightheadedness or dizziness. Patient denies headaches, blurred vision, chest pains, shortness of breath, or weakness. Denies any side effects from medication and is content with current medication.   Hyperlipidemia Patient is coming in for recheck of his hyperlipidemia. The patient is currently taking fish oil and atorvastatin. They deny any issues with myalgias or history of liver damage from it. They deny any focal numbness or weakness or chest pain.   COPD Patient is coming in for COPD recheck today.  He is currently on Advair.  He has a mild chronic cough but denies any major coughing spells or wheezing spells.  He has 1 nighttime symptoms per week and 3 daytime symptoms per week currently.  He does get mild wheezing and that is his major symptoms but he says that is normal for him and he is not too concerned about it.  Relevant past medical, surgical, family and social history reviewed and updated as indicated. Interim medical history since our last visit reviewed. Allergies and medications reviewed and updated.  Review of Systems  Constitutional:  Negative  for chills and fever.  Eyes:  Negative for visual disturbance.  Respiratory:  Positive for wheezing. Negative for shortness of breath.   Cardiovascular:  Negative for chest pain and leg swelling.  Musculoskeletal:  Negative for back pain and gait problem.  Skin:  Negative for rash.  Neurological:  Negative for dizziness, weakness and light-headedness.  All other systems reviewed and are negative.   Per HPI unless specifically indicated above   Allergies as of 03/12/2022   No Known Allergies      Medication List        Accurate as of March 12, 2022 10:25 AM. If you have any questions, ask your nurse or doctor.          acetaminophen 325 MG tablet Commonly known as: TYLENOL Take 650 mg by mouth as needed.   aspirin EC 81 MG tablet Take 1 tablet (81 mg total) by mouth daily.   atorvastatin 80 MG tablet Commonly known as: LIPITOR Take 1 tablet (80 mg total) by mouth daily.   clopidogrel 75 MG tablet Commonly known as: PLAVIX Take 1 tablet (75 mg total) by mouth daily.   Fish Oil 1000 MG Caps Take by mouth.   Fluticasone-Salmeterol 250-50 MCG/DOSE Aepb Commonly known as: Advair Diskus Inhale 1 puff into the lungs 2 (two) times daily.   lisinopril 2.5 MG tablet Commonly known as: ZESTRIL Take 1 tablet (2.5 mg total) by mouth daily.   metFORMIN 500 MG 24 hr tablet Commonly known as:  GLUCOPHAGE-XR Take 2 tablets (1,000 mg total) by mouth in the morning and at bedtime.   nitroGLYCERIN 0.4 MG SL tablet Commonly known as: NITROSTAT Place 1 tablet (0.4 mg total) under the tongue every 5 (five) minutes x 3 doses as needed for chest pain.   OneTouch Ultra test strip Generic drug: glucose blood Use as instructed   pantoprazole 20 MG tablet Commonly known as: PROTONIX Take 1 tablet (20 mg total) by mouth daily.         Objective:   BP 136/65   Pulse 66   Temp (!) 97.4 F (36.3 C)   Ht '5\' 11"'$  (1.803 m)   Wt 150 lb (68 kg)   SpO2 95%   BMI 20.92 kg/m    Wt Readings from Last 3 Encounters:  03/12/22 150 lb (68 kg)  12/08/21 151 lb (68.5 kg)  10/16/21 154 lb (69.9 kg)    Physical Exam Vitals and nursing note reviewed.  Constitutional:      General: He is not in acute distress.    Appearance: He is well-developed. He is not diaphoretic.  Eyes:     General: No scleral icterus.    Conjunctiva/sclera: Conjunctivae normal.  Neck:     Thyroid: No thyromegaly.  Cardiovascular:     Rate and Rhythm: Normal rate and regular rhythm.     Heart sounds: Normal heart sounds. No murmur heard. Pulmonary:     Effort: Pulmonary effort is normal. No respiratory distress.     Breath sounds: Normal breath sounds. No wheezing.  Musculoskeletal:        General: No swelling. Normal range of motion.     Cervical back: Neck supple.  Lymphadenopathy:     Cervical: No cervical adenopathy.  Skin:    General: Skin is warm and dry.     Findings: No rash.  Neurological:     Mental Status: He is alert and oriented to person, place, and time.     Coordination: Coordination normal.  Psychiatric:        Behavior: Behavior normal.       Assessment & Plan:   Problem List Items Addressed This Visit       Cardiovascular and Mediastinum   Hypertension associated with diabetes ([place])     Respiratory   COPD (chronic obstructive pulmonary disease) ([place])     Endocrine   Type 2 diabetes mellitus with other specified complication ([place]) - Primary   Relevant Orders   CBC with Differential/Platelet   Bayer DCA Hb A1c Waived     Other   HLD (hyperlipidemia)   Other Visit Diagnoses     Mild anemia           A1c 7.0, slightly more up but stable, continue current medicine and focus on diet.  Recheck iron levels Follow up plan: Return in about 3 months (around 06/12/2022), or if symptoms worsen or fail to improve, for Diabetes recheck.  Counseling provided for all of the vaccine components Orders Placed This Encounter  Procedures   CBC with  Differential/Platelet   Bayer DCA Hb A1c [place], MD [facility] 03/12/2022, 10:25 AM

## 2022-04-20 LAB — HM DIABETES EYE EXAM

## 2022-04-24 NOTE — Progress Notes (Signed)
Cardiology Office Note    Date:  05/04/2022   ID:  Charles Castro, DOB 1945/11/14, MRN [id number]  PCP:  Dettinger, Fransisca Kaufmann, MD  Cardiologist: Charles Rouge, MD    No chief complaint on file.   History of Present Illness:    Charles Castro is a 76 y.o. male with past medical history of CAD (s/p STEMI in 05/2016 with DES to mid-RCA), HTN, HLD, GERD, Type 2 DM, and tobacco use who presents to the office today for follow-up. Seen in [facility] 04/26/18 with atypical chest pain after eating a taco R/O. TTE with normal EF 60% no RWMA;s Done 04/26/18    No angina on plavix Compliant with meds  LDL at goal 37 on statin 11/07/20  A1c improved 6.2   Remodeling his whole house by himself Son is with him 3 days/week and has A farm in [place] new nitro   Still smoking and uses inhaler some  Lung cancer screening CT negative 01/25/20    Past Medical History:  Diagnosis Date   Anemia    Colon polyp    COPD (chronic obstructive pulmonary disease) ([place])    Diverticulosis    DM type 2 with diabetic dyslipidemia ([place])    HTN (hypertension)    MI (myocardial infarction) ([place]) 05/28/2016   DES to RCA   Nicotine dependence    STEMI (ST elevation myocardial infarction) ([place])    Type 2 diabetes mellitus ([place])     Past Surgical History:  Procedure Laterality Date   CARDIAC CATHETERIZATION N/A 05/28/2016   Procedure: Left Heart Cath and Coronary Angiography;  Surgeon: Nelva Bush, MD;  Location: [facility];  Service: Cardiovascular;  Laterality: N/A;   CARDIAC CATHETERIZATION N/A 05/28/2016   Procedure: Coronary Stent Intervention;  Surgeon: Nelva Bush, MD;  Location: [facility];  Service: Cardiovascular;  Laterality: N/A;    Current Medications: Outpatient Medications Prior to Visit  Medication Sig Dispense Refill   acetaminophen (TYLENOL) 325 MG tablet Take 650 mg by mouth as needed.     aspirin EC 81 MG EC tablet Take 1 tablet (81 mg total) by mouth daily. 30  tablet 11   atorvastatin (LIPITOR) 80 MG tablet Take 1 tablet (80 mg total) by mouth daily. 90 tablet 3   clopidogrel (PLAVIX) 75 MG tablet Take 1 tablet (75 mg total) by mouth daily. 90 tablet 3   Fluticasone-Salmeterol (ADVAIR DISKUS) 250-50 MCG/DOSE AEPB Inhale 1 puff into the lungs 2 (two) times daily. 1 each 3   glucose blood (ONETOUCH ULTRA) test strip Use as instructed 100 each 12   lisinopril (ZESTRIL) 2.5 MG tablet Take 1 tablet (2.5 mg total) by mouth daily. 90 tablet 3   metFORMIN (GLUCOPHAGE-XR) 500 MG 24 hr tablet Take 2 tablets (1,000 mg total) by mouth in the morning and at bedtime. 360 tablet 3   nitroGLYCERIN (NITROSTAT) 0.4 MG SL tablet Place 1 tablet (0.4 mg total) under the tongue every 5 (five) minutes x 3 doses as needed for chest pain. 25 tablet 3   Omega-3 Fatty Acids (FISH OIL) 1000 MG CAPS Take by mouth.     pantoprazole (PROTONIX) 20 MG tablet Take 1 tablet (20 mg total) by mouth daily. 90 tablet 3   No facility-administered medications prior to visit.     Allergies:   Patient has no known allergies.   Social History   Socioeconomic History   Marital status: Widowed    Spouse name: Not on  file   Number of children: 1   Years of education: Not on file   Highest education level: High school graduate  Occupational History   Occupation: Retired    Comment: 2008  Tobacco Use   Smoking status: Every Day    Packs/day: 0.50    Years: 30.00    Total pack years: 15.00    Types: Cigarettes    Start date: 05/15/1968    Last attempt to quit: 06/10/2019    Years since quitting: 2.9   Smokeless tobacco: Never   Tobacco comments:    Quit for about a year - now back to smoking about 5 cigarettes per day - trying to quit  Vaping Use   Vaping Use: Never used  Substance and Sexual Activity   Alcohol use: Not Currently   Drug use: No   Sexual activity: Not Currently    Birth control/protection: None  Other Topics Concern   Not on file  Social History Narrative    Not on file   Social Determinants of Health   Financial Resource Strain: Low Risk  (10/16/2021)   Overall Financial Resource Strain (CARDIA)    Difficulty of Paying Living Expenses: Not hard at all  Food Insecurity: No Food Insecurity (10/16/2021)   Hunger Vital Sign    Worried About Running Out of Food in the Last Year: Never true    Ran Out of Food in the Last Year: Never true  Transportation Needs: No Transportation Needs (10/16/2021)   PRAPARE - Hydrologist (Medical): No    Lack of Transportation (Non-Medical): No  Physical Activity: Insufficiently Active (10/16/2021)   Exercise Vital Sign    Days of Exercise per Week: 4 days    Minutes of Exercise per Session: 30 min  Stress: No Stress Concern Present (10/16/2021)   [place]    Feeling of Stress : Only a little  Social Connections: Moderately Integrated (10/16/2021)   Social Connection and Isolation Panel [NHANES]    Frequency of Communication with Friends and Family: More than three times a week    Frequency of Social Gatherings with Friends and Family: More than three times a week    Attends Religious Services: 1 to 4 times per year    Active Member of [employer] or Organizations: Yes    Attends Archivist Meetings: 1 to 4 times per year    Marital Status: Widowed     Family History:  The patient's family history includes CAD in his brother and father; Colon cancer in his mother; Diabetes in his brother, father, and sister.   Review of Systems:   Please see the history of present illness.     General:  No chills, fever, night sweats or weight changes.  Cardiovascular:  No chest pain, dyspnea on exertion, edema, orthopnea, palpitations, paroxysmal nocturnal dyspnea. Positive for chest pain (now resolved).  Dermatological: No rash, lesions/masses Respiratory: No cough, dyspnea Urologic: No hematuria, dysuria Abdominal:   No  nausea, vomiting, diarrhea, bright red blood per rectum, melena, or hematemesis Neurologic:  No visual changes, wkns, changes in mental status. All other systems reviewed and are otherwise negative except as noted above.   Physical Exam:    VS:  BP 136/62   Pulse 69   Ht '5\' 11"'$  (1.803 m)   Wt 149 lb (67.6 kg)   SpO2 97%   BMI 20.78 kg/m    Affect appropriate Healthy:  appears  stated age 7: normal Neck supple with no adenopathy JVP normal  right  bruits no thyromegaly Lungs bilateral exp  wheezing and good diaphragmatic motion Heart:  S1/S2 no murmur, no rub, gallop or click PMI normal Abdomen: benighn, BS positve, no tenderness, no AAA no bruit.  No HSM or HJR Distal pulses intact with no bruits No edema Neuro non-focal Skin warm and dry No muscular weakness   Wt Readings from Last 3 Encounters:  05/04/22 149 lb (67.6 kg)  03/12/22 150 lb (68 kg)  12/08/21 151 lb (68.5 kg)     Studies/Labs Reviewed:   EKG:   SR RBBB old rate 69 05/04/22  Recent Labs: 12/08/2021: ALT 22; BUN 13; Creatinine, Ser 0.74; Potassium 4.7; Sodium 139 03/12/2022: Hemoglobin 11.3; Platelets 265   Lipid Panel    Component Value Date/Time   CHOL 97 (L) 12/08/2021 0901   TRIG 75 12/08/2021 0901   HDL 41 12/08/2021 0901   CHOLHDL 2.4 12/08/2021 0901   CHOLHDL 2.0 06/08/2016 0827   VLDL 11 06/08/2016 0827   LDLCALC 40 12/08/2021 0901    Additional studies/ records that were reviewed today include:   Cardiac Catheterization: 05/2016 Conclusions: Significant one vessel coronary artery disease with 60% proximal and 100% mid RCA stenoses. Mild to moderate, nonobstructive CAD involving the LMCA, LAD, LCx, and distal RCA. Normal left ventricular filling pressure. Successful PCI to proximal and mid RCA with placement of a Promus Premier 3.0 x 38 mm drug-eluting stent (post dilated to 3.5 mm) with 0% residual stenosis and TIMI-3 flow.   Recommendations: Dual antiplatelet therapy with  aspirin and ticagrelor for at least 12 months. Aggressive secondary prevention. Obtain transthoracic echocardiogram in AM for evaluation of LV function. Remove right femoral vein sheath when ACT < 150 seconds.     Echocardiogram: 04/26/2018 Study Conclusions   - Left ventricle: The cavity size was normal. Wall thickness was   increased in a pattern of mild LVH. Systolic function was normal.   The estimated ejection fraction was 60%. Wall motion was normal;   there were no regional wall motion abnormalities. Left   ventricular diastolic function parameters were normal. - Aortic valve: Trileaflet; mildly thickened leaflets.   Plan:   In order of problems listed above:  1. CAD - s/p STEMI in 05/2016 with DES to mid-RCA. Admitted for chest pain 04/26/18  felt to be atypical for a cardiac etiology and ruled-out for ACS. Echo showed a preserved EF of 60% with no regional wall motion abnormalities. On plavix  No angina and active continue medical Rx New nitro called in   2. HTN - Well controlled.  Continue current medications and low sodium Dash type diet.     3. HLD - followed by PCP. Goal LDL is < 70 with known CAD. Remains on Atorvastatin '80mg'$  daily. LDL 36 normal LFTls   4. Tobacco Use - he continues to smoke 0.5 ppd. Cessation advised. No intention of quitting at this time. Lung cancer screening CT  02/25/21 COPD no cancer or nodules will order f/u   5. DM:  Discussed low carb diet.  Target hemoglobin A1c is 6.5 or less.  Continue current medications. A1c improved at 6.2 labs 11/07/20 Most recent A1c 7.0   6. Bruit:  new finding on right f/u duplex US carotids      Lung cancer screening CT smoker  Carotid duplex  F/u in a year     [employer]

## 2022-05-04 ENCOUNTER — Ambulatory Visit: Payer: Medicare HMO | Attending: Cardiovascular Disease | Admitting: Cardiovascular Disease

## 2022-05-04 VITALS — BP 136/62 | HR 69 | Ht 71.0 in | Wt 149.0 lb

## 2022-05-04 DIAGNOSIS — I2510 Atherosclerotic heart disease of native coronary artery without angina pectoris: Secondary | ICD-10-CM | POA: Diagnosis not present

## 2022-05-04 DIAGNOSIS — R0989 Other specified symptoms and signs involving the circulatory and respiratory systems: Secondary | ICD-10-CM | POA: Diagnosis not present

## 2022-05-04 DIAGNOSIS — R69 Illness, unspecified: Secondary | ICD-10-CM | POA: Diagnosis not present

## 2022-05-04 DIAGNOSIS — F17200 Nicotine dependence, unspecified, uncomplicated: Secondary | ICD-10-CM | POA: Diagnosis not present

## 2022-05-04 NOTE — Patient Instructions (Signed)
Medication Instructions:  Your physician recommends that you continue on your current medications as directed. Please refer to the Current Medication list given to you today.   Labwork: None  Testing/Procedures: Your physician has requested that you have a carotid duplex. This test is an ultrasound of the carotid arteries in your neck. It looks at blood flow through these arteries that supply the brain with blood. Allow one hour for this exam. There are no restrictions or special instructions.  Lung Cancer Screening  Follow-Up: Follow up with Dr. Johnsie Cancel in 1 year.   Any Other Special Instructions Will Be Listed Below (If Applicable).     If you need a refill on your cardiac medications before your next appointment, please call your pharmacy.

## 2022-05-14 ENCOUNTER — Ambulatory Visit (HOSPITAL_COMMUNITY)
Admission: RE | Admit: 2022-05-14 | Discharge: 2022-05-14 | Disposition: A | Discharge status: Home / Self Care | Payer: Medicare HMO | Source: Ambulatory Visit | Attending: Cardiovascular Disease | Admitting: Cardiovascular Disease

## 2022-05-14 DIAGNOSIS — R0989 Other specified symptoms and signs involving the circulatory and respiratory systems: Secondary | ICD-10-CM | POA: Insufficient documentation

## 2022-05-14 DIAGNOSIS — I700 Atherosclerosis of aorta: Secondary | ICD-10-CM | POA: Insufficient documentation

## 2022-05-14 DIAGNOSIS — Z122 Encounter for screening for malignant neoplasm of respiratory organs: Secondary | ICD-10-CM | POA: Diagnosis not present

## 2022-05-14 DIAGNOSIS — R69 Illness, unspecified: Secondary | ICD-10-CM | POA: Diagnosis not present

## 2022-05-14 DIAGNOSIS — F17210 Nicotine dependence, cigarettes, uncomplicated: Secondary | ICD-10-CM | POA: Diagnosis not present

## 2022-05-14 DIAGNOSIS — I2510 Atherosclerotic heart disease of native coronary artery without angina pectoris: Secondary | ICD-10-CM | POA: Diagnosis not present

## 2022-05-14 DIAGNOSIS — J439 Emphysema, unspecified: Secondary | ICD-10-CM | POA: Insufficient documentation

## 2022-05-14 DIAGNOSIS — F17200 Nicotine dependence, unspecified, uncomplicated: Secondary | ICD-10-CM | POA: Diagnosis present

## 2022-05-14 DIAGNOSIS — I6523 Occlusion and stenosis of bilateral carotid arteries: Secondary | ICD-10-CM | POA: Diagnosis not present

## 2022-05-20 ENCOUNTER — Encounter: Payer: Self-pay | Admitting: *Deleted

## 2022-05-26 ENCOUNTER — Telehealth: Payer: Self-pay | Admitting: Cardiovascular Disease

## 2022-05-26 NOTE — Telephone Encounter (Signed)
   Pt is returning call to get carotid result. He said to call back his son's instead

## 2022-05-26 NOTE — Telephone Encounter (Signed)
Patient/Son notified and verbalized understanding.   Josue Hector, MD  05/14/2022 11:13 AM EDT     Plaque no stenosis f/u carotid duplex in 2 years

## 2022-06-11 ENCOUNTER — Ambulatory Visit (INDEPENDENT_AMBULATORY_CARE_PROVIDER_SITE_OTHER): Payer: Medicare HMO | Admitting: Family Medicine

## 2022-06-11 ENCOUNTER — Encounter: Payer: Self-pay | Admitting: Family Medicine

## 2022-06-11 VITALS — BP 148/71 | HR 87 | Ht 71.0 in | Wt 143.0 lb

## 2022-06-11 DIAGNOSIS — E782 Mixed hyperlipidemia: Secondary | ICD-10-CM

## 2022-06-11 DIAGNOSIS — J439 Emphysema, unspecified: Secondary | ICD-10-CM

## 2022-06-11 DIAGNOSIS — E1159 Type 2 diabetes mellitus with other circulatory complications: Secondary | ICD-10-CM

## 2022-06-11 DIAGNOSIS — I152 Hypertension secondary to endocrine disorders: Secondary | ICD-10-CM

## 2022-06-11 DIAGNOSIS — E1169 Type 2 diabetes mellitus with other specified complication: Secondary | ICD-10-CM | POA: Diagnosis not present

## 2022-06-11 DIAGNOSIS — Z23 Encounter for immunization: Secondary | ICD-10-CM | POA: Diagnosis not present

## 2022-06-11 LAB — BAYER DCA HB A1C WAIVED: HB A1C (BAYER DCA - WAIVED): 7 % — ABNORMAL HIGH (ref 4.8–5.6)

## 2022-06-11 MED ORDER — ATORVASTATIN CALCIUM 80 MG PO TABS: 80.0000 mg | ORAL_TABLET | Freq: Every day | ORAL | 3 refills | Status: AC

## 2022-06-11 MED ORDER — PANTOPRAZOLE SODIUM 20 MG PO TBEC: 20.0000 mg | DELAYED_RELEASE_TABLET | Freq: Every day | ORAL | 3 refills | Status: AC

## 2022-06-11 MED ORDER — ALBUTEROL SULFATE HFA 108 (90 BASE) MCG/ACT IN AERS: 2.0000 | INHALATION_SPRAY | Freq: Four times a day (QID) | RESPIRATORY_TRACT | 0 refills | Status: DC | PRN

## 2022-06-11 MED ORDER — CLOPIDOGREL BISULFATE 75 MG PO TABS: 75.0000 mg | ORAL_TABLET | Freq: Every day | ORAL | 3 refills | Status: DC

## 2022-06-11 MED ORDER — FLUTICASONE-SALMETEROL 115-21 MCG/ACT IN AERO: 2.0000 | INHALATION_SPRAY | Freq: Two times a day (BID) | RESPIRATORY_TRACT | 3 refills | Status: AC

## 2022-06-11 MED ORDER — LISINOPRIL 2.5 MG PO TABS: 2.5000 mg | ORAL_TABLET | Freq: Every day | ORAL | 3 refills | Status: DC

## 2022-06-11 NOTE — Progress Notes (Signed)
BP (!) 148/71   Pulse 87   Ht _0  (1.803 m)   Wt 143 lb (64.9 kg)   SpO2 (!) 88%   BMI 19.94 kg/m    Subjective:   Patient ID: Charles Castro, male    DOB: 1946/05/05, 76 y.o.   MRN: [id number]  HPI: Charles Castro is a 76 y.o. male presenting on 06/11/2022 for Medical Management of Chronic Issues and Diabetes   HPI Type 2 diabetes mellitus Patient comes in today for recheck of his diabetes. Patient has been currently taking metformin. Patient is currently on an ACE inhibitor/ARB. Patient has seen an ophthalmologist this year. Patient denies any issues with their feet. The symptom started onset as an adult hypertension and hyperlipidemia and CAD ARE RELATED TO DM   Hypertension Patient is currently on lisinopril, and their blood pressure today is 148/71. Patient denies any lightheadedness or dizziness. Patient denies headaches, blurred vision, chest pains, shortness of breath, or weakness. Denies any side effects from medication and is content with current medication.   Hyperlipidemia Patient is coming in for recheck of his hyperlipidemia. The patient is currently taking fish oil and atorvastatin. They deny any issues with myalgias or history of liver damage from it. They deny any focal numbness or weakness or chest pain.   COPD Patient is coming in for COPD recheck today.  He is currently on Advair.  He has a mild chronic cough but denies any major coughing spells or wheezing spells.  He has 2 nighttime symptoms per week and 2 daytime symptoms per week currently.  He does feel like he is having a little bit of congestion over the past week with cold air changes.  Relevant past medical, surgical, family and social history reviewed and updated as indicated. Interim medical history since our last visit reviewed. Allergies and medications reviewed and updated.  Review of Systems  Constitutional:  Negative for chills and fever.  HENT:  Positive for congestion.   Eyes:  Negative for  visual disturbance.  Respiratory:  Positive for cough and wheezing. Negative for chest tightness and shortness of breath.   Cardiovascular:  Negative for chest pain and leg swelling.  Musculoskeletal:  Negative for back pain and gait problem.  Skin:  Negative for rash.  All other systems reviewed and are negative.   Per HPI unless specifically indicated above   Allergies as of 06/11/2022   No Known Allergies      Medication List        Accurate as of June 11, 2022 10:56 AM. If you have any questions, ask your nurse or doctor.          STOP taking these medications    Fluticasone-Salmeterol 250-50 MCG/DOSE Aepb Commonly known as: Advair Diskus Replaced by: fluticasone-salmeterol 115-21 MCG/ACT inhaler Stopped by: Fransisca Kaufmann Dorothye Berni, MD       TAKE these medications    acetaminophen 325 MG tablet Commonly known as: TYLENOL Take 650 mg by mouth as needed.   albuterol 108 (90 Base) MCG/ACT inhaler Commonly known as: VENTOLIN HFA Inhale 2 puffs into the lungs every 6 (six) hours as needed for wheezing or shortness of breath. Started by: Worthy Rancher, MD   aspirin EC 81 MG tablet Take 1 tablet (81 mg total) by mouth daily.   atorvastatin 80 MG tablet Commonly known as: LIPITOR Take 1 tablet (80 mg total) by mouth daily.   clopidogrel 75 MG tablet Commonly known as: PLAVIX Take 1 tablet (75  mg total) by mouth daily.   Fish Oil 1000 MG Caps Take by mouth.   fluticasone-salmeterol 115-21 MCG/ACT inhaler Commonly known as: Advair HFA Inhale 2 puffs into the lungs 2 (two) times daily. Replaces: Fluticasone-Salmeterol 250-50 MCG/DOSE Aepb Started by: Fransisca Kaufmann Henchy Mccauley, MD   lisinopril 2.5 MG tablet Commonly known as: ZESTRIL Take 1 tablet (2.5 mg total) by mouth daily.   metFORMIN 500 MG 24 hr tablet Commonly known as: GLUCOPHAGE-XR Take 2 tablets (1,000 mg total) by mouth in the morning and at bedtime.   nitroGLYCERIN 0.4 MG SL  tablet Commonly known as: NITROSTAT Place 1 tablet (0.4 mg total) under the tongue every 5 (five) minutes x 3 doses as needed for chest pain.   OneTouch Ultra test strip Generic drug: glucose blood Use as instructed   pantoprazole 20 MG tablet Commonly known as: PROTONIX Take 1 tablet (20 mg total) by mouth daily.         Objective:   BP (!) 148/71   Pulse 87   Ht _0  (1.803 m)   Wt 143 lb (64.9 kg)   SpO2 (!) 88%   BMI 19.94 kg/m   Wt Readings from Last 3 Encounters:  06/11/22 143 lb (64.9 kg)  05/04/22 149 lb (67.6 kg)  03/12/22 150 lb (68 kg)    Physical Exam Vitals and nursing note reviewed.  Constitutional:      General: He is not in acute distress.    Appearance: He is well-developed. He is not diaphoretic.  Eyes:     General: No scleral icterus.    Conjunctiva/sclera: Conjunctivae normal.  Neck:     Thyroid: No thyromegaly.  Cardiovascular:     Rate and Rhythm: Normal rate and regular rhythm.     Heart sounds: Normal heart sounds. No murmur heard. Pulmonary:     Effort: Pulmonary effort is normal. No respiratory distress.     Breath sounds: Wheezing and rhonchi present. No rales.  Chest:     Chest wall: No tenderness.  Musculoskeletal:        General: Normal range of motion.     Cervical back: Neck supple.  Lymphadenopathy:     Cervical: No cervical adenopathy.  Skin:    General: Skin is warm and dry.     Findings: No rash.  Neurological:     Mental Status: He is alert and oriented to person, place, and time.     Coordination: Coordination normal.  Psychiatric:        Behavior: Behavior normal.     Results for orders placed or performed in visit on 04/22/22  HM DIABETES EYE EXAM  Result Value Ref Range   HM Diabetic Eye Exam No Retinopathy No Retinopathy    Assessment & Plan:   Problem List Items Addressed This Visit       Cardiovascular and Mediastinum   Hypertension associated with diabetes ([place])   Relevant Medications    atorvastatin (LIPITOR) 80 MG tablet   clopidogrel (PLAVIX) 75 MG tablet   lisinopril (ZESTRIL) 2.5 MG tablet   Other Relevant Orders   CBC with Differential/Platelet   CMP14+EGFR   Lipid panel   Bayer DCA Hb A1c Waived     Respiratory   COPD (chronic obstructive pulmonary disease) ([facility])   Relevant Medications   albuterol (VENTOLIN HFA) 108 (90 Base) MCG/ACT inhaler   fluticasone-salmeterol (ADVAIR HFA) 115-21 MCG/ACT inhaler     Endocrine   Type 2 diabetes mellitus with other specified complication ([place]) - Primary  Relevant Medications   atorvastatin (LIPITOR) 80 MG tablet   lisinopril (ZESTRIL) 2.5 MG tablet   Other Relevant Orders   CBC with Differential/Platelet   CMP14+EGFR   Lipid panel   Bayer DCA Hb A1c Waived   Microalbumin / creatinine urine ratio     Other   HLD (hyperlipidemia)   Relevant Medications   atorvastatin (LIPITOR) 80 MG tablet   lisinopril (ZESTRIL) 2.5 MG tablet   Other Relevant Orders   CBC with Differential/Platelet   CMP14+EGFR   Lipid panel   Bayer DCA Hb A1c Waived    A1c up slightly but still within a decent range at 7.0, will monitor but no medication changes at this point.  Has a small COPD exacerbation, does not have an albuterol inhaler, also recommended Mucinex and uses Advair more consistently.  Allowing permissive hypertension because of age. Follow up plan: Return in about 3 months (around 09/11/2022), or if symptoms worsen or fail to improve, for Diabetes hypertension and COPD recheck.  Counseling provided for all of the vaccine components Orders Placed This Encounter  Procedures   CBC with Differential/Platelet   CMP14+EGFR   Lipid panel   Bayer DCA Hb A1c Waived   Microalbumin / creatinine urine ratio    Caryl Pina, MD [facility] 06/11/2022, 10:56 AM

## 2022-06-12 LAB — CMP14+EGFR
ALT: 19 IU/L (ref 0–44)
AST: 24 IU/L (ref 0–40)
Albumin/Globulin Ratio: 2.2 (ref 1.2–2.2)
Albumin: 4.8 g/dL (ref 3.8–4.8)
Alkaline Phosphatase: 97 IU/L (ref 44–121)
BUN/Creatinine Ratio: 20 (ref 10–24)
BUN: 17 mg/dL (ref 8–27)
Bilirubin Total: 0.6 mg/dL (ref 0.0–1.2)
CO2: 22 mmol/L (ref 20–29)
Calcium: 10.3 mg/dL — ABNORMAL HIGH (ref 8.6–10.2)
Chloride: 96 mmol/L (ref 96–106)
Creatinine, Ser: 0.86 mg/dL (ref 0.76–1.27)
Globulin, Total: 2.2 g/dL (ref 1.5–4.5)
Glucose: 98 mg/dL (ref 70–99)
Potassium: 5.4 mmol/L — ABNORMAL HIGH (ref 3.5–5.2)
Sodium: 134 mmol/L (ref 134–144)
Total Protein: 7 g/dL (ref 6.0–8.5)
eGFR: 91 mL/min/{1.73_m2} (ref >59)

## 2022-06-12 LAB — CBC WITH DIFFERENTIAL/PLATELET
Basophils Absolute: 0.1 10*3/uL (ref 0.0–0.2)
Basos: 1 %
EOS (ABSOLUTE): 1 10*3/uL — ABNORMAL HIGH (ref 0.0–0.4)
Eos: 8 %
Hematocrit: 43.9 % (ref 37.5–51.0)
Hemoglobin: 15 g/dL (ref 13.0–17.7)
Immature Grans (Abs): 0 10*3/uL (ref 0.0–0.1)
Immature Granulocytes: 0 %
Lymphocytes Absolute: 2.3 10*3/uL (ref 0.7–3.1)
Lymphs: 20 %
MCH: 30.5 pg (ref 26.6–33.0)
MCHC: 34.2 g/dL (ref 31.5–35.7)
MCV: 89 fL (ref 79–97)
Monocytes Absolute: 0.7 10*3/uL (ref 0.1–0.9)
Monocytes: 6 %
Neutrophils Absolute: 7.6 10*3/uL — ABNORMAL HIGH (ref 1.4–7.0)
Neutrophils: 65 %
Platelets: 256 10*3/uL (ref 150–450)
RBC: 4.91 x10E6/uL (ref 4.14–5.80)
RDW: 14.6 % (ref 11.6–15.4)
WBC: 11.8 10*3/uL — ABNORMAL HIGH (ref 3.4–10.8)

## 2022-06-12 LAB — LIPID PANEL
Chol/HDL Ratio: 2.6 ratio (ref 0.0–5.0)
Cholesterol, Total: 89 mg/dL — ABNORMAL LOW (ref 100–199)
HDL: 34 mg/dL — ABNORMAL LOW (ref >39)
LDL Chol Calc (NIH): 37 mg/dL (ref 0–99)
Triglycerides: 88 mg/dL (ref 0–149)
VLDL Cholesterol Cal: 18 mg/dL (ref 5–40)

## 2022-06-13 LAB — MICROALBUMIN / CREATININE URINE RATIO
Creatinine, Urine: 92.5 mg/dL
Microalb/Creat Ratio: 16 mg/g creat (ref 0–29)
Microalbumin, Urine: 14.8 ug/mL

## 2022-07-05 ENCOUNTER — Other Ambulatory Visit: Payer: Self-pay | Admitting: Family Medicine

## 2022-07-05 DIAGNOSIS — J439 Emphysema, unspecified: Secondary | ICD-10-CM

## 2022-09-11 ENCOUNTER — Ambulatory Visit (INDEPENDENT_AMBULATORY_CARE_PROVIDER_SITE_OTHER): Payer: Medicare HMO | Admitting: Family Medicine

## 2022-09-11 ENCOUNTER — Encounter: Payer: Self-pay | Admitting: Family Medicine

## 2022-09-11 VITALS — BP 137/86 | HR 63 | Ht 71.0 in | Wt 146.0 lb

## 2022-09-11 DIAGNOSIS — E782 Mixed hyperlipidemia: Secondary | ICD-10-CM

## 2022-09-11 DIAGNOSIS — I152 Hypertension secondary to endocrine disorders: Secondary | ICD-10-CM | POA: Diagnosis not present

## 2022-09-11 DIAGNOSIS — I2510 Atherosclerotic heart disease of native coronary artery without angina pectoris: Secondary | ICD-10-CM | POA: Diagnosis not present

## 2022-09-11 DIAGNOSIS — E1169 Type 2 diabetes mellitus with other specified complication: Secondary | ICD-10-CM | POA: Diagnosis not present

## 2022-09-11 DIAGNOSIS — E1159 Type 2 diabetes mellitus with other circulatory complications: Secondary | ICD-10-CM

## 2022-09-11 LAB — CBC WITH DIFFERENTIAL/PLATELET
Basophils Absolute: 0.1 10*3/uL (ref 0.0–0.2)
Basos: 1 %
EOS (ABSOLUTE): 0.9 10*3/uL — ABNORMAL HIGH (ref 0.0–0.4)
Eos: 9 %
Hematocrit: 40.8 % (ref 37.5–51.0)
Hemoglobin: 13.9 g/dL (ref 13.0–17.7)
Immature Grans (Abs): 0 10*3/uL (ref 0.0–0.1)
Immature Granulocytes: 0 %
Lymphocytes Absolute: 2 10*3/uL (ref 0.7–3.1)
Lymphs: 21 %
MCH: 31.8 pg (ref 26.6–33.0)
MCHC: 34.1 g/dL (ref 31.5–35.7)
MCV: 93 fL (ref 79–97)
Monocytes Absolute: 0.7 10*3/uL (ref 0.1–0.9)
Monocytes: 7 %
Neutrophils Absolute: 5.7 10*3/uL (ref 1.4–7.0)
Neutrophils: 62 %
Platelets: 262 10*3/uL (ref 150–450)
RBC: 4.37 x10E6/uL (ref 4.14–5.80)
RDW: 13.2 % (ref 11.6–15.4)
WBC: 9.3 10*3/uL (ref 3.4–10.8)

## 2022-09-11 LAB — BMP8+EGFR
BUN/Creatinine Ratio: 17 (ref 10–24)
BUN: 12 mg/dL (ref 8–27)
CO2: 23 mmol/L (ref 20–29)
Calcium: 9.5 mg/dL (ref 8.6–10.2)
Chloride: 96 mmol/L (ref 96–106)
Creatinine, Ser: 0.72 mg/dL — ABNORMAL LOW (ref 0.76–1.27)
Glucose: 134 mg/dL — ABNORMAL HIGH (ref 70–99)
Potassium: 4.4 mmol/L (ref 3.5–5.2)
Sodium: 134 mmol/L (ref 134–144)
eGFR: 96 mL/min/{1.73_m2} (ref >59)

## 2022-09-11 LAB — BAYER DCA HB A1C WAIVED: HB A1C (BAYER DCA - WAIVED): 7.7 % — ABNORMAL HIGH (ref 4.8–5.6)

## 2022-09-11 MED ORDER — METFORMIN HCL ER 500 MG PO TB24: 1000.0000 mg | ORAL_TABLET | Freq: Two times a day (BID) | ORAL | 3 refills | Status: DC

## 2022-09-11 NOTE — Progress Notes (Signed)
BP 137/86   Pulse 63   Ht 5' 11"$  (1.803 m)   Wt 146 lb (66.2 kg)   SpO2 91%   BMI 20.36 kg/m    Subjective:   Patient ID: Charles Castro, male    DOB: 03-10-1946, 77 y.o.   MRN: [id number]  HPI: Charles Castro is a 77 y.o. male presenting on 09/11/2022 for Medical Management of Chronic Issues and Diabetes   HPI Type 2 diabetes mellitus Patient comes in today for recheck of his diabetes. Patient has been currently taking metformin. Patient is currently on an ACE inhibitor/ARB. Patient has not seen an ophthalmologist this year. Patient denies any issues with their feet. The symptom started onset as an adult hypertension and hyperlipidemia ARE RELATED TO DM   Hyperlipidemia Patient is coming in for recheck of his hyperlipidemia. The patient is currently taking atorvastatin and fish oil. They deny any issues with myalgias or history of liver damage from it. They deny any focal numbness or weakness or chest pain.   Hypertension Patient is currently on lisinopril, and their blood pressure today is 137/86. Patient denies any lightheadedness or dizziness. Patient denies headaches, blurred vision, chest pains, shortness of breath, or weakness. Denies any side effects from medication and is content with current medication.   COPD Patient is coming in for COPD recheck today.  He is currently on Advair although admits he does not take it all the time and albuterol as needed.  He does have some wheezing but says he feels normal..  He has a mild chronic cough but denies any major coughing spells or wheezing spells.  He has 2 nighttime symptoms per week and 2 daytime symptoms per week currently.   Relevant past medical, surgical, family and social history reviewed and updated as indicated. Interim medical history since our last visit reviewed. Allergies and medications reviewed and updated.  Review of Systems  Constitutional:  Negative for chills and fever.  Eyes:  Negative for visual disturbance.   Respiratory:  Positive for cough and wheezing. Negative for shortness of breath.   Cardiovascular:  Negative for chest pain and leg swelling.  Musculoskeletal:  Negative for back pain and gait problem.  Skin:  Negative for rash.  All other systems reviewed and are negative.   Per HPI unless specifically indicated above   Allergies as of 09/11/2022   No Known Allergies      Medication List        Accurate as of September 11, 2022 10:27 AM. If you have any questions, ask your nurse or doctor.          acetaminophen 325 MG tablet Commonly known as: TYLENOL Take 650 mg by mouth as needed.   albuterol 108 (90 Base) MCG/ACT inhaler Commonly known as: VENTOLIN HFA INHALE 2 PUFFS INTO LUNGS EVERY 6 HOURS AS NEEDED FOR WHEEZING AND FOR SHORTNESS OF BREATH   aspirin EC 81 MG tablet Take 1 tablet (81 mg total) by mouth daily.   atorvastatin 80 MG tablet Commonly known as: LIPITOR Take 1 tablet (80 mg total) by mouth daily.   clopidogrel 75 MG tablet Commonly known as: PLAVIX Take 1 tablet (75 mg total) by mouth daily.   Fish Oil 1000 MG Caps Take by mouth.   fluticasone-salmeterol 115-21 MCG/ACT inhaler Commonly known as: Advair HFA Inhale 2 puffs into the lungs 2 (two) times daily.   lisinopril 2.5 MG tablet Commonly known as: ZESTRIL Take 1 tablet (2.5 mg total) by mouth daily.  metFORMIN 500 MG 24 hr tablet Commonly known as: GLUCOPHAGE-XR Take 2 tablets (1,000 mg total) by mouth in the morning and at bedtime.   nitroGLYCERIN 0.4 MG SL tablet Commonly known as: NITROSTAT Place 1 tablet (0.4 mg total) under the tongue every 5 (five) minutes x 3 doses as needed for chest pain.   OneTouch Ultra test strip Generic drug: glucose blood Use as instructed   pantoprazole 20 MG tablet Commonly known as: PROTONIX Take 1 tablet (20 mg total) by mouth daily.         Objective:   BP 137/86   Pulse 63   Ht 5' 11"$  (1.803 m)   Wt 146 lb (66.2 kg)   SpO2 91%    BMI 20.36 kg/m   Wt Readings from Last 3 Encounters:  09/11/22 146 lb (66.2 kg)  06/11/22 143 lb (64.9 kg)  05/04/22 149 lb (67.6 kg)    Physical Exam Vitals and nursing note reviewed.  Constitutional:      General: He is not in acute distress.    Appearance: He is well-developed. He is not diaphoretic.  Eyes:     General: No scleral icterus.       Right eye: No discharge.     Conjunctiva/sclera: Conjunctivae normal.     Pupils: Pupils are equal, round, and reactive to light.  Neck:     Thyroid: No thyromegaly.  Cardiovascular:     Rate and Rhythm: Normal rate and regular rhythm.     Heart sounds: Normal heart sounds. No murmur heard. Pulmonary:     Effort: Pulmonary effort is normal. No respiratory distress.     Breath sounds: Wheezing (End expiratory wheezes on upper lung) present.  Musculoskeletal:        General: Normal range of motion.     Cervical back: Neck supple.  Lymphadenopathy:     Cervical: No cervical adenopathy.  Skin:    General: Skin is warm and dry.     Findings: No rash.  Neurological:     Mental Status: He is alert and oriented to person, place, and time.     Coordination: Coordination normal.  Psychiatric:        Behavior: Behavior normal.       Assessment & Plan:   Problem List Items Addressed This Visit       Cardiovascular and Mediastinum   Hypertension associated with diabetes ([place])   Relevant Medications   metFORMIN (GLUCOPHAGE-XR) 500 MG 24 hr tablet   Other Relevant Orders   CBC with Differential/Platelet   BMP8+EGFR   Bayer DCA Hb A1c Waived   CAD (coronary artery disease)     Endocrine   Type 2 diabetes mellitus with other specified complication (HCC) - Primary   Relevant Medications   metFORMIN (GLUCOPHAGE-XR) 500 MG 24 hr tablet   Other Relevant Orders   CBC with Differential/Platelet   BMP8+EGFR   Bayer DCA Hb A1c Waived     Other   HLD (hyperlipidemia)   Relevant Orders   CBC with Differential/Platelet    BMP8+EGFR   Bayer DCA Hb A1c Waived    A1c is slightly up at 7.7.  Patient wants to focus on diet for this next cycle and see if he can get down on his own.  If he can get down on his own that we will start more medicine but instructed that if he cannot then we will start him on medicine next time. Follow up plan: Return in about 3 months (around  12/10/2022), or if symptoms worsen or fail to improve, for Diabetes recheck.  Counseling provided for all of the vaccine components Orders Placed This Encounter  Procedures   CBC with Differential/Platelet   BMP8+EGFR   Bayer DCA Hb A1c [place], MD [facility] 09/11/2022, 10:27 AM

## 2022-10-28 ENCOUNTER — Telehealth: Payer: Self-pay | Admitting: Family Medicine

## 2022-10-28 NOTE — Telephone Encounter (Signed)
Contacted Charles Castro to schedule their annual wellness visit. Appointment made for 11/04/2022.  Thank you,  Colletta [place],  [facility] Direct Dial ??[phone]

## 2022-11-04 ENCOUNTER — Ambulatory Visit (INDEPENDENT_AMBULATORY_CARE_PROVIDER_SITE_OTHER): Payer: Medicare HMO

## 2022-11-04 VITALS — Ht 71.0 in | Wt 145.0 lb

## 2022-11-04 DIAGNOSIS — Z0000 Encounter for general adult medical examination without abnormal findings: Secondary | ICD-10-CM | POA: Diagnosis not present

## 2022-11-04 NOTE — Patient Instructions (Signed)
[name] , Thank you for taking time to come for your Medicare Wellness Visit. I appreciate your ongoing commitment to your health goals. Please review the following plan we discussed and let me know if I can assist you in the future.   These are the goals we discussed:  Goals      DIET - INCREASE WATER INTAKE     Try to drink 6-8 glasses of water daily     Increase physical activity        This is a list of the screening recommended for you and due dates:  Health Maintenance  Topic Date Due   COVID-19 Vaccine (3 - 2023-24 season) 03/27/2022   Colon Cancer Screening  12/09/2022*   Hepatitis C Screening: USPSTF Recommendation to screen - Ages 18-79 yo.  06/12/2023*   Flu Shot  02/25/2023   Hemoglobin A1C  03/12/2023   Eye exam for diabetics  04/21/2023   Yearly kidney health urinalysis for diabetes  06/12/2023   Complete foot exam   06/12/2023   Yearly kidney function blood test for diabetes  09/12/2023   Medicare Annual Wellness Visit  11/04/2023   DTaP/Tdap/Td vaccine (2 - Td or Tdap) 02/03/2025   Pneumonia Vaccine  Completed   Zoster (Shingles) Vaccine  Completed   HPV Vaccine  Aged Out  *Topic was postponed. The date shown is not the original due date.    Advanced directives: Advance directive discussed with you today. I have provided a copy for you to complete at home and have notarized. Once this is complete please bring a copy in to our office so we can scan it into your chart.   Conditions/risks identified: Aim for 30 minutes of exercise or brisk walking, 6-8 glasses of water, and 5 servings of fruits and vegetables each day.   Next appointment: Follow up in one year for your annual wellness visit.   Preventive Care 55 Years and Older, Male  Preventive care refers to lifestyle choices and visits with your health care provider that can promote health and wellness. What does preventive care include? A yearly physical exam. This is also called an annual well  check. Dental exams once or twice a year. Routine eye exams. Ask your health care provider how often you should have your eyes checked. Personal lifestyle choices, including: Daily care of your teeth and gums. Regular physical activity. Eating a healthy diet. Avoiding tobacco and drug use. Limiting alcohol use. Practicing safe sex. Taking low doses of aspirin every day. Taking vitamin and mineral supplements as recommended by your health care provider. What happens during an annual well check? The services and screenings done by your health care provider during your annual well check will depend on your age, overall health, lifestyle risk factors, and family history of disease. Counseling  Your health care provider may ask you questions about your: Alcohol use. Tobacco use. Drug use. Emotional well-being. Home and relationship well-being. Sexual activity. Eating habits. History of falls. Memory and ability to understand (cognition). Work and work Astronomer. Screening  You may have the following tests or measurements: Height, weight, and BMI. Blood pressure. Lipid and cholesterol levels. These may be checked every 5 years, or more frequently if you are over 23 years old. Skin check. Lung cancer screening. You may have this screening every year starting at age 75 if you have a 30-pack-year history of smoking and currently smoke or have quit within the past 15 years. Fecal occult blood test (FOBT) of the  stool. You may have this test every year starting at age 50. Flexible sigmoidoscopy or colonoscopy. You may have a sigmoidoscopy every 5 years or a colonoscopy every 10 years starting at age 50. Prostate cancer screening. Recommendations will vary depending on your family history and other risks. Hepatitis C blood test. Hepatitis B blood test. Sexually transmitted disease (STD) testing. Diabetes screening. This is done by checking your blood sugar (glucose) after you have not  eaten for a while (fasting). You may have this done every 1-3 years. Abdominal aortic aneurysm (AAA) screening. You may need this if you are a current or former smoker. Osteoporosis. You may be screened starting at age 70 if you are at high risk. Talk with your health care provider about your test results, treatment options, and if necessary, the need for more tests. Vaccines  Your health care provider may recommend certain vaccines, such as: Influenza vaccine. This is recommended every year. Tetanus, diphtheria, and acellular pertussis (Tdap, Td) vaccine. You may need a Td booster every 10 years. Zoster vaccine. You may need this after age 60. Pneumococcal 13-valent conjugate (PCV13) vaccine. One dose is recommended after age 65. Pneumococcal polysaccharide (PPSV23) vaccine. One dose is recommended after age 65. Talk to your health care provider about which screenings and vaccines you need and how often you need them. This information is not intended to replace advice given to you by your health care provider. Make sure you discuss any questions you have with your health care provider. Document Released: 08/09/2015 Document Revised: 04/01/2016 Document Reviewed: 05/14/2015 Elsevier Interactive Patient Education  2017 [employer]  Fall Prevention in the Home Falls can cause injuries. They can happen to people of all ages. There are many things you can do to make your home safe and to help prevent falls. What can I do on the outside of my home? Regularly fix the edges of walkways and driveways and fix any cracks. Remove anything that might make you trip as you walk through a door, such as a raised step or threshold. Trim any bushes or trees on the path to your home. Use bright outdoor lighting. Clear any walking paths of anything that might make someone trip, such as rocks or tools. Regularly check to see if handrails are loose or broken. Make sure that both sides of any steps have  handrails. Any raised decks and porches should have guardrails on the edges. Have any leaves, snow, or ice cleared regularly. Use sand or salt on walking paths during winter. Clean up any spills in your garage right away. This includes oil or grease spills. What can I do in the bathroom? Use night lights. Install grab bars by the toilet and in the tub and shower. Do not use towel bars as grab bars. Use non-skid mats or decals in the tub or shower. If you need to sit down in the shower, use a plastic, non-slip stool. Keep the floor dry. Clean up any water that spills on the floor as soon as it happens. Remove soap buildup in the tub or shower regularly. Attach bath mats securely with double-sided non-slip rug tape. Do not have throw rugs and other things on the floor that can make you trip. What can I do in the bedroom? Use night lights. Make sure that you have a light by your bed that is easy to reach. Do not use any sheets or blankets that are too big for your bed. They should not hang down onto the floor.   Have a firm chair that has side arms. You can use this for support while you get dressed. Do not have throw rugs and other things on the floor that can make you trip. What can I do in the kitchen? Clean up any spills right away. Avoid walking on wet floors. Keep items that you use a lot in easy-to-reach places. If you need to reach something above you, use a strong step stool that has a grab bar. Keep electrical cords out of the way. Do not use floor polish or wax that makes floors slippery. If you must use wax, use non-skid floor wax. Do not have throw rugs and other things on the floor that can make you trip. What can I do with my stairs? Do not leave any items on the stairs. Make sure that there are handrails on both sides of the stairs and use them. Fix handrails that are broken or loose. Make sure that handrails are as long as the stairways. Check any carpeting to make sure  that it is firmly attached to the stairs. Fix any carpet that is loose or worn. Avoid having throw rugs at the top or bottom of the stairs. If you do have throw rugs, attach them to the floor with carpet tape. Make sure that you have a light switch at the top of the stairs and the bottom of the stairs. If you do not have them, ask someone to add them for you. What else can I do to help prevent falls? Wear shoes that: Do not have high heels. Have rubber bottoms. Are comfortable and fit you well. Are closed at the toe. Do not wear sandals. If you use a stepladder: Make sure that it is fully opened. Do not climb a closed stepladder. Make sure that both sides of the stepladder are locked into place. Ask someone to hold it for you, if possible. Clearly mark and make sure that you can see: Any grab bars or handrails. First and last steps. Where the edge of each step is. Use tools that help you move around (mobility aids) if they are needed. These include: Canes. Walkers. Scooters. Crutches. Turn on the lights when you go into a dark area. Replace any light bulbs as soon as they burn out. Set up your furniture so you have a clear path. Avoid moving your furniture around. If any of your floors are uneven, fix them. If there are any pets around you, be aware of where they are. Review your medicines with your doctor. Some medicines can make you feel dizzy. This can increase your chance of falling. Ask your doctor what other things that you can do to help prevent falls. This information is not intended to replace advice given to you by your health care provider. Make sure you discuss any questions you have with your health care provider. Document Released: 05/09/2009 Document Revised: 12/19/2015 Document Reviewed: 08/17/2014 Elsevier Interactive Patient Education  2017 [employer].

## 2022-11-04 NOTE — Progress Notes (Signed)
Subjective:   Charles Castro is a 77 y.o. male who presents for Medicare Annual/Subsequent preventive examination. I connected with  Charles Castro on 11/04/22 by a audio enabled telemedicine application and verified that I am speaking with the correct person using two identifiers.  Patient Location: Home  Provider Location: Home Office  I discussed the limitations of evaluation and management by telemedicine. The patient expressed understanding and agreed to proceed. Cardiac Risk Factors include: advanced age (>11men, >58 women);hypertension;male gender;dyslipidemia;diabetes mellitus     Objective:    Today's Vitals   11/04/22 1357  Weight: 145 lb (65.8 kg)  Height: 5\' 11"  (1.803 m)   Body mass index is 20.22 kg/m.     11/04/2022    2:01 PM 10/16/2021    3:00 PM 10/14/2020    4:19 PM 08/10/2019    1:38 PM 04/26/2018   12:48 PM 04/26/2018    9:26 AM 05/30/2016    9:00 AM  Advanced Directives  Does Patient Have a Medical Advance Directive? No Yes Yes Yes No No No  Type of Special educational needs teacher of [place];Living will Healthcare Power of [place];Living will;Out of facility DNR (pink MOST or yellow form) Healthcare Power of [place];Living will     Does patient want to make changes to medical advance directive?   No - Patient declined No - Patient declined     Copy of Healthcare Power of Attorney in Chart?  No - copy requested No - copy requested No - copy requested     Would patient like information on creating a medical advance directive? No - Patient declined    No - Patient declined No - Patient declined No - patient declined information    Current Medications (verified) Outpatient Encounter Medications as of 11/04/2022  Medication Sig   acetaminophen (TYLENOL) 325 MG tablet Take 650 mg by mouth as needed.   albuterol (VENTOLIN HFA) 108 (90 Base) MCG/ACT inhaler INHALE 2 PUFFS INTO LUNGS EVERY 6 HOURS AS NEEDED FOR WHEEZING AND FOR SHORTNESS OF BREATH   aspirin EC  81 MG EC tablet Take 1 tablet (81 mg total) by mouth daily.   atorvastatin (LIPITOR) 80 MG tablet Take 1 tablet (80 mg total) by mouth daily.   clopidogrel (PLAVIX) 75 MG tablet Take 1 tablet (75 mg total) by mouth daily.   fluticasone-salmeterol (ADVAIR HFA) 115-21 MCG/ACT inhaler Inhale 2 puffs into the lungs 2 (two) times daily.   glucose blood (ONETOUCH ULTRA) test strip Use as instructed   lisinopril (ZESTRIL) 2.5 MG tablet Take 1 tablet (2.5 mg total) by mouth daily.   metFORMIN (GLUCOPHAGE-XR) 500 MG 24 hr tablet Take 2 tablets (1,000 mg total) by mouth in the morning and at bedtime.   nitroGLYCERIN (NITROSTAT) 0.4 MG SL tablet Place 1 tablet (0.4 mg total) under the tongue every 5 (five) minutes x 3 doses as needed for chest pain.   Omega-3 Fatty Acids (FISH OIL) 1000 MG CAPS Take by mouth.   pantoprazole (PROTONIX) 20 MG tablet Take 1 tablet (20 mg total) by mouth daily. (Patient not taking: Reported on 11/04/2022)   No facility-administered encounter medications on file as of 11/04/2022.    Allergies (verified) Patient has no known allergies.   History: Past Medical History:  Diagnosis Date   Anemia    Colon polyp    COPD (chronic obstructive pulmonary disease)    Diverticulosis    DM type 2 with diabetic dyslipidemia    HTN (hypertension)  MI (myocardial infarction) 05/28/2016   DES to RCA   Nicotine dependence    STEMI (ST elevation myocardial infarction)    Type 2 diabetes mellitus    Past Surgical History:  Procedure Laterality Date   CARDIAC CATHETERIZATION N/A 05/28/2016   Procedure: Left Heart Cath and Coronary Angiography;  Surgeon: Yvonne Kendall, MD;  [facility]: [facility] INVASIVE CV LAB;  Service: Cardiovascular;  Laterality: N/A;   CARDIAC CATHETERIZATION N/A 05/28/2016   Procedure: Coronary Stent Intervention;  Surgeon: Yvonne Kendall, MD;  Location: [facility] INVASIVE CV LAB;  Service: Cardiovascular;  Laterality: N/A;   Family History  Problem Relation Age of Onset    CAD Father         with  multiple MI, first at age 36   Diabetes Father    CAD Brother        First Mi at age 62,   Diabetes Brother    Colon cancer Mother        in her 19s    Diabetes Sister    Social History   Socioeconomic History   Marital status: Widowed    Spouse name: Not on file   Number of children: 1   Years of education: Not on file   Highest education level: High school graduate  Occupational History   Occupation: Retired    Comment: 2008  Tobacco Use   Smoking status: Every Day    Packs/day: 0.50    Years: 30.00    Additional pack years: 0.00    Total pack years: 15.00    Types: Cigarettes    Start date: 05/15/1968    Last attempt to quit: 06/10/2019    Years since quitting: 3.4   Smokeless tobacco: Never   Tobacco comments:    Quit for about a year - now back to smoking about 5 cigarettes per day - trying to quit  Vaping Use   Vaping Use: Never used  Substance and Sexual Activity   Alcohol use: Not Currently   Drug use: No   Sexual activity: Not Currently    Birth control/protection: None  Other Topics Concern   Not on file  Social History Narrative   Not on file   Social Determinants of Health   Financial Resource Strain: Low Risk  (11/04/2022)   Overall Financial Resource Strain (CARDIA)    Difficulty of Paying Living Expenses: Not hard at all  Food Insecurity: No Food Insecurity (11/04/2022)   Hunger Vital Sign    Worried About Running Out of Food in the Last Year: Never true    Ran Out of Food in the Last Year: Never true  Transportation Needs: No Transportation Needs (11/04/2022)   PRAPARE - Administrator, Civil Service (Medical): No    Lack of Transportation (Non-Medical): No  Physical Activity: Insufficiently Active (11/04/2022)   Exercise Vital Sign    Days of Exercise per Week: 3 days    Minutes of Exercise per Session: 30 min  Stress: No Stress Concern Present (11/04/2022)   [employer] of Occupational Health -  Occupational Stress Questionnaire    Feeling of Stress : Not at all  Social Connections: Socially Isolated (11/04/2022)   Social Connection and Isolation Panel [NHANES]    Frequency of Communication with Friends and Family: More than three times a week    Frequency of Social Gatherings with Friends and Family: More than three times a week    Attends Religious Services: Never    Production manager of [employer]  or Organizations: No    Attends Banker Meetings: Never    Marital Status: Widowed    Tobacco Counseling Ready to quit: Not Answered Counseling given: Not Answered Tobacco comments: Quit for about a year - now back to smoking about 5 cigarettes per day - trying to quit   Clinical Intake:  Pre-visit preparation completed: Yes  Pain : No/denies pain     Nutritional Risks: None Diabetes: Yes CBG done?: No Did pt. bring in CBG monitor from home?: No  How often do you need to have someone help you when you read instructions, pamphlets, or other written materials from your doctor or pharmacy?: 1 - Never  Diabetic?yes  Nutrition Risk Assessment:  Has the patient had any N/V/D within the last 2 months?  No  Does the patient have any non-healing wounds?  No  Has the patient had any unintentional weight loss or weight gain?  No   Diabetes:  Is the patient diabetic?  Yes  If diabetic, was a CBG obtained today?  No  Did the patient bring in their glucometer from home?  No  How often do you monitor your CBG's? Once a day .   Financial Strains and Diabetes Management:  Are you having any financial strains with the device, your supplies or your medication? No .  Does the patient want to be seen by Chronic Care Management for management of their diabetes?  No  Would the patient like to be referred to a Nutritionist or for Diabetic Management?  No   Diabetic Exams:  Diabetic Eye Exam: Completed 01/2022 Diabetic Foot Exam: Overdue, Pt has been advised about the  importance in completing this exam. Pt is scheduled for diabetic foot exam on next office visit .   Interpreter Needed?: No  Information entered by :: Renie Ora, LPN   Activities of Daily Living    11/04/2022    2:01 PM  In your present state of health, do you have any difficulty performing the following activities:  Hearing? 0  Vision? 0  Difficulty concentrating or making decisions? 0  Walking or climbing stairs? 0  Dressing or bathing? 0  Doing errands, shopping? 0  Preparing Food and eating ? N  Using the Toilet? N  In the past six months, have you accidently leaked urine? N  Do you have problems with loss of bowel control? N  Managing your Medications? N  Managing your Finances? N  Housekeeping or managing your Housekeeping? N    Patient Care Team: Dettinger, Elige Radon, MD as PCP - General (Family Medicine) Wendall Stade, MD as PCP - Cardiology (Cardiology) Michaelle Copas, MD as Referring Physician (Optometry)  Indicate any recent Medical Services you may have received from other than [facility] providers in the past year (date may be approximate).     Assessment:   This is a routine wellness examination for [place].  Hearing/Vision screen Vision Screening - Comments:: Wears rx glasses - up to date with routine eye exams with  Dr.Lee   Dietary issues and exercise activities discussed: Current Exercise Habits: Home exercise routine, Type of exercise: walking, Time (Minutes): 30, Frequency (Times/Week): 3, Weekly Exercise (Minutes/Week): 90, Intensity: Mild, Exercise limited by: None identified   Goals Addressed             This Visit's Progress    DIET - INCREASE WATER INTAKE   On track    Try to drink 6-8 glasses of water daily  Depression Screen    11/04/2022    2:00 PM 09/11/2022   10:05 AM 06/11/2022   10:33 AM 03/12/2022   10:13 AM 12/08/2021    9:13 AM 10/16/2021    2:58 PM 09/25/2021   11:22 AM  PHQ 2/9 Scores  PHQ - 2 Score 0 0 0 0 0 0 0   PHQ- 9 Score 0 0 0  0 0 0    Fall Risk    11/04/2022    1:58 PM 09/11/2022   10:05 AM 06/11/2022   10:33 AM 03/12/2022   10:13 AM 12/08/2021    9:13 AM  Fall Risk   Falls in the past year? 0 0 0 0 0  Number falls in past yr: 0      Injury with Fall? 0      Risk for fall due to : No Fall Risks      Follow up Falls prevention discussed        FALL RISK PREVENTION PERTAINING TO THE HOME:  Any stairs in or around the home? No  If so, are there any without handrails? No  Home free of loose throw rugs in walkways, pet beds, electrical cords, etc? Yes  Adequate lighting in your home to reduce risk of falls? Yes   ASSISTIVE DEVICES UTILIZED TO PREVENT FALLS:  Life alert? No  Use of a cane, walker or w/c? No  Grab bars in the bathroom? Yes  Shower chair or bench in shower? Yes  Elevated toilet seat or a handicapped toilet? Yes          11/04/2022    2:01 PM 10/14/2020    4:14 PM 08/10/2019    1:44 PM  6CIT Screen  What Year? 0 points 0 points 0 points  What month? 0 points 0 points 0 points  What time? 0 points 0 points 0 points  Count back from 20 0 points 0 points 0 points  Months in reverse 0 points 0 points 4 points  Repeat phrase 0 points 0 points 4 points  Total Score 0 points 0 points 8 points    Immunizations Immunization History  Administered Date(s) Administered   Fluad Quad(high Dose 65+) 05/12/2017, 05/17/2018, 05/04/2019, 05/08/2020, 06/09/2021, 06/11/2022   Influenza, High Dose Seasonal PF 05/07/2015, 05/12/2017, 05/17/2018   Influenza, Seasonal, Injecte, Preservative Fre 05/31/2016   Influenza,inj,Quad PF,6+ Mos 05/31/2016   Influenza,trivalent, recombinat, inj, PF 05/01/2014   Moderna Sars-Covid-2 Vaccination 10/11/2019, 11/08/2019   Pneumococcal Conjugate-13 08/19/2016, 02/15/2018   Pneumococcal Polysaccharide-23 02/04/2015   Tdap 02/04/2015   Zoster Recombinat (Shingrix) 11/07/2020, 02/07/2021    TDAP status: Up to date  Flu Vaccine status: Up to  date  Pneumococcal vaccine status: Up to date  Covid-19 vaccine status: Completed vaccines  Qualifies for Shingles Vaccine? Yes   Zostavax completed Yes   Shingrix Completed?: Yes  Screening Tests Health Maintenance  Topic Date Due   COVID-19 Vaccine (3 - 2023-24 season) 03/27/2022   COLONOSCOPY (Pts 45-71yrs Insurance coverage will need to be confirmed)  12/09/2022 (Originally 08/05/2020)   Hepatitis C Screening  06/12/2023 (Originally 05/15/1964)   INFLUENZA VACCINE  02/25/2023   HEMOGLOBIN A1C  03/12/2023   OPHTHALMOLOGY EXAM  04/21/2023   Diabetic kidney evaluation - Urine ACR  06/12/2023   FOOT EXAM  06/12/2023   Diabetic kidney evaluation - eGFR measurement  09/12/2023   Medicare Annual Wellness (AWV)  11/04/2023   DTaP/Tdap/Td (2 - Td or Tdap) 02/03/2025   Pneumonia Vaccine 85+ Years old  Completed   Zoster Vaccines- Shingrix  Completed   HPV VACCINES  Aged Out    Health Maintenance  Health Maintenance Due  Topic Date Due   COVID-19 Vaccine (3 - 2023-24 season) 03/27/2022    Colorectal cancer screening: Referral to GI placed declined . Pt aware the office will call re: appt.  Lung Cancer Screening: (Low Dose CT Chest recommended if Age 79-80 years, 30 pack-year currently smoking OR have quit w/in 15years.) does qualify.   Lung Cancer Screening Referral: declined   Additional Screening:  Hepatitis C Screening: does qualify;   Vision Screening: Recommended annual ophthalmology exams for early detection of glaucoma and other disorders of the eye. Is the patient up to date with their annual eye exam?  Yes  Who is the provider or what is the name of the office in which the patient attends annual eye exams? Dr.Lee  If pt is not established with a provider, would they like to be referred to a provider to establish care? No .   Dental Screening: Recommended annual dental exams for proper oral hygiene  Community Resource Referral / Chronic Care Management: CRR  required this visit?  No   CCM required this visit?  No      Plan:     I have personally reviewed and noted the following in the patient's chart:   Medical and social history Use of alcohol, tobacco or illicit drugs  Current medications and supplements including opioid prescriptions. Patient is not currently taking opioid prescriptions. Functional ability and status Nutritional status Physical activity Advanced directives List of other physicians Hospitalizations, surgeries, and ER visits in previous 12 months Vitals Screenings to include cognitive, depression, and falls Referrals and appointments  In addition, I have reviewed and discussed with patient certain preventive protocols, quality metrics, and best practice recommendations. A written personalized care plan for preventive services as well as general preventive health recommendations were provided to patient.     Lorrene ReidLaura L Wilson, LPN   [id number]/82/95624/04/2023   Nurse Notes: none

## 2022-12-14 ENCOUNTER — Other Ambulatory Visit: Payer: Self-pay | Admitting: Family Medicine

## 2022-12-14 DIAGNOSIS — E1169 Type 2 diabetes mellitus with other specified complication: Secondary | ICD-10-CM

## 2022-12-22 ENCOUNTER — Telehealth: Payer: Self-pay | Admitting: Family Medicine

## 2022-12-22 DIAGNOSIS — E1169 Type 2 diabetes mellitus with other specified complication: Secondary | ICD-10-CM

## 2022-12-22 MED ORDER — ONETOUCH ULTRA VI STRP: ORAL_STRIP | 0 refills | Status: AC

## 2022-12-22 NOTE — Telephone Encounter (Signed)
Resent corrected rx

## 2022-12-23 ENCOUNTER — Ambulatory Visit (INDEPENDENT_AMBULATORY_CARE_PROVIDER_SITE_OTHER): Payer: Medicare HMO | Admitting: Family Medicine

## 2022-12-23 ENCOUNTER — Encounter: Payer: Self-pay | Admitting: Family Medicine

## 2022-12-23 VITALS — BP 111/70 | HR 70 | Ht 71.0 in | Wt 137.0 lb

## 2022-12-23 DIAGNOSIS — J449 Chronic obstructive pulmonary disease, unspecified: Secondary | ICD-10-CM

## 2022-12-23 DIAGNOSIS — E1169 Type 2 diabetes mellitus with other specified complication: Secondary | ICD-10-CM

## 2022-12-23 DIAGNOSIS — E1159 Type 2 diabetes mellitus with other circulatory complications: Secondary | ICD-10-CM | POA: Diagnosis not present

## 2022-12-23 DIAGNOSIS — Z7984 Long term (current) use of oral hypoglycemic drugs: Secondary | ICD-10-CM | POA: Diagnosis not present

## 2022-12-23 DIAGNOSIS — I152 Hypertension secondary to endocrine disorders: Secondary | ICD-10-CM

## 2022-12-23 DIAGNOSIS — E782 Mixed hyperlipidemia: Secondary | ICD-10-CM | POA: Diagnosis not present

## 2022-12-23 DIAGNOSIS — J439 Emphysema, unspecified: Secondary | ICD-10-CM

## 2022-12-23 LAB — BAYER DCA HB A1C WAIVED: HB A1C (BAYER DCA - WAIVED): 6.5 % — ABNORMAL HIGH (ref 4.8–5.6)

## 2022-12-23 NOTE — Progress Notes (Signed)
BP 111/70   Pulse 70   Ht 5\' 11"  (1.803 m)   Wt 137 lb (62.1 kg)   SpO2 92%   BMI 19.11 kg/m    Subjective:   Patient ID: Charles Castro, male    DOB: 04/19/1946, 77 y.o.   MRN: [id number]  HPI: Charles Castro is a 77 y.o. male presenting on 12/23/2022 for Medical Management of Chronic Issues and Diabetes   HPI Type 2 diabetes mellitus Patient comes in today for recheck of his diabetes. Patient has been currently taking metformin. Patient is currently on an ACE inhibitor/ARB. Patient has not seen an ophthalmologist this year. Patient denies any new issues with their feet. The symptom started onset as an adult htn and hld ARE RELATED TO DM   Hypertension Patient is currently on lisinopril, and their blood pressure today is 111/70. Patient denies any lightheadedness or dizziness. Patient denies headaches, blurred vision, chest pains, shortness of breath, or weakness. Denies any side effects from medication and is content with current medication.   Hyperlipidemia Patient is coming in for recheck of his hyperlipidemia. The patient is currently taking atorvastatin and fish oils. They deny any issues with myalgias or history of liver damage from it. They deny any focal numbness or weakness or chest pain.   COPD Patient is coming in for COPD recheck today.  He is currently on Advair and albuterol although he admits he is only using it occasionally and not consistently.Marland Kitchen  He has a mild chronic cough but denies any major coughing spells or wheezing spells.  He has 0 nighttime symptoms per week and 1 daytime symptoms per week currently.   Relevant past medical, surgical, family and social history reviewed and updated as indicated. Interim medical history since our last visit reviewed. Allergies and medications reviewed and updated.  Review of Systems  Constitutional:  Negative for chills and fever.  Eyes:  Negative for visual disturbance.  Respiratory:  Negative for shortness of breath and  wheezing.   Cardiovascular:  Negative for chest pain and leg swelling.  Musculoskeletal:  Negative for back pain and gait problem.  Skin:  Negative for rash.  Neurological:  Negative for dizziness and light-headedness.  All other systems reviewed and are negative.   Per HPI unless specifically indicated above   Allergies as of 12/23/2022   No Known Allergies      Medication List        Accurate as of Dec 23, 2022 11:26 AM. If you have any questions, ask your nurse or doctor.          acetaminophen 325 MG tablet Commonly known as: TYLENOL Take 650 mg by mouth as needed.   albuterol 108 (90 Base) MCG/ACT inhaler Commonly known as: VENTOLIN HFA INHALE 2 PUFFS INTO LUNGS EVERY 6 HOURS AS NEEDED FOR WHEEZING AND FOR SHORTNESS OF BREATH   aspirin EC 81 MG tablet Take 1 tablet (81 mg total) by mouth daily.   atorvastatin 80 MG tablet Commonly known as: LIPITOR Take 1 tablet (80 mg total) by mouth daily.   clopidogrel 75 MG tablet Commonly known as: PLAVIX Take 1 tablet (75 mg total) by mouth daily.   Fish Oil 1000 MG Caps Take by mouth.   fluticasone-salmeterol 115-21 MCG/ACT inhaler Commonly known as: Advair HFA Inhale 2 puffs into the lungs 2 (two) times daily.   lisinopril 2.5 MG tablet Commonly known as: ZESTRIL Take 1 tablet (2.5 mg total) by mouth daily.   metFORMIN 500 MG  24 hr tablet Commonly known as: GLUCOPHAGE-XR Take 2 tablets (1,000 mg total) by mouth in the morning and at bedtime.   nitroGLYCERIN 0.4 MG SL tablet Commonly known as: NITROSTAT Place 1 tablet (0.4 mg total) under the tongue every 5 (five) minutes x 3 doses as needed for chest pain.   OneTouch Ultra test strip Generic drug: glucose blood Check twice a day   pantoprazole 20 MG tablet Commonly known as: PROTONIX Take 1 tablet (20 mg total) by mouth daily.         Objective:   BP 111/70   Pulse 70   Ht 5\' 11"  (1.803 m)   Wt 137 lb (62.1 kg)   SpO2 92%   BMI 19.11  kg/m   Wt Readings from Last 3 Encounters:  12/23/22 137 lb (62.1 kg)  11/04/22 145 lb (65.8 kg)  09/11/22 146 lb (66.2 kg)    Physical Exam Vitals and nursing note reviewed.  Constitutional:      General: He is not in acute distress.    Appearance: He is well-developed. He is not diaphoretic.  Eyes:     General: No scleral icterus.    Conjunctiva/sclera: Conjunctivae normal.  Neck:     Thyroid: No thyromegaly.  Cardiovascular:     Rate and Rhythm: Normal rate and regular rhythm.     Heart sounds: Normal heart sounds. No murmur heard. Pulmonary:     Effort: Pulmonary effort is normal. No respiratory distress.     Breath sounds: Normal breath sounds. No wheezing.  Musculoskeletal:        General: No swelling. Normal range of motion.     Cervical back: Neck supple.  Lymphadenopathy:     Cervical: No cervical adenopathy.  Skin:    General: Skin is warm and dry.     Findings: No rash.  Neurological:     Mental Status: He is alert and oriented to person, place, and time.     Coordination: Coordination normal.  Psychiatric:        Behavior: Behavior normal.       Assessment & Plan:   Problem List Items Addressed This Visit       Cardiovascular and Mediastinum   Hypertension associated with diabetes (HCC)     Respiratory   COPD (chronic obstructive pulmonary disease) (HCC)     Endocrine   Type 2 diabetes mellitus with other specified complication (HCC) - Primary   Relevant Orders   Bayer DCA Hb A1c Waived     Other   HLD (hyperlipidemia)    A1c 6.5, patient seems to be doing well, no changes. Follow up plan: Return in about 3 months (around 03/25/2023), or if symptoms worsen or fail to improve, for dm and htn and hld.  Counseling provided for all of the vaccine components Orders Placed This Encounter  Procedures   Bayer DCA Hb A1c Waived    Arville Care, MD [facility] 12/23/2022, 11:26 AM

## 2023-03-25 ENCOUNTER — Encounter: Payer: Self-pay | Admitting: Family Medicine

## 2023-03-25 ENCOUNTER — Ambulatory Visit (INDEPENDENT_AMBULATORY_CARE_PROVIDER_SITE_OTHER): Payer: Medicare HMO | Admitting: Family Medicine

## 2023-03-25 VITALS — BP 129/71 | HR 88 | Ht 71.0 in | Wt 137.0 lb

## 2023-03-25 DIAGNOSIS — E1159 Type 2 diabetes mellitus with other circulatory complications: Secondary | ICD-10-CM

## 2023-03-25 DIAGNOSIS — R0981 Nasal congestion: Secondary | ICD-10-CM | POA: Diagnosis not present

## 2023-03-25 DIAGNOSIS — Z7984 Long term (current) use of oral hypoglycemic drugs: Secondary | ICD-10-CM | POA: Diagnosis not present

## 2023-03-25 DIAGNOSIS — E782 Mixed hyperlipidemia: Secondary | ICD-10-CM

## 2023-03-25 DIAGNOSIS — I152 Hypertension secondary to endocrine disorders: Secondary | ICD-10-CM | POA: Diagnosis not present

## 2023-03-25 DIAGNOSIS — E1169 Type 2 diabetes mellitus with other specified complication: Secondary | ICD-10-CM

## 2023-03-25 DIAGNOSIS — M7062 Trochanteric bursitis, left hip: Secondary | ICD-10-CM | POA: Diagnosis not present

## 2023-03-25 LAB — BAYER DCA HB A1C WAIVED: HB A1C (BAYER DCA - WAIVED): 6.1 % — ABNORMAL HIGH (ref 4.8–5.6)

## 2023-03-25 MED ORDER — PREDNISONE 20 MG PO TABS: ORAL_TABLET | ORAL | 0 refills | Status: AC

## 2023-03-25 NOTE — Progress Notes (Signed)
BP 129/71   Pulse 88   Ht 5\' 11"  (1.803 m)   Wt 137 lb (62.1 kg)   SpO2 91%   BMI 19.11 kg/m    Subjective:   Patient ID: Charles Castro, male    DOB: 01-09-1946, 77 y.o.   MRN: [id number]  HPI: Charles Castro is a 77 y.o. male presenting on 03/25/2023 for Medical Management of Chronic Issues, Diabetes, Hip Pain (left), and Sinusitis (Since Monday. Not improving with OTC tx)   HPI Type 2 diabetes mellitus Patient comes in today for recheck of his diabetes. Patient has been currently taking metformin. Patient is currently on an ACE inhibitor/ARB. Patient has not seen an ophthalmologist this year. Patient denies any new issues with their feet. The symptom started onset as an adult hypertension and hyperlipidemia ARE RELATED TO DM   Hypertension Patient is currently on lisinopril, and their blood pressure today is 129/71. Patient denies any lightheadedness or dizziness. Patient denies headaches, blurred vision, chest pains, shortness of breath, or weakness. Denies any side effects from medication and is content with current medication.   Hyperlipidemia Patient is coming in for recheck of his hyperlipidemia. The patient is currently taking fish oils and atorvastatin. They deny any issues with myalgias or history of liver damage from it. They deny any focal numbness or weakness or chest pain.   Patient is coming in complaining of 3 to 4 days worth of sinus congestion and pressure and nasal drainage and congestion.  He has tried some Benadryl at night and does not feel like it is helping.  He does admit to having a little bit of wheezing and is using his albuterol inhaler but he is just not able to completely clear it.  He denies any fevers or chills or sick contacts that he knows of.  He denies any shortness of breath.  Patient is coming in complaining of left lateral hip pain that this been bothering him off and on for a couple months.  He says it has been really affecting his sleep lately  because he sleeps on the left side and anytime he rolls onto that left side it wakes him up because of the pain.  He has a very pinpoint spot on the left lateral hip that is bothering him.  He denies any issues when he is up and walking around or moving, it is more when he lays on it.  He has used heating pad and that does help some.  Relevant past medical, surgical, family and social history reviewed and updated as indicated. Interim medical history since our last visit reviewed. Allergies and medications reviewed and updated.  Review of Systems  Constitutional:  Negative for chills and fever.  HENT:  Positive for congestion, sinus pressure and sinus pain.   Eyes:  Negative for visual disturbance.  Respiratory:  Negative for shortness of breath and wheezing.   Cardiovascular:  Negative for chest pain and leg swelling.  Musculoskeletal:  Positive for arthralgias and myalgias. Negative for back pain and gait problem.  Skin:  Negative for rash.  All other systems reviewed and are negative.   Per HPI unless specifically indicated above   Allergies as of 03/25/2023   No Known Allergies      Medication List        Accurate as of March 25, 2023 11:28 AM. If you have any questions, ask your nurse or doctor.          acetaminophen 325 MG tablet  Commonly known as: TYLENOL Take 650 mg by mouth as needed.   albuterol 108 (90 Base) MCG/ACT inhaler Commonly known as: VENTOLIN HFA INHALE 2 PUFFS INTO LUNGS EVERY 6 HOURS AS NEEDED FOR WHEEZING AND FOR SHORTNESS OF BREATH   aspirin EC 81 MG tablet Take 1 tablet (81 mg total) by mouth daily.   atorvastatin 80 MG tablet Commonly known as: LIPITOR Take 1 tablet (80 mg total) by mouth daily.   clopidogrel 75 MG tablet Commonly known as: PLAVIX Take 1 tablet (75 mg total) by mouth daily.   Fish Oil 1000 MG Caps Take by mouth.   fluticasone-salmeterol 115-21 MCG/ACT inhaler Commonly known as: Advair HFA Inhale 2 puffs into the  lungs 2 (two) times daily.   lisinopril 2.5 MG tablet Commonly known as: ZESTRIL Take 1 tablet (2.5 mg total) by mouth daily.   metFORMIN 500 MG 24 hr tablet Commonly known as: GLUCOPHAGE-XR Take 2 tablets (1,000 mg total) by mouth in the morning and at bedtime.   nitroGLYCERIN 0.4 MG SL tablet Commonly known as: NITROSTAT Place 1 tablet (0.4 mg total) under the tongue every 5 (five) minutes x 3 doses as needed for chest pain.   OneTouch Ultra test strip Generic drug: glucose blood Check twice a day   pantoprazole 20 MG tablet Commonly known as: PROTONIX Take 1 tablet (20 mg total) by mouth daily.   predniSONE 20 MG tablet Commonly known as: DELTASONE 2 po at same time daily for 5 days Started by: Elige Radon Aerilyn Slee         Objective:   BP 129/71   Pulse 88   Ht 5\' 11"  (1.803 m)   Wt 137 lb (62.1 kg)   SpO2 91%   BMI 19.11 kg/m   Wt Readings from Last 3 Encounters:  03/25/23 137 lb (62.1 kg)  12/23/22 137 lb (62.1 kg)  11/04/22 145 lb (65.8 kg)    Physical Exam Vitals and nursing note reviewed.  Constitutional:      General: He is not in acute distress.    Appearance: He is well-developed. He is not diaphoretic.  Eyes:     General: No scleral icterus.    Conjunctiva/sclera: Conjunctivae normal.  Neck:     Thyroid: No thyromegaly.  Cardiovascular:     Rate and Rhythm: Normal rate and regular rhythm.     Heart sounds: Normal heart sounds. No murmur heard. Pulmonary:     Effort: Pulmonary effort is normal. No respiratory distress.     Breath sounds: Normal breath sounds. No wheezing.  Musculoskeletal:        General: No swelling. Normal range of motion.     Cervical back: Neck supple.  Lymphadenopathy:     Cervical: No cervical adenopathy.  Skin:    General: Skin is warm and dry.     Findings: No rash.  Neurological:     Mental Status: He is alert and oriented to person, place, and time.     Coordination: Coordination normal.  Psychiatric:         Behavior: Behavior normal.       Assessment & Plan:   Problem List Items Addressed This Visit       Cardiovascular and Mediastinum   Hypertension associated with diabetes (HCC)   Relevant Orders   CBC with Differential/Platelet   CMP14+EGFR   Lipid panel   Bayer DCA Hb A1c Waived     Endocrine   Type 2 diabetes mellitus with other specified complication (HCC) -  Primary   Relevant Orders   CBC with Differential/Platelet   CMP14+EGFR   Lipid panel   Bayer DCA Hb A1c Waived     Other   HLD (hyperlipidemia)   Relevant Orders   CBC with Differential/Platelet   CMP14+EGFR   Lipid panel   Bayer DCA Hb A1c Waived   Other Visit Diagnoses     Sinus congestion       Relevant Medications   predniSONE (DELTASONE) 20 MG tablet   Greater trochanteric bursitis of left hip       Relevant Medications   predniSONE (DELTASONE) 20 MG tablet       A1c is 6.1, looks good today.  Keep up the good work.  He has been having some congestion and then left hip bursitis based on exam, will send a short course of prednisone to help with both and then reevaluate or come back if not improved.   Follow up plan: Return in about 3 months (around 06/25/2023), or if symptoms worsen or fail to improve, for Diabetes recheck.  Counseling provided for all of the vaccine components Orders Placed This Encounter  Procedures   CBC with Differential/Platelet   CMP14+EGFR   Lipid panel   Bayer DCA Hb A1c Waived    Arville Care, MD [facility] 03/25/2023, 11:28 AM

## 2023-03-26 LAB — CBC WITH DIFFERENTIAL/PLATELET
Basophils Absolute: 0 10*3/uL (ref 0.0–0.2)
Basos: 1 %
EOS (ABSOLUTE): 0.4 10*3/uL (ref 0.0–0.4)
Eos: 5 %
Hematocrit: 42.1 % (ref 37.5–51.0)
Hemoglobin: 14.4 g/dL (ref 13.0–17.7)
Immature Grans (Abs): 0 10*3/uL (ref 0.0–0.1)
Immature Granulocytes: 0 %
Lymphocytes Absolute: 1.7 10*3/uL (ref 0.7–3.1)
Lymphs: 22 %
MCH: 31.8 pg (ref 26.6–33.0)
MCHC: 34.2 g/dL (ref 31.5–35.7)
MCV: 93 fL (ref 79–97)
Monocytes Absolute: 0.7 10*3/uL (ref 0.1–0.9)
Monocytes: 8 %
Neutrophils Absolute: 5 10*3/uL (ref 1.4–7.0)
Neutrophils: 64 %
Platelets: 196 10*3/uL (ref 150–450)
RBC: 4.53 x10E6/uL (ref 4.14–5.80)
RDW: 12.6 % (ref 11.6–15.4)
WBC: 7.8 10*3/uL (ref 3.4–10.8)

## 2023-03-26 LAB — CMP14+EGFR
ALT: 23 IU/L (ref 0–44)
AST: 22 IU/L (ref 0–40)
Albumin: 4.4 g/dL (ref 3.8–4.8)
Alkaline Phosphatase: 91 IU/L (ref 44–121)
BUN/Creatinine Ratio: 19 (ref 10–24)
BUN: 13 mg/dL (ref 8–27)
Bilirubin Total: 0.4 mg/dL (ref 0.0–1.2)
CO2: 20 mmol/L (ref 20–29)
Calcium: 9.6 mg/dL (ref 8.6–10.2)
Chloride: 97 mmol/L (ref 96–106)
Creatinine, Ser: 0.68 mg/dL — ABNORMAL LOW (ref 0.76–1.27)
Globulin, Total: 2.4 g/dL (ref 1.5–4.5)
Glucose: 113 mg/dL — ABNORMAL HIGH (ref 70–99)
Potassium: 4.4 mmol/L (ref 3.5–5.2)
Sodium: 131 mmol/L — ABNORMAL LOW (ref 134–144)
Total Protein: 6.8 g/dL (ref 6.0–8.5)
eGFR: 98 mL/min/{1.73_m2} (ref >59)

## 2023-03-26 LAB — LIPID PANEL
Chol/HDL Ratio: 2.3 ratio (ref 0.0–5.0)
Cholesterol, Total: 86 mg/dL — ABNORMAL LOW (ref 100–199)
HDL: 37 mg/dL — ABNORMAL LOW (ref >39)
LDL Chol Calc (NIH): 32 mg/dL (ref 0–99)
Triglycerides: 82 mg/dL (ref 0–149)
VLDL Cholesterol Cal: 17 mg/dL (ref 5–40)

## 2023-04-01 ENCOUNTER — Telehealth: Payer: Self-pay | Admitting: Family Medicine

## 2023-04-01 MED ORDER — AMOXICILLIN 500 MG PO CAPS: 500.0000 mg | ORAL_CAPSULE | Freq: Two times a day (BID) | ORAL | 0 refills | Status: AC

## 2023-04-01 NOTE — Telephone Encounter (Signed)
Pt was recently seen for a sick visit and says he is no better. Wants to know if something stronger can be sent in for him to take?

## 2023-04-01 NOTE — Telephone Encounter (Signed)
What there is no better mean, what symptoms he is he still having?  Did he get any better on the medicine?,  I sent in prednisone before.

## 2023-04-01 NOTE — Telephone Encounter (Signed)
Sent amoxicillin for him

## 2023-04-01 NOTE — Telephone Encounter (Signed)
Pt made aware

## 2023-04-06 ENCOUNTER — Telehealth: Payer: Self-pay | Admitting: Family Medicine

## 2023-05-21 DIAGNOSIS — I2510 Atherosclerotic heart disease of native coronary artery without angina pectoris: Secondary | ICD-10-CM | POA: Diagnosis not present

## 2023-05-21 DIAGNOSIS — I152 Hypertension secondary to endocrine disorders: Secondary | ICD-10-CM | POA: Diagnosis not present

## 2023-05-21 DIAGNOSIS — E782 Mixed hyperlipidemia: Secondary | ICD-10-CM | POA: Diagnosis not present

## 2023-05-21 DIAGNOSIS — R0609 Other forms of dyspnea: Secondary | ICD-10-CM | POA: Diagnosis not present

## 2023-05-21 DIAGNOSIS — R911 Solitary pulmonary nodule: Secondary | ICD-10-CM | POA: Diagnosis not present

## 2023-05-21 DIAGNOSIS — E1159 Type 2 diabetes mellitus with other circulatory complications: Secondary | ICD-10-CM | POA: Diagnosis not present

## 2023-06-03 ENCOUNTER — Other Ambulatory Visit: Payer: Self-pay | Admitting: Family Medicine

## 2023-06-03 DIAGNOSIS — E1159 Type 2 diabetes mellitus with other circulatory complications: Secondary | ICD-10-CM

## 2023-06-03 DIAGNOSIS — E1169 Type 2 diabetes mellitus with other specified complication: Secondary | ICD-10-CM

## 2023-06-11 DIAGNOSIS — R0609 Other forms of dyspnea: Secondary | ICD-10-CM | POA: Diagnosis not present

## 2023-06-30 ENCOUNTER — Ambulatory Visit: Payer: Medicare HMO | Admitting: Family Medicine

## 2023-07-08 ENCOUNTER — Encounter: Payer: Self-pay | Admitting: Family Medicine

## 2023-07-08 ENCOUNTER — Other Ambulatory Visit: Payer: Self-pay | Admitting: Family Medicine

## 2023-07-08 DIAGNOSIS — E1159 Type 2 diabetes mellitus with other circulatory complications: Secondary | ICD-10-CM

## 2023-07-08 DIAGNOSIS — E1169 Type 2 diabetes mellitus with other specified complication: Secondary | ICD-10-CM

## 2023-07-08 NOTE — Telephone Encounter (Signed)
Dettinger pt NTBS 30-d given 06/05/23

## 2023-07-08 NOTE — Telephone Encounter (Signed)
LMTCB to schedule appt Letter mailed 

## 2023-07-19 ENCOUNTER — Other Ambulatory Visit: Payer: Self-pay | Admitting: Family Medicine

## 2023-07-19 DIAGNOSIS — E1169 Type 2 diabetes mellitus with other specified complication: Secondary | ICD-10-CM

## 2023-07-19 DIAGNOSIS — E1159 Type 2 diabetes mellitus with other circulatory complications: Secondary | ICD-10-CM

## 2023-08-06 DIAGNOSIS — R911 Solitary pulmonary nodule: Secondary | ICD-10-CM | POA: Diagnosis not present

## 2023-08-06 DIAGNOSIS — J8410 Pulmonary fibrosis, unspecified: Secondary | ICD-10-CM | POA: Diagnosis not present

## 2023-08-06 DIAGNOSIS — J439 Emphysema, unspecified: Secondary | ICD-10-CM | POA: Diagnosis not present

## 2023-08-06 DIAGNOSIS — R918 Other nonspecific abnormal finding of lung field: Secondary | ICD-10-CM | POA: Diagnosis not present

## 2023-08-18 DIAGNOSIS — E785 Hyperlipidemia, unspecified: Secondary | ICD-10-CM | POA: Diagnosis not present

## 2023-08-18 DIAGNOSIS — I252 Old myocardial infarction: Secondary | ICD-10-CM | POA: Diagnosis not present

## 2023-08-18 DIAGNOSIS — I152 Hypertension secondary to endocrine disorders: Secondary | ICD-10-CM | POA: Diagnosis not present

## 2023-08-18 DIAGNOSIS — I2510 Atherosclerotic heart disease of native coronary artery without angina pectoris: Secondary | ICD-10-CM | POA: Diagnosis not present

## 2023-08-18 DIAGNOSIS — E1159 Type 2 diabetes mellitus with other circulatory complications: Secondary | ICD-10-CM | POA: Diagnosis not present

## 2023-08-18 DIAGNOSIS — Z794 Long term (current) use of insulin: Secondary | ICD-10-CM | POA: Diagnosis not present

## 2023-08-18 DIAGNOSIS — E1169 Type 2 diabetes mellitus with other specified complication: Secondary | ICD-10-CM | POA: Diagnosis not present

## 2023-08-18 DIAGNOSIS — Z1330 Encounter for screening examination for mental health and behavioral disorders, unspecified: Secondary | ICD-10-CM | POA: Diagnosis not present

## 2023-08-18 DIAGNOSIS — J449 Chronic obstructive pulmonary disease, unspecified: Secondary | ICD-10-CM | POA: Diagnosis not present

## 2023-08-18 DIAGNOSIS — E119 Type 2 diabetes mellitus without complications: Secondary | ICD-10-CM | POA: Diagnosis not present

## 2023-10-20 DIAGNOSIS — R0602 Shortness of breath: Secondary | ICD-10-CM | POA: Diagnosis not present

## 2023-10-20 DIAGNOSIS — I4510 Unspecified right bundle-branch block: Secondary | ICD-10-CM | POA: Diagnosis not present

## 2023-10-20 DIAGNOSIS — J449 Chronic obstructive pulmonary disease, unspecified: Secondary | ICD-10-CM | POA: Diagnosis not present

## 2023-10-20 DIAGNOSIS — J441 Chronic obstructive pulmonary disease with (acute) exacerbation: Secondary | ICD-10-CM | POA: Diagnosis not present

## 2023-10-20 DIAGNOSIS — R9431 Abnormal electrocardiogram [ECG] [EKG]: Secondary | ICD-10-CM | POA: Diagnosis not present

## 2023-10-28 ENCOUNTER — Other Ambulatory Visit: Payer: Self-pay | Admitting: Family Medicine

## 2023-10-28 DIAGNOSIS — E1169 Type 2 diabetes mellitus with other specified complication: Secondary | ICD-10-CM

## 2023-11-04 DIAGNOSIS — J449 Chronic obstructive pulmonary disease, unspecified: Secondary | ICD-10-CM | POA: Diagnosis not present

## 2023-11-04 DIAGNOSIS — R911 Solitary pulmonary nodule: Secondary | ICD-10-CM | POA: Diagnosis not present

## 2023-11-04 DIAGNOSIS — F17211 Nicotine dependence, cigarettes, in remission: Secondary | ICD-10-CM | POA: Diagnosis not present

## 2023-11-04 DIAGNOSIS — E1159 Type 2 diabetes mellitus with other circulatory complications: Secondary | ICD-10-CM | POA: Diagnosis not present

## 2023-11-04 DIAGNOSIS — Z716 Tobacco abuse counseling: Secondary | ICD-10-CM | POA: Diagnosis not present

## 2023-11-04 DIAGNOSIS — I152 Hypertension secondary to endocrine disorders: Secondary | ICD-10-CM | POA: Diagnosis not present

## 2023-11-17 DIAGNOSIS — J441 Chronic obstructive pulmonary disease with (acute) exacerbation: Secondary | ICD-10-CM | POA: Diagnosis not present

## 2023-11-17 DIAGNOSIS — J189 Pneumonia, unspecified organism: Secondary | ICD-10-CM | POA: Diagnosis not present

## 2023-11-17 DIAGNOSIS — Z87891 Personal history of nicotine dependence: Secondary | ICD-10-CM | POA: Diagnosis not present

## 2023-11-17 DIAGNOSIS — J439 Emphysema, unspecified: Secondary | ICD-10-CM | POA: Diagnosis not present

## 2023-11-17 DIAGNOSIS — R0600 Dyspnea, unspecified: Secondary | ICD-10-CM | POA: Diagnosis not present

## 2023-11-17 DIAGNOSIS — R000 Tachycardia, unspecified: Secondary | ICD-10-CM | POA: Diagnosis not present

## 2023-11-17 DIAGNOSIS — I10 Essential (primary) hypertension: Secondary | ICD-10-CM | POA: Diagnosis not present

## 2023-11-17 DIAGNOSIS — R918 Other nonspecific abnormal finding of lung field: Secondary | ICD-10-CM | POA: Diagnosis not present

## 2023-11-17 DIAGNOSIS — E119 Type 2 diabetes mellitus without complications: Secondary | ICD-10-CM | POA: Diagnosis not present

## 2023-11-18 DIAGNOSIS — I2510 Atherosclerotic heart disease of native coronary artery without angina pectoris: Secondary | ICD-10-CM | POA: Diagnosis not present

## 2023-11-18 DIAGNOSIS — E119 Type 2 diabetes mellitus without complications: Secondary | ICD-10-CM | POA: Diagnosis not present

## 2023-11-18 DIAGNOSIS — E1165 Type 2 diabetes mellitus with hyperglycemia: Secondary | ICD-10-CM | POA: Diagnosis not present

## 2023-11-18 DIAGNOSIS — R918 Other nonspecific abnormal finding of lung field: Secondary | ICD-10-CM | POA: Diagnosis not present

## 2023-11-18 DIAGNOSIS — Z9981 Dependence on supplemental oxygen: Secondary | ICD-10-CM | POA: Diagnosis not present

## 2023-11-18 DIAGNOSIS — R6889 Other general symptoms and signs: Secondary | ICD-10-CM | POA: Diagnosis not present

## 2023-11-18 DIAGNOSIS — R110 Nausea: Secondary | ICD-10-CM | POA: Diagnosis not present

## 2023-11-18 DIAGNOSIS — J9621 Acute and chronic respiratory failure with hypoxia: Secondary | ICD-10-CM | POA: Diagnosis not present

## 2023-11-18 DIAGNOSIS — Z87442 Personal history of urinary calculi: Secondary | ICD-10-CM | POA: Diagnosis not present

## 2023-11-18 DIAGNOSIS — I10 Essential (primary) hypertension: Secondary | ICD-10-CM | POA: Diagnosis not present

## 2023-11-18 DIAGNOSIS — J441 Chronic obstructive pulmonary disease with (acute) exacerbation: Secondary | ICD-10-CM | POA: Diagnosis not present

## 2023-11-18 DIAGNOSIS — I152 Hypertension secondary to endocrine disorders: Secondary | ICD-10-CM | POA: Diagnosis not present

## 2023-11-18 DIAGNOSIS — E1159 Type 2 diabetes mellitus with other circulatory complications: Secondary | ICD-10-CM | POA: Diagnosis not present

## 2023-11-18 DIAGNOSIS — J9622 Acute and chronic respiratory failure with hypercapnia: Secondary | ICD-10-CM | POA: Diagnosis not present

## 2023-11-18 DIAGNOSIS — Z955 Presence of coronary angioplasty implant and graft: Secondary | ICD-10-CM | POA: Diagnosis not present

## 2023-11-18 DIAGNOSIS — R911 Solitary pulmonary nodule: Secondary | ICD-10-CM | POA: Diagnosis not present

## 2023-11-18 DIAGNOSIS — J189 Pneumonia, unspecified organism: Secondary | ICD-10-CM | POA: Diagnosis not present

## 2023-11-18 DIAGNOSIS — Z1152 Encounter for screening for COVID-19: Secondary | ICD-10-CM | POA: Diagnosis not present

## 2023-11-18 DIAGNOSIS — Z87891 Personal history of nicotine dependence: Secondary | ICD-10-CM | POA: Diagnosis not present

## 2023-11-18 DIAGNOSIS — J9601 Acute respiratory failure with hypoxia: Secondary | ICD-10-CM | POA: Diagnosis not present

## 2023-11-18 DIAGNOSIS — Z7984 Long term (current) use of oral hypoglycemic drugs: Secondary | ICD-10-CM | POA: Diagnosis not present

## 2023-11-18 DIAGNOSIS — R0602 Shortness of breath: Secondary | ICD-10-CM | POA: Diagnosis not present

## 2023-11-18 DIAGNOSIS — J439 Emphysema, unspecified: Secondary | ICD-10-CM | POA: Diagnosis not present

## 2023-11-18 DIAGNOSIS — Z7982 Long term (current) use of aspirin: Secondary | ICD-10-CM | POA: Diagnosis not present

## 2023-11-18 DIAGNOSIS — E785 Hyperlipidemia, unspecified: Secondary | ICD-10-CM | POA: Diagnosis not present

## 2023-11-19 DIAGNOSIS — J441 Chronic obstructive pulmonary disease with (acute) exacerbation: Secondary | ICD-10-CM | POA: Diagnosis not present

## 2023-11-19 DIAGNOSIS — J9622 Acute and chronic respiratory failure with hypercapnia: Secondary | ICD-10-CM | POA: Diagnosis not present

## 2023-11-19 DIAGNOSIS — J9601 Acute respiratory failure with hypoxia: Secondary | ICD-10-CM | POA: Diagnosis not present

## 2023-11-19 DIAGNOSIS — R911 Solitary pulmonary nodule: Secondary | ICD-10-CM | POA: Diagnosis not present

## 2023-11-19 DIAGNOSIS — J189 Pneumonia, unspecified organism: Secondary | ICD-10-CM | POA: Diagnosis not present

## 2023-11-19 DIAGNOSIS — Z87891 Personal history of nicotine dependence: Secondary | ICD-10-CM | POA: Diagnosis not present

## 2023-11-19 DIAGNOSIS — J9621 Acute and chronic respiratory failure with hypoxia: Secondary | ICD-10-CM | POA: Diagnosis not present

## 2023-11-19 DIAGNOSIS — E119 Type 2 diabetes mellitus without complications: Secondary | ICD-10-CM | POA: Diagnosis not present

## 2023-11-20 DIAGNOSIS — J9622 Acute and chronic respiratory failure with hypercapnia: Secondary | ICD-10-CM | POA: Diagnosis not present

## 2023-11-20 DIAGNOSIS — J9601 Acute respiratory failure with hypoxia: Secondary | ICD-10-CM | POA: Diagnosis not present

## 2023-11-20 DIAGNOSIS — J9621 Acute and chronic respiratory failure with hypoxia: Secondary | ICD-10-CM | POA: Diagnosis not present

## 2023-11-20 DIAGNOSIS — Z87891 Personal history of nicotine dependence: Secondary | ICD-10-CM | POA: Diagnosis not present

## 2023-11-20 DIAGNOSIS — J441 Chronic obstructive pulmonary disease with (acute) exacerbation: Secondary | ICD-10-CM | POA: Diagnosis not present

## 2023-11-20 DIAGNOSIS — E119 Type 2 diabetes mellitus without complications: Secondary | ICD-10-CM | POA: Diagnosis not present

## 2023-11-20 DIAGNOSIS — J189 Pneumonia, unspecified organism: Secondary | ICD-10-CM | POA: Diagnosis not present

## 2023-11-20 DIAGNOSIS — R911 Solitary pulmonary nodule: Secondary | ICD-10-CM | POA: Diagnosis not present

## 2023-11-24 DIAGNOSIS — E119 Type 2 diabetes mellitus without complications: Secondary | ICD-10-CM | POA: Diagnosis not present

## 2023-11-24 DIAGNOSIS — J449 Chronic obstructive pulmonary disease, unspecified: Secondary | ICD-10-CM | POA: Diagnosis not present

## 2023-11-24 DIAGNOSIS — Z794 Long term (current) use of insulin: Secondary | ICD-10-CM | POA: Diagnosis not present

## 2023-11-24 DIAGNOSIS — E1159 Type 2 diabetes mellitus with other circulatory complications: Secondary | ICD-10-CM | POA: Diagnosis not present

## 2023-11-24 DIAGNOSIS — I152 Hypertension secondary to endocrine disorders: Secondary | ICD-10-CM | POA: Diagnosis not present

## 2023-11-24 DIAGNOSIS — J441 Chronic obstructive pulmonary disease with (acute) exacerbation: Secondary | ICD-10-CM | POA: Diagnosis not present

## 2023-11-24 DIAGNOSIS — J9610 Chronic respiratory failure, unspecified whether with hypoxia or hypercapnia: Secondary | ICD-10-CM | POA: Diagnosis not present

## 2023-11-24 DIAGNOSIS — J189 Pneumonia, unspecified organism: Secondary | ICD-10-CM | POA: Diagnosis not present

## 2023-12-19 DIAGNOSIS — J449 Chronic obstructive pulmonary disease, unspecified: Secondary | ICD-10-CM | POA: Diagnosis not present

## 2023-12-19 DIAGNOSIS — J441 Chronic obstructive pulmonary disease with (acute) exacerbation: Secondary | ICD-10-CM | POA: Diagnosis not present

## 2023-12-19 DIAGNOSIS — R911 Solitary pulmonary nodule: Secondary | ICD-10-CM | POA: Diagnosis not present

## 2023-12-22 DIAGNOSIS — J449 Chronic obstructive pulmonary disease, unspecified: Secondary | ICD-10-CM | POA: Diagnosis not present

## 2023-12-22 DIAGNOSIS — I152 Hypertension secondary to endocrine disorders: Secondary | ICD-10-CM | POA: Diagnosis not present

## 2023-12-22 DIAGNOSIS — J9610 Chronic respiratory failure, unspecified whether with hypoxia or hypercapnia: Secondary | ICD-10-CM | POA: Diagnosis not present

## 2023-12-22 DIAGNOSIS — E1159 Type 2 diabetes mellitus with other circulatory complications: Secondary | ICD-10-CM | POA: Diagnosis not present

## 2023-12-27 DIAGNOSIS — I152 Hypertension secondary to endocrine disorders: Secondary | ICD-10-CM | POA: Diagnosis not present

## 2023-12-27 DIAGNOSIS — Z136 Encounter for screening for cardiovascular disorders: Secondary | ICD-10-CM | POA: Diagnosis not present

## 2023-12-27 DIAGNOSIS — E1169 Type 2 diabetes mellitus with other specified complication: Secondary | ICD-10-CM | POA: Diagnosis not present

## 2023-12-27 DIAGNOSIS — I2510 Atherosclerotic heart disease of native coronary artery without angina pectoris: Secondary | ICD-10-CM | POA: Diagnosis not present

## 2023-12-27 DIAGNOSIS — Z0000 Encounter for general adult medical examination without abnormal findings: Secondary | ICD-10-CM | POA: Diagnosis not present

## 2023-12-27 DIAGNOSIS — Z87891 Personal history of nicotine dependence: Secondary | ICD-10-CM | POA: Diagnosis not present

## 2023-12-27 DIAGNOSIS — R351 Nocturia: Secondary | ICD-10-CM | POA: Diagnosis not present

## 2023-12-27 DIAGNOSIS — Z794 Long term (current) use of insulin: Secondary | ICD-10-CM | POA: Diagnosis not present

## 2023-12-27 DIAGNOSIS — E785 Hyperlipidemia, unspecified: Secondary | ICD-10-CM | POA: Diagnosis not present

## 2023-12-27 DIAGNOSIS — J449 Chronic obstructive pulmonary disease, unspecified: Secondary | ICD-10-CM | POA: Diagnosis not present

## 2023-12-27 DIAGNOSIS — E1159 Type 2 diabetes mellitus with other circulatory complications: Secondary | ICD-10-CM | POA: Diagnosis not present

## 2023-12-27 DIAGNOSIS — Z23 Encounter for immunization: Secondary | ICD-10-CM | POA: Diagnosis not present

## 2023-12-27 DIAGNOSIS — Z1211 Encounter for screening for malignant neoplasm of colon: Secondary | ICD-10-CM | POA: Diagnosis not present

## 2023-12-30 DIAGNOSIS — Z9981 Dependence on supplemental oxygen: Secondary | ICD-10-CM | POA: Diagnosis not present

## 2023-12-30 DIAGNOSIS — E1159 Type 2 diabetes mellitus with other circulatory complications: Secondary | ICD-10-CM | POA: Diagnosis not present

## 2023-12-30 DIAGNOSIS — F17211 Nicotine dependence, cigarettes, in remission: Secondary | ICD-10-CM | POA: Diagnosis not present

## 2023-12-30 DIAGNOSIS — R911 Solitary pulmonary nodule: Secondary | ICD-10-CM | POA: Diagnosis not present

## 2023-12-30 DIAGNOSIS — J449 Chronic obstructive pulmonary disease, unspecified: Secondary | ICD-10-CM | POA: Diagnosis not present

## 2023-12-30 DIAGNOSIS — I152 Hypertension secondary to endocrine disorders: Secondary | ICD-10-CM | POA: Diagnosis not present

## 2023-12-30 DIAGNOSIS — J189 Pneumonia, unspecified organism: Secondary | ICD-10-CM | POA: Diagnosis not present

## 2023-12-30 DIAGNOSIS — Z09 Encounter for follow-up examination after completed treatment for conditions other than malignant neoplasm: Secondary | ICD-10-CM | POA: Diagnosis not present

## 2024-01-05 ENCOUNTER — Telehealth: Payer: Self-pay | Admitting: Pharmacist

## 2024-01-05 NOTE — Telephone Encounter (Signed)
   This patient is appearing on a report for being at risk of failing the adherence measure for diabetes medications this calendar year.   Medication: metformin  Last fill date: 12/27/23 for 90 day supply  Insurance report was not up to date. No action needed at this time.    Jaze Rodino Dattero Jaiquan Temme, PharmD, BCACP, CPP Clinical Pharmacist, [facility]

## 2024-01-06 DIAGNOSIS — I152 Hypertension secondary to endocrine disorders: Secondary | ICD-10-CM | POA: Diagnosis not present

## 2024-01-06 DIAGNOSIS — E785 Hyperlipidemia, unspecified: Secondary | ICD-10-CM | POA: Diagnosis not present

## 2024-01-06 DIAGNOSIS — E1159 Type 2 diabetes mellitus with other circulatory complications: Secondary | ICD-10-CM | POA: Diagnosis not present

## 2024-01-06 DIAGNOSIS — E1169 Type 2 diabetes mellitus with other specified complication: Secondary | ICD-10-CM | POA: Diagnosis not present

## 2024-01-06 DIAGNOSIS — I2510 Atherosclerotic heart disease of native coronary artery without angina pectoris: Secondary | ICD-10-CM | POA: Diagnosis not present

## 2024-01-14 DIAGNOSIS — R911 Solitary pulmonary nodule: Secondary | ICD-10-CM | POA: Diagnosis not present

## 2024-01-14 DIAGNOSIS — Z87891 Personal history of nicotine dependence: Secondary | ICD-10-CM | POA: Diagnosis not present

## 2024-01-14 DIAGNOSIS — F17211 Nicotine dependence, cigarettes, in remission: Secondary | ICD-10-CM | POA: Diagnosis not present

## 2024-01-14 DIAGNOSIS — J449 Chronic obstructive pulmonary disease, unspecified: Secondary | ICD-10-CM | POA: Diagnosis not present

## 2024-01-14 DIAGNOSIS — Z136 Encounter for screening for cardiovascular disorders: Secondary | ICD-10-CM | POA: Diagnosis not present

## 2024-01-14 DIAGNOSIS — J984 Other disorders of lung: Secondary | ICD-10-CM | POA: Diagnosis not present

## 2024-01-19 DIAGNOSIS — J449 Chronic obstructive pulmonary disease, unspecified: Secondary | ICD-10-CM | POA: Diagnosis not present

## 2024-01-19 DIAGNOSIS — J441 Chronic obstructive pulmonary disease with (acute) exacerbation: Secondary | ICD-10-CM | POA: Diagnosis not present

## 2024-01-19 DIAGNOSIS — R911 Solitary pulmonary nodule: Secondary | ICD-10-CM | POA: Diagnosis not present

## 2024-01-20 DIAGNOSIS — J449 Chronic obstructive pulmonary disease, unspecified: Secondary | ICD-10-CM | POA: Diagnosis not present

## 2024-01-20 DIAGNOSIS — E1159 Type 2 diabetes mellitus with other circulatory complications: Secondary | ICD-10-CM | POA: Diagnosis not present

## 2024-01-20 DIAGNOSIS — I152 Hypertension secondary to endocrine disorders: Secondary | ICD-10-CM | POA: Diagnosis not present

## 2024-01-20 DIAGNOSIS — J9610 Chronic respiratory failure, unspecified whether with hypoxia or hypercapnia: Secondary | ICD-10-CM | POA: Diagnosis not present

## 2024-02-04 DIAGNOSIS — R911 Solitary pulmonary nodule: Secondary | ICD-10-CM | POA: Diagnosis not present

## 2024-02-18 DIAGNOSIS — J449 Chronic obstructive pulmonary disease, unspecified: Secondary | ICD-10-CM | POA: Diagnosis not present

## 2024-02-18 DIAGNOSIS — R911 Solitary pulmonary nodule: Secondary | ICD-10-CM | POA: Diagnosis not present

## 2024-02-18 DIAGNOSIS — J441 Chronic obstructive pulmonary disease with (acute) exacerbation: Secondary | ICD-10-CM | POA: Diagnosis not present

## 2024-02-23 DIAGNOSIS — J449 Chronic obstructive pulmonary disease, unspecified: Secondary | ICD-10-CM | POA: Diagnosis not present

## 2024-02-23 DIAGNOSIS — J9610 Chronic respiratory failure, unspecified whether with hypoxia or hypercapnia: Secondary | ICD-10-CM | POA: Diagnosis not present

## 2024-02-23 DIAGNOSIS — E1159 Type 2 diabetes mellitus with other circulatory complications: Secondary | ICD-10-CM | POA: Diagnosis not present

## 2024-02-23 DIAGNOSIS — I152 Hypertension secondary to endocrine disorders: Secondary | ICD-10-CM | POA: Diagnosis not present

## 2024-03-09 DIAGNOSIS — Z87442 Personal history of urinary calculi: Secondary | ICD-10-CM | POA: Diagnosis not present

## 2024-03-09 DIAGNOSIS — K449 Diaphragmatic hernia without obstruction or gangrene: Secondary | ICD-10-CM | POA: Diagnosis not present

## 2024-03-09 DIAGNOSIS — R822 Biliuria: Secondary | ICD-10-CM | POA: Diagnosis not present

## 2024-03-09 DIAGNOSIS — Z87891 Personal history of nicotine dependence: Secondary | ICD-10-CM | POA: Diagnosis not present

## 2024-03-09 DIAGNOSIS — K6289 Other specified diseases of anus and rectum: Secondary | ICD-10-CM | POA: Diagnosis not present

## 2024-03-09 DIAGNOSIS — Z992 Dependence on renal dialysis: Secondary | ICD-10-CM | POA: Diagnosis not present

## 2024-03-09 DIAGNOSIS — N132 Hydronephrosis with renal and ureteral calculous obstruction: Secondary | ICD-10-CM | POA: Diagnosis not present

## 2024-03-09 DIAGNOSIS — E119 Type 2 diabetes mellitus without complications: Secondary | ICD-10-CM | POA: Diagnosis not present

## 2024-03-09 DIAGNOSIS — R1114 Bilious vomiting: Secondary | ICD-10-CM | POA: Diagnosis not present

## 2024-03-09 DIAGNOSIS — R112 Nausea with vomiting, unspecified: Secondary | ICD-10-CM | POA: Diagnosis not present

## 2024-03-09 DIAGNOSIS — J449 Chronic obstructive pulmonary disease, unspecified: Secondary | ICD-10-CM | POA: Diagnosis not present

## 2024-03-09 DIAGNOSIS — M47816 Spondylosis without myelopathy or radiculopathy, lumbar region: Secondary | ICD-10-CM | POA: Diagnosis not present

## 2024-03-09 DIAGNOSIS — K5641 Fecal impaction: Secondary | ICD-10-CM | POA: Diagnosis not present

## 2024-03-09 DIAGNOSIS — I700 Atherosclerosis of aorta: Secondary | ICD-10-CM | POA: Diagnosis not present

## 2024-03-09 DIAGNOSIS — R109 Unspecified abdominal pain: Secondary | ICD-10-CM | POA: Diagnosis not present

## 2024-03-20 DIAGNOSIS — R911 Solitary pulmonary nodule: Secondary | ICD-10-CM | POA: Diagnosis not present

## 2024-03-20 DIAGNOSIS — J441 Chronic obstructive pulmonary disease with (acute) exacerbation: Secondary | ICD-10-CM | POA: Diagnosis not present

## 2024-03-20 DIAGNOSIS — J449 Chronic obstructive pulmonary disease, unspecified: Secondary | ICD-10-CM | POA: Diagnosis not present

## 2024-03-21 DIAGNOSIS — J9610 Chronic respiratory failure, unspecified whether with hypoxia or hypercapnia: Secondary | ICD-10-CM | POA: Diagnosis not present

## 2024-03-21 DIAGNOSIS — I152 Hypertension secondary to endocrine disorders: Secondary | ICD-10-CM | POA: Diagnosis not present

## 2024-03-21 DIAGNOSIS — E1159 Type 2 diabetes mellitus with other circulatory complications: Secondary | ICD-10-CM | POA: Diagnosis not present

## 2024-03-21 DIAGNOSIS — J449 Chronic obstructive pulmonary disease, unspecified: Secondary | ICD-10-CM | POA: Diagnosis not present

## 2024-04-20 DIAGNOSIS — J449 Chronic obstructive pulmonary disease, unspecified: Secondary | ICD-10-CM | POA: Diagnosis not present

## 2024-04-20 DIAGNOSIS — J441 Chronic obstructive pulmonary disease with (acute) exacerbation: Secondary | ICD-10-CM | POA: Diagnosis not present

## 2024-04-20 DIAGNOSIS — R911 Solitary pulmonary nodule: Secondary | ICD-10-CM | POA: Diagnosis not present

## 2024-04-21 DIAGNOSIS — J9610 Chronic respiratory failure, unspecified whether with hypoxia or hypercapnia: Secondary | ICD-10-CM | POA: Diagnosis not present

## 2024-04-21 DIAGNOSIS — I152 Hypertension secondary to endocrine disorders: Secondary | ICD-10-CM | POA: Diagnosis not present

## 2024-04-21 DIAGNOSIS — E1159 Type 2 diabetes mellitus with other circulatory complications: Secondary | ICD-10-CM | POA: Diagnosis not present

## 2024-04-21 DIAGNOSIS — J449 Chronic obstructive pulmonary disease, unspecified: Secondary | ICD-10-CM | POA: Diagnosis not present

## 2024-05-03 DIAGNOSIS — M7582 Other shoulder lesions, left shoulder: Secondary | ICD-10-CM | POA: Diagnosis not present

## 2024-05-03 DIAGNOSIS — Z23 Encounter for immunization: Secondary | ICD-10-CM | POA: Diagnosis not present

## 2024-05-03 DIAGNOSIS — J449 Chronic obstructive pulmonary disease, unspecified: Secondary | ICD-10-CM | POA: Diagnosis not present

## 2024-05-03 DIAGNOSIS — E785 Hyperlipidemia, unspecified: Secondary | ICD-10-CM | POA: Diagnosis not present

## 2024-05-03 DIAGNOSIS — I152 Hypertension secondary to endocrine disorders: Secondary | ICD-10-CM | POA: Diagnosis not present

## 2024-05-03 DIAGNOSIS — E1159 Type 2 diabetes mellitus with other circulatory complications: Secondary | ICD-10-CM | POA: Diagnosis not present

## 2024-05-03 DIAGNOSIS — I2510 Atherosclerotic heart disease of native coronary artery without angina pectoris: Secondary | ICD-10-CM | POA: Diagnosis not present

## 2024-05-03 DIAGNOSIS — E1169 Type 2 diabetes mellitus with other specified complication: Secondary | ICD-10-CM | POA: Diagnosis not present

## 2024-05-19 DIAGNOSIS — I152 Hypertension secondary to endocrine disorders: Secondary | ICD-10-CM | POA: Diagnosis not present

## 2024-05-19 DIAGNOSIS — E1159 Type 2 diabetes mellitus with other circulatory complications: Secondary | ICD-10-CM | POA: Diagnosis not present

## 2024-05-19 DIAGNOSIS — J449 Chronic obstructive pulmonary disease, unspecified: Secondary | ICD-10-CM | POA: Diagnosis not present

## 2024-05-19 DIAGNOSIS — J9610 Chronic respiratory failure, unspecified whether with hypoxia or hypercapnia: Secondary | ICD-10-CM | POA: Diagnosis not present

## 2024-05-20 DIAGNOSIS — R911 Solitary pulmonary nodule: Secondary | ICD-10-CM | POA: Diagnosis not present

## 2024-05-20 DIAGNOSIS — J449 Chronic obstructive pulmonary disease, unspecified: Secondary | ICD-10-CM | POA: Diagnosis not present

## 2024-05-20 DIAGNOSIS — J441 Chronic obstructive pulmonary disease with (acute) exacerbation: Secondary | ICD-10-CM | POA: Diagnosis not present

## 2024-05-22 DIAGNOSIS — R911 Solitary pulmonary nodule: Secondary | ICD-10-CM | POA: Diagnosis not present

## 2024-05-22 DIAGNOSIS — I2510 Atherosclerotic heart disease of native coronary artery without angina pectoris: Secondary | ICD-10-CM | POA: Diagnosis not present

## 2024-05-22 DIAGNOSIS — J439 Emphysema, unspecified: Secondary | ICD-10-CM | POA: Diagnosis not present

## 2024-05-22 DIAGNOSIS — R918 Other nonspecific abnormal finding of lung field: Secondary | ICD-10-CM | POA: Diagnosis not present

## 2024-06-09 DIAGNOSIS — Z09 Encounter for follow-up examination after completed treatment for conditions other than malignant neoplasm: Secondary | ICD-10-CM | POA: Diagnosis not present

## 2024-06-09 DIAGNOSIS — D124 Benign neoplasm of descending colon: Secondary | ICD-10-CM | POA: Diagnosis not present

## 2024-06-09 DIAGNOSIS — Z860101 Personal history of adenomatous and serrated colon polyps: Secondary | ICD-10-CM | POA: Diagnosis not present

## 2024-06-09 DIAGNOSIS — D125 Benign neoplasm of sigmoid colon: Secondary | ICD-10-CM | POA: Diagnosis not present

## 2024-06-09 DIAGNOSIS — D123 Benign neoplasm of transverse colon: Secondary | ICD-10-CM | POA: Diagnosis not present

## 2024-06-09 DIAGNOSIS — D122 Benign neoplasm of ascending colon: Secondary | ICD-10-CM | POA: Diagnosis not present

## 2024-06-20 DIAGNOSIS — E1159 Type 2 diabetes mellitus with other circulatory complications: Secondary | ICD-10-CM | POA: Diagnosis not present

## 2024-06-20 DIAGNOSIS — J9610 Chronic respiratory failure, unspecified whether with hypoxia or hypercapnia: Secondary | ICD-10-CM | POA: Diagnosis not present

## 2024-06-20 DIAGNOSIS — I152 Hypertension secondary to endocrine disorders: Secondary | ICD-10-CM | POA: Diagnosis not present

## 2024-06-20 DIAGNOSIS — J449 Chronic obstructive pulmonary disease, unspecified: Secondary | ICD-10-CM | POA: Diagnosis not present

## 2024-06-28 DIAGNOSIS — J449 Chronic obstructive pulmonary disease, unspecified: Secondary | ICD-10-CM | POA: Diagnosis not present

## 2024-06-28 DIAGNOSIS — F17211 Nicotine dependence, cigarettes, in remission: Secondary | ICD-10-CM | POA: Diagnosis not present

## 2024-06-28 DIAGNOSIS — I152 Hypertension secondary to endocrine disorders: Secondary | ICD-10-CM | POA: Diagnosis not present

## 2024-06-28 DIAGNOSIS — R911 Solitary pulmonary nodule: Secondary | ICD-10-CM | POA: Diagnosis not present

## 2024-06-28 DIAGNOSIS — E1159 Type 2 diabetes mellitus with other circulatory complications: Secondary | ICD-10-CM | POA: Diagnosis not present

## 2024-07-20 DIAGNOSIS — J441 Chronic obstructive pulmonary disease with (acute) exacerbation: Secondary | ICD-10-CM | POA: Diagnosis not present

## 2024-07-20 DIAGNOSIS — R911 Solitary pulmonary nodule: Secondary | ICD-10-CM | POA: Diagnosis not present

## 2024-07-20 DIAGNOSIS — J449 Chronic obstructive pulmonary disease, unspecified: Secondary | ICD-10-CM | POA: Diagnosis not present

## 2024-07-21 DIAGNOSIS — E1159 Type 2 diabetes mellitus with other circulatory complications: Secondary | ICD-10-CM | POA: Diagnosis not present

## 2024-07-21 DIAGNOSIS — J9610 Chronic respiratory failure, unspecified whether with hypoxia or hypercapnia: Secondary | ICD-10-CM | POA: Diagnosis not present

## 2024-07-21 DIAGNOSIS — I152 Hypertension secondary to endocrine disorders: Secondary | ICD-10-CM | POA: Diagnosis not present

## 2024-07-21 DIAGNOSIS — J449 Chronic obstructive pulmonary disease, unspecified: Secondary | ICD-10-CM | POA: Diagnosis not present
